# Patient Record
Sex: Female | Born: 1955
Health system: Southern US, Community
[De-identification: ages and names within clinical notes are randomized; demographics above are authoritative.]

## PROBLEM LIST (undated history)

## (undated) DIAGNOSIS — R739 Hyperglycemia, unspecified: Secondary | ICD-10-CM

## (undated) DIAGNOSIS — R319 Hematuria, unspecified: Secondary | ICD-10-CM

## (undated) DIAGNOSIS — N946 Dysmenorrhea, unspecified: Secondary | ICD-10-CM

## (undated) DIAGNOSIS — M8580 Other specified disorders of bone density and structure, unspecified site: Secondary | ICD-10-CM

## (undated) DIAGNOSIS — C4490 Unspecified malignant neoplasm of skin, unspecified: Secondary | ICD-10-CM

## (undated) DIAGNOSIS — D259 Leiomyoma of uterus, unspecified: Secondary | ICD-10-CM

## (undated) DIAGNOSIS — M810 Age-related osteoporosis without current pathological fracture: Secondary | ICD-10-CM

## (undated) DIAGNOSIS — Z124 Encounter for screening for malignant neoplasm of cervix: Secondary | ICD-10-CM

## (undated) DIAGNOSIS — E785 Hyperlipidemia, unspecified: Secondary | ICD-10-CM

## (undated) DIAGNOSIS — I10 Essential (primary) hypertension: Secondary | ICD-10-CM

## (undated) DIAGNOSIS — H6120 Impacted cerumen, unspecified ear: Secondary | ICD-10-CM

## (undated) DIAGNOSIS — T7840XA Allergy, unspecified, initial encounter: Secondary | ICD-10-CM

## (undated) HISTORY — DX: Encounter for screening for malignant neoplasm of cervix: Z12.4

## (undated) HISTORY — DX: Hyperglycemia, unspecified: R73.9

## (undated) HISTORY — DX: Unspecified malignant neoplasm of skin, unspecified: C44.90

## (undated) HISTORY — DX: Essential (primary) hypertension: I10

## (undated) HISTORY — PX: TONSILLECTOMY: SUR1361

## (undated) HISTORY — DX: Impacted cerumen, unspecified ear: H61.20

## (undated) HISTORY — DX: Hematuria, unspecified: R31.9

## (undated) HISTORY — PX: BREAST SURGERY: SHX581

## (undated) HISTORY — DX: Other specified disorders of bone density and structure, unspecified site: M85.80

## (undated) HISTORY — DX: Hyperlipidemia, unspecified: E78.5

## (undated) HISTORY — DX: Allergy, unspecified, initial encounter: T78.40XA

## (undated) HISTORY — DX: Leiomyoma of uterus, unspecified: D25.9

## (undated) HISTORY — DX: Dysmenorrhea, unspecified: N94.6

## (undated) HISTORY — DX: Age-related osteoporosis without current pathological fracture: M81.0

## (undated) HISTORY — PX: GYNECOLOGIC CRYOSURGERY: SHX857

## (undated) HISTORY — PX: COLPOSCOPY: SHX161

---
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
## 1998-07-26 ENCOUNTER — Ambulatory Visit (HOSPITAL_COMMUNITY): Admission: RE | Admit: 1998-07-26 | Discharge: 1998-07-26 | Payer: Self-pay | Admitting: Urology

## 1999-08-31 ENCOUNTER — Other Ambulatory Visit: Admission: RE | Admit: 1999-08-31 | Discharge: 1999-08-31 | Payer: Self-pay | Admitting: Obstetrics & Gynecology

## 2000-10-31 ENCOUNTER — Other Ambulatory Visit: Admission: RE | Admit: 2000-10-31 | Discharge: 2000-10-31 | Payer: Self-pay | Admitting: Obstetrics and Gynecology

## 2001-12-11 ENCOUNTER — Other Ambulatory Visit: Admission: RE | Admit: 2001-12-11 | Discharge: 2001-12-11 | Payer: Self-pay | Admitting: Obstetrics and Gynecology

## 2003-04-01 ENCOUNTER — Other Ambulatory Visit: Admission: RE | Admit: 2003-04-01 | Discharge: 2003-04-01 | Payer: Self-pay | Admitting: Obstetrics and Gynecology

## 2004-09-27 ENCOUNTER — Other Ambulatory Visit: Admission: RE | Admit: 2004-09-27 | Discharge: 2004-09-27 | Payer: Self-pay | Admitting: Obstetrics and Gynecology

## 2005-12-25 ENCOUNTER — Other Ambulatory Visit: Admission: RE | Admit: 2005-12-25 | Discharge: 2005-12-25 | Payer: Self-pay | Admitting: Obstetrics and Gynecology

## 2006-04-05 LAB — HM COLONOSCOPY

## 2008-10-05 ENCOUNTER — Encounter: Payer: Self-pay | Admitting: Internal Medicine

## 2009-01-08 ENCOUNTER — Ambulatory Visit: Payer: Self-pay | Admitting: *Deleted

## 2009-01-22 ENCOUNTER — Ambulatory Visit: Payer: Self-pay | Admitting: *Deleted

## 2009-08-08 ENCOUNTER — Encounter: Payer: Self-pay | Admitting: Internal Medicine

## 2010-01-21 LAB — CONVERTED CEMR LAB: Pap Smear: NORMAL

## 2010-02-07 LAB — HM PAP SMEAR: HM Pap smear: NORMAL

## 2010-03-07 ENCOUNTER — Ambulatory Visit: Payer: Self-pay | Admitting: Internal Medicine

## 2010-03-07 ENCOUNTER — Telehealth: Payer: Self-pay | Admitting: Internal Medicine

## 2010-03-07 DIAGNOSIS — Z85828 Personal history of other malignant neoplasm of skin: Secondary | ICD-10-CM | POA: Insufficient documentation

## 2010-03-07 DIAGNOSIS — D259 Leiomyoma of uterus, unspecified: Secondary | ICD-10-CM | POA: Insufficient documentation

## 2010-03-07 DIAGNOSIS — R030 Elevated blood-pressure reading, without diagnosis of hypertension: Secondary | ICD-10-CM | POA: Insufficient documentation

## 2010-04-11 ENCOUNTER — Ambulatory Visit: Payer: Self-pay | Admitting: Internal Medicine

## 2010-04-11 DIAGNOSIS — M810 Age-related osteoporosis without current pathological fracture: Secondary | ICD-10-CM | POA: Insufficient documentation

## 2010-04-11 HISTORY — DX: Age-related osteoporosis without current pathological fracture: M81.0

## 2010-05-18 ENCOUNTER — Telehealth: Payer: Self-pay | Admitting: Internal Medicine

## 2010-11-15 NOTE — Assessment & Plan Note (Signed)
Summary: new cpx/mhf   Vital Signs:  Patient profile:   55 year old female Height:      63 inches Weight:      135.50 pounds BMI:     24.09 Temp:     98.1 degrees F oral Pulse rate:   84 / minute Pulse rhythm:   regular Resp:     18 per minute BP sitting:   149 / 85  (left arm) Cuff size:   regular  Vitals Entered By: Glendell Docker CMA (Mar 07, 2010 9:29 AM) CC: Rm 2- New Patient    Primary Care Provider:  Dondra Spry DO  CC:  Rm 2- New Patient .  History of Present Illness: 55 y/o white female to establish and for routine cpx borderline elevation in blood pressure, discuss uterine fibroids- Arther Abbott , Candice Camp advised surgery- , fasting for blood work  uterine fibroids - they have continued to grow over last several years hysterectomy  -  laproscopic surgery not feasible no heavy bleeding no urinary symptoms  second opinion from GYN - Dr. Shawnie Pons.  he suggest watchful waiting  got taken off birthcontrol 1-2 yrs ago and took HRT with lower estrogen content denies hot flashes  Preventive Screening-Counseling & Management  Alcohol-Tobacco     Alcohol drinks/day: <1     Alcohol type: wine     Smoking Status: never  Caffeine-Diet-Exercise     Caffeine use/day: 2 cups coffee daily     Does Patient Exercise: yes     Times/week: 3  Allergies (verified): No Known Drug Allergies  Past History:  Past Medical History: Skin cancer, hx of Hypertension Uterine fibroids Osteopenia  Past Surgical History: Breast Biopsy ? 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    Physical Exam  General:  alert, well-developed, and well-nourished.   Head:  normocephalic and atraumatic.   Ears:  R ear normal and L ear normal.   Mouth:  pharynx pink and moist and fair dentition.   Neck:  No deformities, masses, or tenderness noted.no carotid bruits.   Lungs:  Normal respiratory effort, chest expands symmetrically. Lungs are clear to auscultation, no crackles or wheezes. Heart:  Normal rate and regular rhythm. S1 and S2 normal without gallop, murmur, click, rub or other extra sounds. Abdomen:  soft and non-tender.  palpable uterine fibroid Extremities:  No lower extremity edema  Neurologic:  cranial nerves II-XII intact and gait normal.   Psych:  normally interactive, good eye contact, not anxious appearing, and not depressed appearing.     Impression & Recommendations:  Problem # 1:  HEALTH MAINTENANCE EXAM (ICD-V70.0) Reviewed adult health maintenance protocols.  Mammogram: normal (12/07/2009) Pap smear: normal (01/21/2010) Colonoscopy: Done (04/05/2006) Bone Density: abnormal-Osteopenia (09/01/2008) Td Booster: Historical (10/14/2008)     Problem # 2:  ELEVATED BLOOD PRESSURE WITHOUT DIAGNOSIS OF HYPERTENSION (ICD-796.2)  BP  today: 149/85  Instructed in low sodium diet and behavior modification.    Problem # 3:  FIBROIDS, UTERUS (ICD-218.9) Pt to try stopping HRT.   She will f/u GYN within 6 months to reassess - pelvic u/s, then decide whether she will have hysterectomy  Complete Medication List: 1)  Citracal Plus Tabs (Multiple minerals-vitamins) .... Take 1 tablet by mouth once a day 2)  Boniva 150 Mg  Tabs (Ibandronate sodium) .... One tablet by mouth once monthly  Patient Instructions: 1)  Please schedule a follow-up appointment in 6 weeks. 2)  Keep log of BP readings - 10 to 15 readings before your next appt   Preventive Care Screening  Pap Smear:    Date:  01/21/2010    Results:  normal   Mammogram:    Date:  12/07/2009    Results:  normal   Last Tetanus Booster:    Date:  10/14/2008    Results:  Historical   Bone Density:    Date:  09/01/2008    Results:  abnormal-Osteopenia std dev  Colonoscopy:    Date:  04/05/2006    Results:  Done    Current Allergies (reviewed today): No known allergies

## 2010-11-15 NOTE — Progress Notes (Signed)
Summary: BP Log Brought by Patient  BP Log Brought by Patient   Imported By: Lanelle Bal 04/19/2010 10:24:05  _____________________________________________________________________  External Attachment:    Type:   Image     Comment:   External Document

## 2010-11-15 NOTE — Progress Notes (Signed)
Summary: request to [facility] for Medical Records   Phone Note Outgoing Call   Call placed by: Marj Call placed to: [facility]  Summary of Call: form faxed to [facility] to get medical records  Initial call taken by: Roselle Locus,  Mar 07, 2010 9:37 AM

## 2010-11-15 NOTE — Progress Notes (Signed)
Summary: Swollen eye  Phone Note Call from Patient Call back at [phone]   Caller: Patient Reason for Call: Talk to Nurse Summary of Call: Pt not sure if she needs OV with [place] or with eye doc, her eye is swollen & painful but is not pink and doesn't have drainage typical of pink eye, pls call her to advise Initial call taken by: Lannette Donath,  May 18, 2010 9:18 AM  Follow-up for Phone Call        call returned to patient  she states she has remedy her eye problem,  she waas able to get a appointment with her eye doctor this afternoon.  Follow-up by: Glendell Docker CMA,  May 18, 2010 10:41 AM

## 2010-11-15 NOTE — Letter (Signed)
Summary: [facility]   Imported By: Lanelle Bal 03/29/2010 11:33:58  _____________________________________________________________________  External Attachment:    Type:   Image     Comment:   External Document

## 2010-11-15 NOTE — Assessment & Plan Note (Signed)
Summary: 6 week follow up/mhf   Vital Signs:  Patient profile:   55 year old female Height:      63 inches Weight:      135.25 pounds BMI:     24.05 O2 Sat:      98 % on Room air Temp:     97.9 degrees F oral Pulse rate:   94 / minute Pulse rhythm:   regular Resp:     18 per minute BP sitting:   126 / 80  (right arm) Cuff size:   regular  Vitals Entered By: Glendell Docker CMA (April 11, 2010 8:04 AM)  O2 Flow:  Room air CC: Rm 2- 6 Week Follow up Comments trouble staying asleep during the night,3 episodes in the past week, medications reviewed   Primary Care Provider:  DThomos Lemons DO  CC:  Rm 2- 6 Week Follow up.  History of Present Illness: 55 y/o with elevated BP for f/u home BP readings normal - 14 readings avg 115/80 last visit, pt had root canal and took some NSAIDs denies any other supplement use  stopped HRT no hot flashes but noticed intermittent insomnia no obvious trigger  reviewed health screening medical records DEXA scan 2010 - left femoral neck -1.5, lumbar -1.9  Allergies (verified): No Known Drug Allergies  Past History:  Past Medical History: Skin cancer, hx of Hypertension Uterine fibroids   Osteopenia chronic cerumen impaction  Past Surgical History: Breast Biopsy ? 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   Ears:  bilateral cerumen  Lungs:  Normal respiratory effort, chest expands symmetrically. Lungs are clear to  auscultation, no crackles or wheezes. Heart:  Normal rate and regular rhythm. S1 and S2 normal without gallop, murmur, click, rub or other extra sounds.   Impression & Recommendations:  Problem # 1:  ELEVATED BLOOD PRESSURE WITHOUT DIAGNOSIS OF HYPERTENSION (ICD-796.2) Assessment Improved prev bp elevation may have been related to NSAID use.  home readings normal  BP today: 126/80 Prior BP: 149/85 (03/07/2010)  Problem # 2:  OSTEOPENIA (ICD-733.90) DEXA scan 2010 - left femoral neck -1.5, lumbar -1.9 Her updated medication list for this problem includes:    Boniva 150 Mg Tabs (Ibandronate sodium) ..... One tablet by mouth once monthly  continue bisphosphonate and calcium and vit d supplement  Complete Medication List: 1)  Citracal Plus Tabs (Multiple minerals-vitamins) .... Take 1 tablet by mouth once a day 2)  Boniva 150 Mg Tabs (Ibandronate sodium) .... One tablet by mouth once monthly  Patient Instructions: 1)  Please schedule a follow-up appointment in 1 year.  Current Allergies (reviewed today): No known allergies

## 2010-11-15 NOTE — Letter (Signed)
Summary: Health Screening/National Health & Nutrition Exam  Health Screening/National Health & Nutrition Exam   Imported By: Lanelle Bal 04/19/2010 10:23:17  _____________________________________________________________________  External Attachment:    Type:   Image     Comment:   External Document

## 2011-03-29 ENCOUNTER — Telehealth: Payer: Self-pay | Admitting: Internal Medicine

## 2011-03-29 NOTE — Telephone Encounter (Signed)
She wants to know if she should keep taking the boniva and calcium supplements with vitamin d every.  The new study says this increases the risk of heart attacks.  She is not taking hormone replacement therapy and she is having increased hot flash.  Should she take some hormone replacement therapy.

## 2011-04-07 NOTE — Telephone Encounter (Signed)
I don't know of any cardiac risks with boniva. As far as recommending anything about her meds, I know nothing that would make me advise her to stop taking them at this time.  She has not followed up in a year.  Maybe she would want to come in to discuss her osteoporosis, menopause, and her treatment plan....she could also bring in the article she is referring to so I could read it and possibly provide clarity.  Thanks--PM

## 2011-04-07 NOTE — Telephone Encounter (Signed)
Call placed to patient at [phone], no answer. A detailed voice message was left informing patient per Dr Milinda Cave instructions. Patient advised to call back to schedule office visit

## 2011-06-06 ENCOUNTER — Ambulatory Visit (INDEPENDENT_AMBULATORY_CARE_PROVIDER_SITE_OTHER): Payer: 59 | Admitting: Internal Medicine

## 2011-06-06 ENCOUNTER — Encounter: Payer: Self-pay | Admitting: Internal Medicine

## 2011-06-06 VITALS — BP 118/68 | HR 91 | Temp 98.1°F | Resp 16 | Ht 63.0 in | Wt 132.0 lb

## 2011-06-06 DIAGNOSIS — R109 Unspecified abdominal pain: Secondary | ICD-10-CM | POA: Insufficient documentation

## 2011-06-06 LAB — HEPATIC FUNCTION PANEL
AST: 22 U/L (ref 0–37)
Albumin: 4.3 g/dL (ref 3.5–5.2)
Bilirubin, Direct: 0.1 mg/dL (ref 0.0–0.3)
Total Bilirubin: 0.6 mg/dL (ref 0.3–1.2)

## 2011-06-06 LAB — CBC WITH DIFFERENTIAL/PLATELET
Basophils Absolute: 0 10*3/uL (ref 0.0–0.1)
Basophils Relative: 0 % (ref 0–1)
Eosinophils Absolute: 0 10*3/uL (ref 0.0–0.7)
MCH: 31.1 pg (ref 26.0–34.0)
MCHC: 33 g/dL (ref 30.0–36.0)
Monocytes Relative: 8 % (ref 3–12)
Neutro Abs: 3.7 10*3/uL (ref 1.7–7.7)
Neutrophils Relative %: 76 % (ref 43–77)
RDW: 12.5 % (ref 11.5–15.5)

## 2011-06-06 LAB — BASIC METABOLIC PANEL
BUN: 15 mg/dL (ref 6–23)
Calcium: 9.4 mg/dL (ref 8.4–10.5)
Glucose, Bld: 104 mg/dL — ABNORMAL HIGH (ref 70–99)
Potassium: 4 mEq/L (ref 3.5–5.3)
Sodium: 142 mEq/L (ref 135–145)

## 2011-06-06 LAB — URINALYSIS, ROUTINE W REFLEX MICROSCOPIC
Leukocytes, UA: NEGATIVE
Nitrite: NEGATIVE
Specific Gravity, Urine: 1.023 (ref 1.005–1.030)
pH: 5 (ref 5.0–8.0)

## 2011-06-06 LAB — LIPASE: Lipase: 14 U/L (ref 0–75)

## 2011-06-06 LAB — AMYLASE: Amylase: 48 U/L (ref 0–105)

## 2011-06-06 NOTE — Progress Notes (Signed)
  Subjective:    Patient ID: Yolanda Hernandez, female    DOB: October 10, 1956, 55 y.o.   MRN: [id number]  HPI Pt presents to clinic as a work in for evaluation of  Abdominal discomfort. Notes intermitent periumbilical to hypogastric abdomina pain without radiation or relationship to food. No associated n/v/d. +fatigue, possible LG temp (99-100 orally) and decreased appetite. Sx's persist for a few days then resolve spontaneously. Has had 2 similar episodes and the first involved mild lbp as well. Denies urinary sx' such as frequency, hematuria, urgency or dysuria. No other exacerbating or alleviating factors. Taking no medications for this problem. No other complaints.  Past Medical History  Diagnosis Date  . Skin cancer   . Hypertension   . Uterine fibroid   . Osteopenia   . Cerumen impaction     chronic   Past Surgical History  Procedure Date  . Breast surgery     biopsy left  . Myringoplasty 1972  . Tonsillectomy     reports that she has never smoked. She has never used smokeless tobacco. She reports that she drinks alcohol. She reports that she does not use illicit drugs. family history includes Alcohol abuse in her other; Diabetes in her father and sister; Heart disease in her father; Hyperlipidemia in her father and mother; Hypertension in her father and mother; Kidney disease in her other; and Osteoporosis in her mother. No Known Allergies   Review of Systems see hpi     Objective:   Physical Exam  Nursing note and vitals reviewed. Constitutional: She appears well-developed and well-nourished. No distress.  HENT:  Head: Normocephalic and atraumatic.  Eyes: Conjunctivae are normal. No scleral icterus.  Abdominal: Soft. Bowel sounds are normal. She exhibits no distension and no mass. There is no hepatosplenomegaly. There is tenderness. There is no rebound and no guarding.       Mild discomfort to palpation hypogastric area.  Skin: She is not diaphoretic.          Assessment  & Plan:

## 2011-06-06 NOTE — Assessment & Plan Note (Signed)
Obtain cbc, chem7, lft, amylase, lipase, and UA. Ten day empiric course of ppi with samples of nexium 40mg  po qd provided. Followup if no improvement or worsening.

## 2011-06-07 ENCOUNTER — Telehealth: Payer: Self-pay | Admitting: *Deleted

## 2011-06-07 NOTE — Telephone Encounter (Signed)
Patient called and left voice message stating she was seen yesterday , and since then she has had 7 bouts of  Clay colored diarrhea.   Call was returned to patient at [phone]. She states she woke up with diarrhea, and has had it several times throughout the day. She denies having any other symptoms.  She wanted to know if her labs were back. She was informed Dr Rodena Medin was out of the office this afternoon, and that I would call her in the morning with his recommendations. She was advised to use immodium over the counter as package directs for relief. Patient has verbalized understanding and agrees.

## 2011-06-08 MED ORDER — METRONIDAZOLE 500 MG PO TABS: 500.0000 mg | ORAL_TABLET | Freq: Three times a day (TID) | ORAL | Status: AC

## 2011-06-08 NOTE — Telephone Encounter (Signed)
Labs nl. Can take brief course of flagyl 500mg  tid x 5days. otc probiotic. However still recommend gi if sx's persist

## 2011-06-08 NOTE — Telephone Encounter (Signed)
Call returned from patient, voice message left stating should would like Rx sent to [facility]. Her message stated that she has never used a probiotic before and that she will check with the pharmacist for over the counter suggestion.

## 2011-06-08 NOTE — Telephone Encounter (Signed)
Call placed to patient at [phone], no answer. A detailed voice message was left informing patient per Dr Rodena Medin instruction. Message was left for patient to return call regarding where she would like the Rx sent to.

## 2011-06-09 ENCOUNTER — Ambulatory Visit: Payer: Self-pay | Admitting: Internal Medicine

## 2011-08-23 ENCOUNTER — Telehealth: Payer: Self-pay | Admitting: Internal Medicine

## 2011-08-23 DIAGNOSIS — Z1329 Encounter for screening for other suspected endocrine disorder: Secondary | ICD-10-CM

## 2011-08-23 DIAGNOSIS — Z13220 Encounter for screening for lipoid disorders: Secondary | ICD-10-CM

## 2011-08-23 DIAGNOSIS — Z0000 Encounter for general adult medical examination without abnormal findings: Secondary | ICD-10-CM

## 2011-08-23 DIAGNOSIS — M899 Disorder of bone, unspecified: Secondary | ICD-10-CM

## 2011-08-23 NOTE — Telephone Encounter (Signed)
Lab orders entered for [facility]. Call placed to patient at [phone], no answer. A voice message was left for patient to return phone call.

## 2011-08-23 NOTE — Telephone Encounter (Signed)
Had extensive labs 8/21. There is already an order for future labs for the remainder of what would be needed

## 2011-08-23 NOTE — Telephone Encounter (Signed)
She is scheduled for cpe on 11-12 with Dr Rodena Medin.  Would like to do labs prior to appt.  Please advise patient when order is in place

## 2011-08-23 NOTE — Telephone Encounter (Signed)
Dr Rodena Medin what labs will be needed for November Physical?

## 2011-08-24 NOTE — Telephone Encounter (Signed)
Patient returned phone call and left voice to contact her at [phone]. Call was returned to patient at requested phone number. She was informed of fasting blood work. She stated that she will have labs drawn on Monday.

## 2011-08-29 LAB — LIPID PANEL
Cholesterol: 208 mg/dL — ABNORMAL HIGH (ref 0–200)
HDL: 48 mg/dL (ref >39)
Triglycerides: 117 mg/dL (ref <150)

## 2011-08-30 LAB — URINALYSIS, MICROSCOPIC ONLY
Bacteria, UA: NONE SEEN
Squamous Epithelial / LPF: NONE SEEN

## 2011-08-30 LAB — URINALYSIS, ROUTINE W REFLEX MICROSCOPIC
Bilirubin Urine: NEGATIVE
Ketones, ur: NEGATIVE mg/dL
Specific Gravity, Urine: 1.013 (ref 1.005–1.030)
pH: 7.5 (ref 5.0–8.0)

## 2011-09-04 ENCOUNTER — Encounter: Payer: Self-pay | Admitting: Internal Medicine

## 2011-09-04 ENCOUNTER — Ambulatory Visit (INDEPENDENT_AMBULATORY_CARE_PROVIDER_SITE_OTHER): Payer: 59 | Admitting: Internal Medicine

## 2011-09-04 VITALS — BP 126/80 | HR 94 | Temp 97.8°F | Resp 18 | Ht 63.0 in | Wt 133.0 lb

## 2011-09-04 DIAGNOSIS — M21959 Unspecified acquired deformity of unspecified thigh: Secondary | ICD-10-CM

## 2011-09-04 DIAGNOSIS — M25659 Stiffness of unspecified hip, not elsewhere classified: Secondary | ICD-10-CM

## 2011-09-04 DIAGNOSIS — Z0000 Encounter for general adult medical examination without abnormal findings: Secondary | ICD-10-CM

## 2011-09-04 NOTE — Progress Notes (Signed)
  Subjective:    Patient ID: Yolanda Hernandez, female    DOB: 11-15-1955, 55 y.o.   MRN: [id number]  HPI Pt presents to clinic for annual exam. utd with pap smears and bmd through gyn. Notes 24m h/o mildly decreased ROM of right hip flexor. No associated pain or injury. No other complaints.  Past Medical History  Diagnosis Date  . Skin cancer   . Hypertension   . Uterine fibroid   . Osteopenia   . Cerumen impaction     chronic   Past Surgical History  Procedure Date  . Breast surgery     biopsy left  . Myringoplasty 1972  . Tonsillectomy     reports that she has never smoked. She has never used smokeless tobacco. She reports that she drinks alcohol. She reports that she does not use illicit drugs. family history includes Alcohol abuse in her other; Diabetes in her father and sister; Heart disease in her father; Hyperlipidemia in her father and mother; Hypertension in her father and mother; Kidney disease in her other; and Osteoporosis in her mother. No Known Allergies     Review of Systems see hpi     Objective:   Physical Exam  Nursing note and vitals reviewed. Constitutional: She appears well-developed and well-nourished. No distress.  HENT:  Head: Normocephalic and atraumatic.  Right Ear: Tympanic membrane, external ear and ear canal normal.  Left Ear: Tympanic membrane, external ear and ear canal normal.  Nose: Nose normal.  Mouth/Throat: Oropharynx is clear and moist. No oropharyngeal exudate.  Eyes: Conjunctivae and EOM are normal. Pupils are equal, round, and reactive to light. Right eye exhibits no discharge. Left eye exhibits no discharge. No scleral icterus.  Neck: Neck supple. No JVD present. No thyromegaly present.  Cardiovascular: Normal rate, regular rhythm, normal heart sounds and intact distal pulses.  Exam reveals no gallop and no friction rub.   No murmur heard. Pulmonary/Chest: Effort normal and breath sounds normal. No respiratory distress. She has no  wheezes. She has no rales.  Abdominal: Soft. Normal appearance and bowel sounds are normal. She exhibits no distension and no mass. There is no hepatosplenomegaly. There is no tenderness. There is no rebound and no guarding.  Musculoskeletal: Normal range of motion.  Lymphadenopathy:    She has no cervical adenopathy.  Neurological: She is alert.  Skin: Skin is warm and dry. No rash noted. She is not diaphoretic.  Psychiatric: She has a normal mood and affect.          Assessment & Plan:

## 2011-09-04 NOTE — Patient Instructions (Signed)
Please schedule fasting lipid prior to next visit

## 2011-09-05 ENCOUNTER — Ambulatory Visit: Payer: 59 | Attending: Internal Medicine | Admitting: Physical Therapy

## 2011-09-05 DIAGNOSIS — IMO0001 Reserved for inherently not codable concepts without codable children: Secondary | ICD-10-CM | POA: Insufficient documentation

## 2011-09-05 DIAGNOSIS — M25559 Pain in unspecified hip: Secondary | ICD-10-CM | POA: Insufficient documentation

## 2011-09-05 DIAGNOSIS — M25659 Stiffness of unspecified hip, not elsewhere classified: Secondary | ICD-10-CM | POA: Insufficient documentation

## 2011-09-08 DIAGNOSIS — Z0000 Encounter for general adult medical examination without abnormal findings: Secondary | ICD-10-CM

## 2011-09-08 DIAGNOSIS — M25659 Stiffness of unspecified hip, not elsewhere classified: Secondary | ICD-10-CM | POA: Insufficient documentation

## 2011-09-08 HISTORY — DX: Encounter for general adult medical examination without abnormal findings: Z00.00

## 2011-09-08 NOTE — Assessment & Plan Note (Signed)
PT referral.

## 2011-09-08 NOTE — Assessment & Plan Note (Signed)
Nl exam. Colonoscopy pending. Labs reviewed. Low fat diet/exercise.

## 2011-09-15 ENCOUNTER — Ambulatory Visit: Payer: 59 | Admitting: Physical Therapy

## 2011-09-20 ENCOUNTER — Ambulatory Visit: Payer: 59 | Admitting: Physical Therapy

## 2011-09-26 ENCOUNTER — Ambulatory Visit: Payer: 59 | Attending: Internal Medicine | Admitting: Physical Therapy

## 2011-09-26 DIAGNOSIS — IMO0001 Reserved for inherently not codable concepts without codable children: Secondary | ICD-10-CM | POA: Insufficient documentation

## 2011-09-26 DIAGNOSIS — M25559 Pain in unspecified hip: Secondary | ICD-10-CM | POA: Insufficient documentation

## 2011-09-26 DIAGNOSIS — M25659 Stiffness of unspecified hip, not elsewhere classified: Secondary | ICD-10-CM | POA: Insufficient documentation

## 2011-10-03 ENCOUNTER — Ambulatory Visit: Payer: 59 | Admitting: Physical Therapy

## 2011-11-01 LAB — HM PAP SMEAR: HM Pap smear: NORMAL

## 2013-01-20 ENCOUNTER — Telehealth: Payer: Self-pay

## 2013-01-20 NOTE — Telephone Encounter (Signed)
Pt left a message stating that she would like a referral for bone density preferably somewhere in [place]? Pt stated she hasn't been seen since 09-04-11 ([place] wanted pt to return in 6 months). Pt stated on vm she would come in for an appt if needed.  Pt informed and states she would like to make an appt but then stated she was in the car she would call back to make on

## 2013-03-19 ENCOUNTER — Encounter: Payer: Self-pay | Admitting: Internal Medicine

## 2013-03-19 ENCOUNTER — Ambulatory Visit (INDEPENDENT_AMBULATORY_CARE_PROVIDER_SITE_OTHER): Payer: 59 | Admitting: Family Medicine

## 2013-03-19 ENCOUNTER — Encounter: Payer: Self-pay | Admitting: Family Medicine

## 2013-03-19 VITALS — BP 120/78 | HR 86 | Temp 98.2°F | Ht 63.0 in | Wt 133.0 lb

## 2013-03-19 DIAGNOSIS — Z0000 Encounter for general adult medical examination without abnormal findings: Secondary | ICD-10-CM

## 2013-03-19 DIAGNOSIS — R739 Hyperglycemia, unspecified: Secondary | ICD-10-CM

## 2013-03-19 DIAGNOSIS — M899 Disorder of bone, unspecified: Secondary | ICD-10-CM

## 2013-03-19 DIAGNOSIS — E785 Hyperlipidemia, unspecified: Secondary | ICD-10-CM

## 2013-03-19 DIAGNOSIS — IMO0001 Reserved for inherently not codable concepts without codable children: Secondary | ICD-10-CM

## 2013-03-19 DIAGNOSIS — R7309 Other abnormal glucose: Secondary | ICD-10-CM

## 2013-03-19 DIAGNOSIS — M8580 Other specified disorders of bone density and structure, unspecified site: Secondary | ICD-10-CM | POA: Insufficient documentation

## 2013-03-19 DIAGNOSIS — E782 Mixed hyperlipidemia: Secondary | ICD-10-CM | POA: Insufficient documentation

## 2013-03-19 DIAGNOSIS — R030 Elevated blood-pressure reading, without diagnosis of hypertension: Secondary | ICD-10-CM

## 2013-03-19 HISTORY — DX: Hyperglycemia, unspecified: R73.9

## 2013-03-19 HISTORY — DX: Hyperlipidemia, unspecified: E78.5

## 2013-03-19 LAB — HEPATIC FUNCTION PANEL
Bilirubin, Direct: 0.1 mg/dL (ref 0.0–0.3)
Indirect Bilirubin: 0.7 mg/dL (ref 0.0–0.9)
Total Bilirubin: 0.8 mg/dL (ref 0.3–1.2)

## 2013-03-19 LAB — CBC
Hemoglobin: 15.5 g/dL — ABNORMAL HIGH (ref 12.0–15.0)
MCH: 30.6 pg (ref 26.0–34.0)
Platelets: 356 10*3/uL (ref 150–400)
RBC: 5.07 MIL/uL (ref 3.87–5.11)

## 2013-03-19 LAB — RENAL FUNCTION PANEL
Albumin: 4.7 g/dL (ref 3.5–5.2)
Calcium: 9.6 mg/dL (ref 8.4–10.5)
Phosphorus: 3.1 mg/dL (ref 2.3–4.6)
Potassium: 4.5 mEq/L (ref 3.5–5.3)
Sodium: 142 mEq/L (ref 135–145)

## 2013-03-19 LAB — LIPID PANEL
Cholesterol: 222 mg/dL — ABNORMAL HIGH (ref 0–200)
Total CHOL/HDL Ratio: 4.3 Ratio
Triglycerides: 105 mg/dL (ref <150)
VLDL: 21 mg/dL (ref 0–40)

## 2013-03-19 LAB — HEMOGLOBIN A1C
Hgb A1c MFr Bld: 5.5 % (ref <5.7)
Mean Plasma Glucose: 111 mg/dL (ref <117)

## 2013-03-19 LAB — TSH: TSH: 3.141 u[IU]/mL (ref 0.350–4.500)

## 2013-03-19 NOTE — Patient Instructions (Addendum)
Start krill oil caps daily, Mega[employer]  Annual exam lipid, renal, cbc, hepatic, tsh, hgba1c, lipid  Preventive Care for Adults, Female A healthy lifestyle and preventive care can promote health and wellness. Preventive health guidelines for women include the following key practices.  A routine yearly physical is a good way to check with your caregiver about your health and preventive screening. It is a chance to share any concerns and updates on your health, and to receive a thorough exam.  Visit your dentist for a routine exam and preventive care every 6 months. Brush your teeth twice a day and floss once a day. Good oral hygiene prevents tooth decay and gum disease.  The frequency of eye exams is based on your age, health, family medical history, use of contact lenses, and other factors. Follow your caregiver's recommendations for frequency of eye exams.  Eat a healthy diet. Foods like vegetables, fruits, whole grains, low-fat dairy products, and lean protein foods contain the nutrients you need without too many calories. Decrease your intake of foods high in solid fats, added sugars, and salt. Eat the right amount of calories for you.Get information about a proper diet from your caregiver, if necessary.  Regular physical exercise is one of the most important things you can do for your health. Most adults should get at least 150 minutes of moderate-intensity exercise (any activity that increases your heart rate and causes you to sweat) each week. In addition, most adults need muscle-strengthening exercises on 2 or more days a week.  Maintain a healthy weight. The body mass index (BMI) is a screening tool to identify possible weight problems. It provides an estimate of body fat based on height and weight. Your caregiver can help determine your BMI, and can help you achieve or maintain a healthy weight.For adults 20 years and older:  A BMI below 18.5 is considered underweight.  A BMI of 18.5 to  24.9 is normal.  A BMI of 25 to 29.9 is considered overweight.  A BMI of 30 and above is considered obese.  Maintain normal blood lipids and cholesterol levels by exercising and minimizing your intake of saturated fat. Eat a balanced diet with plenty of fruit and vegetables. Blood tests for lipids and cholesterol should begin at age 35 and be repeated every 5 years. If your lipid or cholesterol levels are high, you are over 50, or you are at high risk for heart disease, you may need your cholesterol levels checked more frequently.Ongoing high lipid and cholesterol levels should be treated with medicines if diet and exercise are not effective.  If you smoke, find out from your caregiver how to quit. If you do not use tobacco, do not start.  If you are pregnant, do not drink alcohol. If you are breastfeeding, be very cautious about drinking alcohol. If you are not pregnant and choose to drink alcohol, do not exceed 1 drink per day. One drink is considered to be 12 ounces (355 mL) of beer, 5 ounces (148 mL) of wine, or 1.5 ounces (44 mL) of liquor.  Avoid use of street drugs. Do not share needles with anyone. Ask for help if you need support or instructions about stopping the use of drugs.  High blood pressure causes heart disease and increases the risk of stroke. Your blood pressure should be checked at least every 1 to 2 years. Ongoing high blood pressure should be treated with medicines if weight loss and exercise are not effective.  If you are 65  to 58 years old, ask your caregiver if you should take aspirin to prevent strokes.  Diabetes screening involves taking a blood sample to check your fasting blood sugar level. This should be done once every 3 years, after age 33, if you are within normal weight and without risk factors for diabetes. Testing should be considered at a younger age or be carried out more frequently if you are overweight and have at least 1 risk factor for diabetes.  Breast  cancer screening is essential preventive care for women. You should practice "breast self-awareness." This means understanding the normal appearance and feel of your breasts and may include breast self-examination. Any changes detected, no matter how small, should be reported to a caregiver. Women in their 64s and 30s should have a clinical breast exam (CBE) by a caregiver as part of a regular health exam every 1 to 3 years. After age 14, women should have a CBE every year. Starting at age 73, women should consider having a mammography (breast X-ray test) every year. Women who have a family history of breast cancer should talk to their caregiver about genetic screening. Women at a high risk of breast cancer should talk to their caregivers about having magnetic resonance imaging (MRI) and a mammography every year.  The Pap test is a screening test for cervical cancer. A Pap test can show cell changes on the cervix that might become cervical cancer if left untreated. A Pap test is a procedure in which cells are obtained and examined from the lower end of the uterus (cervix).  Women should have a Pap test starting at age 64.  Between ages 55 and 79, Pap tests should be repeated every 2 years.  Beginning at age 41, you should have a Pap test every 3 years as long as the past 3 Pap tests have been normal.  Some women have medical problems that increase the chance of getting cervical cancer. Talk to your caregiver about these problems. It is especially important to talk to your caregiver if a new problem develops soon after your last Pap test. In these cases, your caregiver may recommend more frequent screening and Pap tests.  The above recommendations are the same for women who have or have not gotten the vaccine for human papillomavirus (HPV).  If you had a hysterectomy for a problem that was not cancer or a condition that could lead to cancer, then you no longer need Pap tests. Even if you no longer need  a Pap test, a regular exam is a good idea to make sure no other problems are starting.  If you are between ages 73 and 38, and you have had normal Pap tests going back 10 years, you no longer need Pap tests. Even if you no longer need a Pap test, a regular exam is a good idea to make sure no other problems are starting.  If you have had past treatment for cervical cancer or a condition that could lead to cancer, you need Pap tests and screening for cancer for at least 20 years after your treatment.  If Pap tests have been discontinued, risk factors (such as a new sexual partner) need to be reassessed to determine if screening should be resumed.  The HPV test is an additional test that may be used for cervical cancer screening. The HPV test looks for the virus that can cause the cell changes on the cervix. The cells collected during the Pap test can be tested for  HPV. The HPV test could be used to screen women aged 25 years and older, and should be used in women of any age who have unclear Pap test results. After the age of 8, women should have HPV testing at the same frequency as a Pap test.  Colorectal cancer can be detected and often prevented. Most routine colorectal cancer screening begins at the age of 4 and continues through age 59. However, your caregiver may recommend screening at an earlier age if you have risk factors for colon cancer. On a yearly basis, your caregiver may provide home test kits to check for hidden blood in the stool. Use of a small camera at the end of a tube, to directly examine the colon (sigmoidoscopy or colonoscopy), can detect the earliest forms of colorectal cancer. Talk to your caregiver about this at age 61, when routine screening begins. Direct examination of the colon should be repeated every 5 to 10 years through age 37, unless early forms of pre-cancerous polyps or small growths are found.  Hepatitis C blood testing is recommended for all people born from [birth year]  through 1965 and any individual with known risks for hepatitis C.  Practice safe sex. Use condoms and avoid high-risk sexual practices to reduce the spread of sexually transmitted infections (STIs). STIs include gonorrhea, chlamydia, syphilis, trichomonas, herpes, HPV, and human immunodeficiency virus (HIV). Herpes, HIV, and HPV are viral illnesses that have no cure. They can result in disability, cancer, and death. Sexually active women aged 8 and younger should be checked for chlamydia. Older women with new or multiple partners should also be tested for chlamydia. Testing for other STIs is recommended if you are sexually active and at increased risk.  Osteoporosis is a disease in which the bones lose minerals and strength with aging. This can result in serious bone fractures. The risk of osteoporosis can be identified using a bone density scan. Women ages 4 and over and women at risk for fractures or osteoporosis should discuss screening with their caregivers. Ask your caregiver whether you should take a calcium supplement or vitamin D to reduce the rate of osteoporosis.  Menopause can be associated with physical symptoms and risks. Hormone replacement therapy is available to decrease symptoms and risks. You should talk to your caregiver about whether hormone replacement therapy is right for you.  Use sunscreen with sun protection factor (SPF) of 30 or more. Apply sunscreen liberally and repeatedly throughout the day. You should seek shade when your shadow is shorter than you. Protect yourself by wearing long sleeves, pants, a wide-brimmed hat, and sunglasses year round, whenever you are outdoors.  Once a month, do a whole body skin exam, using a mirror to look at the skin on your back. Notify your caregiver of new moles, moles that have irregular borders, moles that are larger than a pencil eraser, or moles that have changed in shape or color.  Stay current with required immunizations.  Influenza.  You need a dose every fall (or winter). The composition of the flu vaccine changes each year, so being vaccinated once is not enough.  Pneumococcal polysaccharide. You need 1 to 2 doses if you smoke cigarettes or if you have certain chronic medical conditions. You need 1 dose at age 47 (or older) if you have never been vaccinated.  Tetanus, diphtheria, pertussis (Tdap, Td). Get 1 dose of Tdap vaccine if you are younger than age 71, are over 41 and have contact with an infant, are a Research scientist (physical sciences),  are pregnant, or simply want to be protected from whooping cough. After that, you need a Td booster dose every 10 years. Consult your caregiver if you have not had at least 3 tetanus and diphtheria-containing shots sometime in your life or have a deep or dirty wound.  HPV. You need this vaccine if you are a woman age 7 or younger. The vaccine is given in 3 doses over 6 months.  Measles, mumps, rubella (MMR). You need at least 1 dose of MMR if you were born in 1957 or later. You may also need a second dose.  Meningococcal. If you are age 73 to 51 and a first-year college student living in a residence hall, or have one of several medical conditions, you need to get vaccinated against meningococcal disease. You may also need additional booster doses.  Zoster (shingles). If you are age 30 or older, you should get this vaccine.  Varicella (chickenpox). If you have never had chickenpox or you were vaccinated but received only 1 dose, talk to your caregiver to find out if you need this vaccine.  Hepatitis A. You need this vaccine if you have a specific risk factor for hepatitis A virus infection or you simply wish to be protected from this disease. The vaccine is usually given as 2 doses, 6 to 18 months apart.  Hepatitis B. You need this vaccine if you have a specific risk factor for hepatitis B virus infection or you simply wish to be protected from this disease. The vaccine is given in 3 doses, usually  over 6 months. Preventive Services / Frequency Ages 16 to 90  Blood pressure check.** / Every 1 to 2 years.  Lipid and cholesterol check.** / Every 5 years beginning at age 10.  Clinical breast exam.** / Every 3 years for women in their 93s and 30s.  Pap test.** / Every 2 years from ages 9 through 20. Every 3 years starting at age 90 through age 64 or 35 with a history of 3 consecutive normal Pap tests.  HPV screening.** / Every 3 years from ages 73 through ages 82 to 52 with a history of 3 consecutive normal Pap tests.  Hepatitis C blood test.** / For any individual with known risks for hepatitis C.  Skin self-exam. / Monthly.  Influenza immunization.** / Every year.  Pneumococcal polysaccharide immunization.** / 1 to 2 doses if you smoke cigarettes or if you have certain chronic medical conditions.  Tetanus, diphtheria, pertussis (Tdap, Td) immunization. / A one-time dose of Tdap vaccine. After that, you need a Td booster dose every 10 years.  HPV immunization. / 3 doses over 6 months, if you are 57 and younger.  Measles, mumps, rubella (MMR) immunization. / You need at least 1 dose of MMR if you were born in 1957 or later. You may also need a second dose.  Meningococcal immunization. / 1 dose if you are age 15 to 28 and a first-year college student living in a residence hall, or have one of several medical conditions, you need to get vaccinated against meningococcal disease. You may also need additional booster doses.  Varicella immunization.** / Consult your caregiver.  Hepatitis A immunization.** / Consult your caregiver. 2 doses, 6 to 18 months apart.  Hepatitis B immunization.** / Consult your caregiver. 3 doses usually over 6 months. Ages 26 to 55  Blood pressure check.** / Every 1 to 2 years.  Lipid and cholesterol check.** / Every 5 years beginning at age 22.  Clinical  breast exam.** / Every year after age 7.  Mammogram.** / Every year beginning at age 23 and  continuing for as long as you are in good health. Consult with your caregiver.  Pap test.** / Every 3 years starting at age 6 through age 56 or 71 with a history of 3 consecutive normal Pap tests.  HPV screening.** / Every 3 years from ages 74 through ages 64 to 38 with a history of 3 consecutive normal Pap tests.  Fecal occult blood test (FOBT) of stool. / Every year beginning at age 68 and continuing until age 106. You may not need to do this test if you get a colonoscopy every 10 years.  Flexible sigmoidoscopy or colonoscopy.** / Every 5 years for a flexible sigmoidoscopy or every 10 years for a colonoscopy beginning at age 21 and continuing until age 3.  Hepatitis C blood test.** / For all people born from [birth year] through 1965 and any individual with known risks for hepatitis C.  Skin self-exam. / Monthly.  Influenza immunization.** / Every year.  Pneumococcal polysaccharide immunization.** / 1 to 2 doses if you smoke cigarettes or if you have certain chronic medical conditions.  Tetanus, diphtheria, pertussis (Tdap, Td) immunization.** / A one-time dose of Tdap vaccine. After that, you need a Td booster dose every 10 years.  Measles, mumps, rubella (MMR) immunization. / You need at least 1 dose of MMR if you were born in 1957 or later. You may also need a second dose.  Varicella immunization.** / Consult your caregiver.  Meningococcal immunization.** / Consult your caregiver.  Hepatitis A immunization.** / Consult your caregiver. 2 doses, 6 to 18 months apart.  Hepatitis B immunization.** / Consult your caregiver. 3 doses, usually over 6 months. Ages 61 and over  Blood pressure check.** / Every 1 to 2 years.  Lipid and cholesterol check.** / Every 5 years beginning at age 33.  Clinical breast exam.** / Every year after age 55.  Mammogram.** / Every year beginning at age 69 and continuing for as long as you are in good health. Consult with your caregiver.  Pap test.** / Every  3 years starting at age 21 through age 71 or 42 with a 3 consecutive normal Pap tests. Testing can be stopped between 65 and 70 with 3 consecutive normal Pap tests and no abnormal Pap or HPV tests in the past 10 years.  HPV screening.** / Every 3 years from ages 63 through ages 24 or 49 with a history of 3 consecutive normal Pap tests. Testing can be stopped between 65 and 70 with 3 consecutive normal Pap tests and no abnormal Pap or HPV tests in the past 10 years.  Fecal occult blood test (FOBT) of stool. / Every year beginning at age 43 and continuing until age 65. You may not need to do this test if you get a colonoscopy every 10 years.  Flexible sigmoidoscopy or colonoscopy.** / Every 5 years for a flexible sigmoidoscopy or every 10 years for a colonoscopy beginning at age 72 and continuing until age 80.  Hepatitis C blood test.** / For all people born from [birth year] through 1965 and any individual with known risks for hepatitis C.  Osteoporosis screening.** / A one-time screening for women ages 73 and over and women at risk for fractures or osteoporosis.  Skin self-exam. / Monthly.  Influenza immunization.** / Every year.  Pneumococcal polysaccharide immunization.** / 1 dose at age 19 (or older) if you have never been vaccinated.  Tetanus, diphtheria, pertussis (Tdap, Td) immunization. / A one-time dose of Tdap vaccine if you are over 65 and have contact with an infant, are a Dietitian, or simply want to be protected from whooping cough. After that, you need a Td booster dose every 10 years.  Varicella immunization.** / Consult your caregiver.  Meningococcal immunization.** / Consult your caregiver.  Hepatitis A immunization.** / Consult your caregiver. 2 doses, 6 to 18 months apart.  Hepatitis B immunization.** / Check with your caregiver. 3 doses, usually over 6 months. ** Family history and personal history of risk and conditions may change your caregiver's  recommendations. Document Released: 11/28/2001 Document Revised: 12/25/2011 Document Reviewed: 02/27/2011 [facility] Patient Information 2014 [place], [place].

## 2013-03-19 NOTE — Progress Notes (Signed)
Patient ID: Demetrios Loll, female   DOB: 12-05-1955, 57 y.o.   MRN: [id number] [facility]OOKS STOTZ [id number] 28-Oct-1955 03/19/2013      Progress Note-Follow Up  Subjective  Chief Complaint  Chief Complaint  Patient presents with  . Annual Exam    physical    HPI  Patient is a 57 year old Caucasian female who is in today for annual exam. Generally in good health. No recent illness. He is struggling with some hot flashes intermittently) 90 they are tolerable. She had been using Boniva but has chosen to stop was on it for 5 years. Last colonoscopy was in 2012 lungs clear did have a colonoscopy in 2007 with a polyp. No changes in bowels. No abdominal pain, back pain, nausea, vomiting, diarrhea, constipation. She does have a history of some benign breast cyst that has progressed complaints today. No chest pain no palpitations, shortness of breath   Past Medical History  Diagnosis Date  . Skin cancer   . Hypertension   . Uterine fibroid   . Osteopenia   . Cerumen impaction     chronic    Past Surgical History  Procedure Laterality Date  . Breast surgery      biopsy left  . Myringoplasty  1972  . Tonsillectomy      Family History  Problem Relation Age of Onset  . Osteoporosis Mother   . Hyperlipidemia Mother   . Hypertension Mother   . Heart disease Father   . Diabetes Father   . Hyperlipidemia Father   . Hypertension Father   . Diabetes Sister   . Kidney disease Other   . Alcohol abuse Other     History   Social History  . Marital Status: Married    Spouse Name: N/A    Number of Children: N/A  . Years of Education: N/A   Occupational History  . Not on file.   Social History Main Topics  . Smoking status: Never Smoker   . Smokeless tobacco: Never Used  . Alcohol Use: Yes  . Drug Use: No  . Sexually Active: Not on file   Other Topics Concern  . Not on file   Social History Narrative  . No narrative on file    No current outpatient prescriptions on file  prior to visit.   No current facility-administered medications on file prior to visit.    No Known Allergies  Review of Systems  Review of Systems  Constitutional: Negative for fever and malaise/fatigue.  HENT: Negative for congestion.   Eyes: Negative for discharge.  Respiratory: Negative for shortness of breath.   Cardiovascular: Negative for chest pain, palpitations and leg swelling.  Gastrointestinal: Negative for nausea, abdominal pain and diarrhea.  Genitourinary: Negative for dysuria.  Musculoskeletal: Negative for falls.  Skin: Negative for rash.  Neurological: Negative for loss of consciousness and headaches.  Endo/Heme/Allergies: Negative for polydipsia.  Psychiatric/Behavioral: Negative for depression and suicidal ideas. The patient is not nervous/anxious and does not have insomnia.     Objective  BP 120/78  Pulse 86  Temp(Src) 98.2 F (36.8 C) (Oral)  Ht 5\' 3"  (1.6 m)  Wt 133 lb 0.6 oz (60.347 kg)  BMI 23.57 kg/m2  SpO2 97%  Physical Exam  Physical Exam  Constitutional: She is oriented to person, place, and time and well-developed, well-nourished, and in no distress. No distress.  HENT:  Head: Normocephalic and atraumatic.  Right Ear: External ear normal.  Left Ear: External ear normal.  Nose: Nose normal.  Mouth/Throat: Oropharynx is clear and moist. No oropharyngeal exudate.  Eyes: Conjunctivae and EOM are normal. Right eye exhibits discharge.  Neck: Neck supple. No thyromegaly present.  Cardiovascular: Normal rate, regular rhythm and normal heart sounds.   No murmur heard. Pulmonary/Chest: Effort normal and breath sounds normal. She has no wheezes. She has no rales. She exhibits no tenderness.  Abdominal: Soft. Bowel sounds are normal. She exhibits no distension and no mass. There is no tenderness. There is no rebound and no guarding.  Musculoskeletal: She exhibits no edema.  Lymphadenopathy:    She has no cervical adenopathy.  Neurological: She is  alert and oriented to person, place, and time. She has normal reflexes. No cranial nerve deficit. Gait normal.  Skin: Skin is warm and dry. No rash noted. She is not diaphoretic.  Psychiatric: Memory, affect and judgment normal.    Lab Results  Component Value Date   TSH 3.189 08/24/2011   Lab Results  Component Value Date   WBC 4.8 06/06/2011   HGB 15.1* 06/06/2011   HCT 45.7 06/06/2011   MCV 94.2 06/06/2011   PLT 328 06/06/2011   Lab Results  Component Value Date   CREATININE 0.68 06/06/2011   BUN 15 06/06/2011   NA 142 06/06/2011   K 4.0 06/06/2011   CL 100 06/06/2011   CO2 30 06/06/2011   Lab Results  Component Value Date   ALT 21 06/06/2011   AST 22 06/06/2011   ALKPHOS 56 06/06/2011   BILITOT 0.6 06/06/2011   Lab Results  Component Value Date   CHOL 208* 08/24/2011   Lab Results  Component Value Date   HDL 48 08/24/2011   Lab Results  Component Value Date   LDLCALC 137* 08/24/2011   Lab Results  Component Value Date   TRIG 117 08/24/2011   Lab Results  Component Value Date   CHOLHDL 4.3 08/24/2011     Assessment & Plan  Annual physical exam Check fasting labs today. Encouraged regular exercise, good sleep, avoid simple carbs.  OSTEOPENIA Stopped Boniva after 5 years of use, encouraged 3 servings of calcium and vitamin d daily. Recheck Bone density usually done through her GYN  ELEVATED BLOOD PRESSURE WITHOUT DIAGNOSIS OF HYPERTENSION Well controlled, no changes, consider DASH diet  Other and unspecified hyperlipidemia Avoid trans fats, check lipid  Hyperglycemia Minimize simple carbs, increase exercise

## 2013-03-19 NOTE — Assessment & Plan Note (Signed)
Minimize simple carbs, increase exercise 

## 2013-03-19 NOTE — Assessment & Plan Note (Signed)
Well controlled, no changes, consider DASH diet 

## 2013-03-19 NOTE — Assessment & Plan Note (Signed)
Stopped Boniva after 5 years of use, encouraged 3 servings of calcium and vitamin d daily. Recheck Bone density usually done through her GYN

## 2013-03-19 NOTE — Assessment & Plan Note (Signed)
Avoid trans fats, check lipid

## 2013-03-19 NOTE — Assessment & Plan Note (Signed)
Check fasting labs today. Encouraged regular exercise, good sleep, avoid simple carbs.

## 2013-03-20 ENCOUNTER — Encounter: Payer: Self-pay | Admitting: Family Medicine

## 2013-03-27 ENCOUNTER — Encounter: Payer: Self-pay | Admitting: Family Medicine

## 2013-07-07 ENCOUNTER — Ambulatory Visit (INDEPENDENT_AMBULATORY_CARE_PROVIDER_SITE_OTHER): Payer: 59 | Admitting: Physician Assistant

## 2013-07-07 ENCOUNTER — Encounter: Payer: Self-pay | Admitting: Physician Assistant

## 2013-07-07 VITALS — BP 126/84 | HR 83 | Temp 98.5°F | Resp 16 | Ht 63.0 in | Wt 134.5 lb

## 2013-07-07 DIAGNOSIS — L259 Unspecified contact dermatitis, unspecified cause: Secondary | ICD-10-CM | POA: Insufficient documentation

## 2013-07-07 DIAGNOSIS — Z23 Encounter for immunization: Secondary | ICD-10-CM

## 2013-07-07 MED ORDER — CLOBETASOL PROPIONATE 0.05 % EX CREA: TOPICAL_CREAM | Freq: Two times a day (BID) | CUTANEOUS | Status: DC

## 2013-07-07 NOTE — Assessment & Plan Note (Signed)
Rx Temovate topical cream.  Astringent to help dry area.  Avoid excessive rubbing of rash due to risk of spreading.  Ice packs for pruritus.  Benadryl if pruritus becomes severe.

## 2013-07-07 NOTE — Patient Instructions (Signed)
Apply cream as prescribed.  Please clean area and pat dry with separate washcloth from rest of body.  Ice compresses for itch.  Benadryl if itch is intense.  Please call or return to clinic if rash spreads to face or if symptoms are not improving.  Contact Dermatitis Contact dermatitis is a reaction to certain substances that touch the skin. Contact dermatitis can be either irritant contact dermatitis or allergic contact dermatitis. Irritant contact dermatitis does not require previous exposure to the substance for a reaction to occur.Allergic contact dermatitis only occurs if you have been exposed to the substance before. Upon a repeat exposure, your body reacts to the substance.  CAUSES  Many substances can cause contact dermatitis. Irritant dermatitis is most commonly caused by repeated exposure to mildly irritating substances, such as:  Makeup.  Soaps.  Detergents.  Bleaches.  Acids.  Metal salts, such as nickel. Allergic contact dermatitis is most commonly caused by exposure to:  Poisonous plants.  Chemicals (deodorants, shampoos).  Jewelry.  Latex.  Neomycin in triple antibiotic cream.  Preservatives in products, including clothing. SYMPTOMS  The area of skin that is exposed may develop:  Dryness or flaking.  Redness.  Cracks.  Itching.  Pain or a burning sensation.  Blisters. With allergic contact dermatitis, there may also be swelling in areas such as the eyelids, mouth, or genitals.  DIAGNOSIS  Your caregiver can usually tell what the problem is by doing a physical exam. In cases where the cause is uncertain and an allergic contact dermatitis is suspected, a patch skin test may be performed to help determine the cause of your dermatitis. TREATMENT Treatment includes protecting the skin from further contact with the irritating substance by avoiding that substance if possible. Barrier creams, powders, and gloves may be helpful. Your caregiver may also  recommend:  Steroid creams or ointments applied 2 times daily. For best results, soak the rash area in cool water for 20 minutes. Then apply the medicine. Cover the area with a plastic wrap. You can store the steroid cream in the refrigerator for a "chilly" effect on your rash. That may decrease itching. Oral steroid medicines may be needed in more severe cases.  Antibiotics or antibacterial ointments if a skin infection is present.  Antihistamine lotion or an antihistamine taken by mouth to ease itching.  Lubricants to keep moisture in your skin.  Burow's solution to reduce redness and soreness or to dry a weeping rash. Mix one packet or tablet of solution in 2 cups cool water. Dip a clean washcloth in the mixture, wring it out a bit, and put it on the affected area. Leave the cloth in place for 30 minutes. Do this as often as possible throughout the day.  Taking several cornstarch or baking soda baths daily if the area is too large to cover with a washcloth. Harsh chemicals, such as alkalis or acids, can cause skin damage that is like a burn. You should flush your skin for 15 to 20 minutes with cold water after such an exposure. You should also seek immediate medical care after exposure. Bandages (dressings), antibiotics, and pain medicine may be needed for severely irritated skin.  HOME CARE INSTRUCTIONS  Avoid the substance that caused your reaction.  Keep the area of skin that is affected away from hot water, soap, sunlight, chemicals, acidic substances, or anything else that would irritate your skin.  Do not scratch the rash. Scratching may cause the rash to become infected.  You may take cool  baths to help stop the itching.  Only take over-the-counter or prescription medicines as directed by your caregiver.  See your caregiver for follow-up care as directed to make sure your skin is healing properly. SEEK MEDICAL CARE IF:   Your condition is not better after 3 days of  treatment.  You seem to be getting worse.  You see signs of infection such as swelling, tenderness, redness, soreness, or warmth in the affected area.  You have any problems related to your medicines. Document Released: 09/29/2000 Document Revised: 12/25/2011 Document Reviewed: 03/07/2011 [facility] Patient Information 2014 [place], [place].

## 2013-07-07 NOTE — Progress Notes (Signed)
Patient ID: Yolanda Hernandez, female   DOB: 02-21-1956, 57 y.o.   MRN: [id number]  Patient presents to clinic today c/o pruritic rash of right forearm x 3 days.  Patient denies being outside or having known exposure to rhus species of plant.  Denies change in hygiene product or cleaning supplies.  Rash has grown in size and patient noticed a similar area of rash on left hip since this morning.  Denies rash elsewhere.  Denies history of significant allergies.  Has tried to keep area clean but rash has persisted.   Past Medical History  Diagnosis Date  . Skin cancer   . Hypertension   . Uterine fibroid   . Osteopenia   . Cerumen impaction     chronic  . Other and unspecified hyperlipidemia 03/19/2013  . Hyperglycemia 03/19/2013    No current outpatient prescriptions on file prior to visit.   No current facility-administered medications on file prior to visit.    No Known Allergies  Family History  Problem Relation Age of Onset  . Osteoporosis Mother   . Hyperlipidemia Mother   . Hypertension Mother   . Heart disease Father   . Diabetes Father   . Hyperlipidemia Father   . Hypertension Father   . Diabetes Sister   . Heart disease Sister     s/p cardiac stent  . Hyperlipidemia Sister   . Hypertension Sister   . Kidney disease Other   . Alcohol abuse Other     History   Social History  . Marital Status: Married    Spouse Name: N/A    Number of Children: N/A  . Years of Education: N/A   Social History Main Topics  . Smoking status: Never Smoker   . Smokeless tobacco: Never Used  . Alcohol Use: Yes  . Drug Use: No  . Sexual Activity: None   Other Topics Concern  . None   Social History Narrative  . None   ROS See HPI  Filed Vitals:   07/07/13 1548  BP: 126/84  Pulse: 83  Temp: 98.5 F (36.9 C)  Resp: 16   Physical Exam  Vitals reviewed. Constitutional: She is oriented to person, place, and time and well-developed, well-nourished, and in no distress.   HENT:  Head: Normocephalic and atraumatic.  Eyes: Conjunctivae are normal.  Neck: Neck supple.  Cardiovascular: Normal rate, regular rhythm and normal heart sounds.   Lymphadenopathy:    She has no cervical adenopathy.  Neurological: She is alert and oriented to person, place, and time.  Skin: Skin is warm and dry.  Presence of a linear maculopapular rash on right forearm about 6 cm in length.  Similar rash on R hip.  No rash noted elsewhere.  No evidence of vesicular lesion or secondary infections    Assessment/Plan: Contact dermatitis Rx Temovate topical cream.  Astringent to help dry area.  Avoid excessive rubbing of rash due to risk of spreading.  Ice packs for pruritus.  Benadryl if pruritus becomes severe.

## 2013-07-17 ENCOUNTER — Telehealth: Payer: Self-pay

## 2013-07-17 NOTE — Telephone Encounter (Signed)
Spoke with patient, will document & leave documentation at front desk for pt to pick-up/SLS

## 2013-07-17 NOTE — Addendum Note (Signed)
Addended by: Regis Bill on: 07/17/2013 04:06 PM   Modules accepted: Orders

## 2013-07-17 NOTE — Telephone Encounter (Signed)
Patient left a vm stating that she saw Selena Batten on Sept 22nd and was given a flu vaccination? Pt states she needs this documentation or she has to wear a mask until the middle of March for work. Pt states she looked on her paperwork from [facility] and it doesn't say anything about receiving a vaccination?  Please advise?

## 2014-03-20 LAB — HM MAMMOGRAPHY: HM MAMMO: NEGATIVE

## 2014-03-23 ENCOUNTER — Encounter: Payer: Self-pay | Admitting: Family Medicine

## 2014-10-23 ENCOUNTER — Ambulatory Visit (INDEPENDENT_AMBULATORY_CARE_PROVIDER_SITE_OTHER): Payer: 59

## 2014-10-23 DIAGNOSIS — Z23 Encounter for immunization: Secondary | ICD-10-CM

## 2014-10-23 NOTE — Progress Notes (Signed)
Pre visit review using our clinic review tool, if applicable. No additional management support is needed unless otherwise documented below in the visit note. 

## 2014-10-23 NOTE — Progress Notes (Signed)
Pt tolerated injection well.  No signs of a reaction upon leaving the clinic.   

## 2015-01-25 ENCOUNTER — Ambulatory Visit (INDEPENDENT_AMBULATORY_CARE_PROVIDER_SITE_OTHER): Payer: 59 | Admitting: Family

## 2015-01-25 ENCOUNTER — Ambulatory Visit (HOSPITAL_BASED_OUTPATIENT_CLINIC_OR_DEPARTMENT_OTHER)
Admission: RE | Admit: 2015-01-25 | Discharge: 2015-01-25 | Disposition: A | Discharge status: Home / Self Care | Payer: 59 | Source: Ambulatory Visit | Attending: Family | Admitting: Family

## 2015-01-25 ENCOUNTER — Encounter: Payer: Self-pay | Admitting: Family

## 2015-01-25 VITALS — BP 120/68 | HR 73 | Temp 98.1°F | Resp 18 | Ht 63.0 in | Wt 136.4 lb

## 2015-01-25 DIAGNOSIS — R0781 Pleurodynia: Secondary | ICD-10-CM | POA: Diagnosis not present

## 2015-01-25 DIAGNOSIS — J984 Other disorders of lung: Secondary | ICD-10-CM | POA: Diagnosis not present

## 2015-01-25 DIAGNOSIS — M8580 Other specified disorders of bone density and structure, unspecified site: Secondary | ICD-10-CM | POA: Insufficient documentation

## 2015-01-25 MED ORDER — TRAMADOL HCL 50 MG PO TABS
50.0000 mg | ORAL_TABLET | Freq: Three times a day (TID) | ORAL | Status: DC | PRN
Start: 2015-01-25 — End: 2015-03-12

## 2015-01-25 NOTE — Assessment & Plan Note (Addendum)
New -obtain rib detail to rule out fracture. Short course of prn tramadol provide.

## 2015-01-25 NOTE — Progress Notes (Signed)
   Subjective:    Patient ID: Yolanda Hernandez, female    DOB: 07/11/1956, 59 y.o.   MRN: [id number]  HPI   CC: Right rib pain, x 1 week.  Hx of osteopenia.  Pain is constant.  5/10. Worse with movement No improvement with tylenol.      Review of Systems    see HPI  Past Medical History  Diagnosis Date  . Skin cancer   . Hypertension   . Uterine fibroid   . Osteopenia   . Cerumen impaction     chronic  . Other and unspecified hyperlipidemia 03/19/2013  . Hyperglycemia 03/19/2013    History   Social History  . Marital Status: Married    Spouse Name: N/A  . Number of Children: N/A  . Years of Education: N/A   Occupational History  . Not on file.   Social History Main Topics  . Smoking status: Never Smoker   . Smokeless tobacco: Never Used  . Alcohol Use: Yes  . Drug Use: No  . Sexual Activity: Not on file   Other Topics Concern  . Not on file   Social History Narrative    Past Surgical History  Procedure Laterality Date  . Breast surgery      biopsy left  . Myringoplasty  1972  . Tonsillectomy      Family History  Problem Relation Age of Onset  . Osteoporosis Mother   . Hyperlipidemia Mother   . Hypertension Mother   . Heart disease Father   . Diabetes Father   . Hyperlipidemia Father   . Hypertension Father   . Diabetes Sister   . Heart disease Sister     s/p cardiac stent  . Hyperlipidemia Sister   . Hypertension Sister   . Kidney disease Other   . Alcohol abuse Other     No Known Allergies  Current Outpatient Prescriptions on File Prior to Visit  Medication Sig Dispense Refill  . calcium citrate-vitamin D (CITRACAL+D) 315-200 MG-UNIT per tablet Take 1 tablet by mouth daily.    Javier Docker Oil CAPS Take by mouth daily.     No current facility-administered medications on file prior to visit.    BP 120/68 mmHg  Pulse 73  Temp(Src) 98.1 F (36.7 C) (Oral)  Resp 18  Ht 5\' 3"  (1.6 m)  Wt 136 lb 6.4 oz (61.871 kg)  BMI 24.17 kg/m2  SpO2  99%    Objective:   Physical Exam  Constitutional: She is oriented to person, place, and time. She appears well-developed and well-nourished. No distress.  HENT:  Head: Normocephalic and atraumatic.  Cardiovascular: Normal rate and regular rhythm.   No murmur heard. Pulmonary/Chest: Effort normal and breath sounds normal. No respiratory distress. She has no wheezes. She has no rales. She exhibits no tenderness.  Musculoskeletal:  + tenderness to palpation of rib beneath right breast  Neurological: She is alert and oriented to person, place, and time.  Skin: Skin is warm and dry.          Assessment & Plan:

## 2015-01-25 NOTE — Progress Notes (Signed)
Pre visit review using our clinic review tool, if applicable. No additional management support is needed unless otherwise documented below in the visit note. 

## 2015-01-25 NOTE — Patient Instructions (Signed)
Please complete rib x ray on the first floor. You may use tramadol as needed for pain. Call if symptoms worsen or if not improved in 1 week. We will contact you with your x ray results.

## 2015-01-26 ENCOUNTER — Telehealth: Payer: Self-pay | Admitting: Family

## 2015-01-26 NOTE — Telephone Encounter (Signed)
X ray is negative for fracture. Continue prn tramadol, call if symptoms worsen, sob develops, if rash develops or if pain not improved in 1 week.

## 2015-01-26 NOTE — Telephone Encounter (Signed)
Left message on home # to check "mychart" message and let us know if she has any questions.

## 2015-02-09 ENCOUNTER — Encounter: Payer: Self-pay | Admitting: *Deleted

## 2015-02-19 ENCOUNTER — Telehealth: Payer: Self-pay | Admitting: Family Medicine

## 2015-02-19 NOTE — Telephone Encounter (Signed)
Pre Visit letter sent  °

## 2015-03-12 ENCOUNTER — Ambulatory Visit (INDEPENDENT_AMBULATORY_CARE_PROVIDER_SITE_OTHER): Payer: 59 | Admitting: Family Medicine

## 2015-03-12 ENCOUNTER — Other Ambulatory Visit (HOSPITAL_COMMUNITY)
Admission: RE | Admit: 2015-03-12 | Discharge: 2015-03-12 | Disposition: A | Discharge status: Home / Self Care | Payer: 59 | Source: Ambulatory Visit | Attending: Family Medicine | Admitting: Family Medicine

## 2015-03-12 ENCOUNTER — Encounter: Payer: Self-pay | Admitting: Family Medicine

## 2015-03-12 VITALS — BP 137/70 | HR 77 | Temp 98.2°F | Ht 63.0 in | Wt 134.2 lb

## 2015-03-12 DIAGNOSIS — E7800 Pure hypercholesterolemia, unspecified: Secondary | ICD-10-CM

## 2015-03-12 DIAGNOSIS — Z01419 Encounter for gynecological examination (general) (routine) without abnormal findings: Secondary | ICD-10-CM | POA: Insufficient documentation

## 2015-03-12 DIAGNOSIS — Z23 Encounter for immunization: Secondary | ICD-10-CM | POA: Diagnosis not present

## 2015-03-12 DIAGNOSIS — M8580 Other specified disorders of bone density and structure, unspecified site: Secondary | ICD-10-CM

## 2015-03-12 DIAGNOSIS — R739 Hyperglycemia, unspecified: Secondary | ICD-10-CM

## 2015-03-12 DIAGNOSIS — R030 Elevated blood-pressure reading, without diagnosis of hypertension: Secondary | ICD-10-CM | POA: Diagnosis not present

## 2015-03-12 DIAGNOSIS — Z0000 Encounter for general adult medical examination without abnormal findings: Secondary | ICD-10-CM | POA: Diagnosis not present

## 2015-03-12 DIAGNOSIS — Z87448 Personal history of other diseases of urinary system: Secondary | ICD-10-CM

## 2015-03-12 DIAGNOSIS — IMO0001 Reserved for inherently not codable concepts without codable children: Secondary | ICD-10-CM

## 2015-03-12 DIAGNOSIS — Z124 Encounter for screening for malignant neoplasm of cervix: Secondary | ICD-10-CM | POA: Diagnosis not present

## 2015-03-12 HISTORY — DX: Encounter for screening for malignant neoplasm of cervix: Z12.4

## 2015-03-12 LAB — COMPREHENSIVE METABOLIC PANEL
ALT: 14 U/L (ref 0–35)
AST: 17 U/L (ref 0–37)
Albumin: 4.5 g/dL (ref 3.5–5.2)
Alkaline Phosphatase: 53 U/L (ref 39–117)
BILIRUBIN TOTAL: 0.5 mg/dL (ref 0.2–1.2)
BUN: 18 mg/dL (ref 6–23)
CHLORIDE: 103 meq/L (ref 96–112)
CO2: 30 meq/L (ref 19–32)
CREATININE: 0.67 mg/dL (ref 0.40–1.20)
Calcium: 9.9 mg/dL (ref 8.4–10.5)
GFR: 95.3 mL/min (ref >60.00)
Glucose, Bld: 105 mg/dL — ABNORMAL HIGH (ref 70–99)
POTASSIUM: 3.7 meq/L (ref 3.5–5.1)
SODIUM: 138 meq/L (ref 135–145)
TOTAL PROTEIN: 6.9 g/dL (ref 6.0–8.3)

## 2015-03-12 LAB — HEMOGLOBIN A1C: HEMOGLOBIN A1C: 5.4 % (ref 4.6–6.5)

## 2015-03-12 LAB — LIPID PANEL
Cholesterol: 223 mg/dL — ABNORMAL HIGH (ref 0–200)
HDL: 53.8 mg/dL (ref >39.00)
LDL CALC: 139 mg/dL — AB (ref 0–99)
NONHDL: 169.2
TRIGLYCERIDES: 151 mg/dL — AB (ref 0.0–149.0)
Total CHOL/HDL Ratio: 4
VLDL: 30.2 mg/dL (ref 0.0–40.0)

## 2015-03-12 LAB — CBC
HEMATOCRIT: 44.4 % (ref 36.0–46.0)
HEMOGLOBIN: 15.1 g/dL — AB (ref 12.0–15.0)
MCHC: 34.2 g/dL (ref 30.0–36.0)
MCV: 91.6 fl (ref 78.0–100.0)
Platelets: 370 10*3/uL (ref 150.0–400.0)
RBC: 4.84 Mil/uL (ref 3.87–5.11)
RDW: 12.3 % (ref 11.5–15.5)
WBC: 5.9 10*3/uL (ref 4.0–10.5)

## 2015-03-12 LAB — TSH: TSH: 2.43 u[IU]/mL (ref 0.35–4.50)

## 2015-03-12 NOTE — Assessment & Plan Note (Signed)
Well controlled, no changes to meds. Encouraged heart healthy diet such as the DASH diet and exercise as tolerated.  °

## 2015-03-12 NOTE — Progress Notes (Signed)
Pre visit review using our clinic review tool, if applicable. No additional management support is needed unless otherwise documented below in the visit note. 

## 2015-03-12 NOTE — Patient Instructions (Addendum)
Citracal daily, two servings of calcium in diet daily Vitamin D 2000 IU daily  Preventive Care for Adults A healthy lifestyle and preventive care can promote health and wellness. Preventive health guidelines for women include the following key practices.  A routine yearly physical is a good way to check with your health care provider about your health and preventive screening. It is a chance to share any concerns and updates on your health and to receive a thorough exam.  Visit your dentist for a routine exam and preventive care every 6 months. Brush your teeth twice a day and floss once a day. Good oral hygiene prevents tooth decay and gum disease.  The frequency of eye exams is based on your age, health, family medical history, use of contact lenses, and other factors. Follow your health care provider's recommendations for frequency of eye exams.  Eat a healthy diet. Foods like vegetables, fruits, whole grains, low-fat dairy products, and lean protein foods contain the nutrients you need without too many calories. Decrease your intake of foods high in solid fats, added sugars, and salt. Eat the right amount of calories for you.Get information about a proper diet from your health care provider, if necessary.  Regular physical exercise is one of the most important things you can do for your health. Most adults should get at least 150 minutes of moderate-intensity exercise (any activity that increases your heart rate and causes you to sweat) each week. In addition, most adults need muscle-strengthening exercises on 2 or more days a week.  Maintain a healthy weight. The body mass index (BMI) is a screening tool to identify possible weight problems. It provides an estimate of body fat based on height and weight. Your health care provider can find your BMI and can help you achieve or maintain a healthy weight.For adults 20 years and older:  A BMI below 18.5 is considered underweight.  A BMI of 18.5  to 24.9 is normal.  A BMI of 25 to 29.9 is considered overweight.  A BMI of 30 and above is considered obese.  Maintain normal blood lipids and cholesterol levels by exercising and minimizing your intake of saturated fat. Eat a balanced diet with plenty of fruit and vegetables. Blood tests for lipids and cholesterol should begin at age 31 and be repeated every 5 years. If your lipid or cholesterol levels are high, you are over 50, or you are at high risk for heart disease, you may need your cholesterol levels checked more frequently.Ongoing high lipid and cholesterol levels should be treated with medicines if diet and exercise are not working.  If you smoke, find out from your health care provider how to quit. If you do not use tobacco, do not start.  Lung cancer screening is recommended for adults aged 80-80 years who are at high risk for developing lung cancer because of a history of smoking. A yearly low-dose CT scan of the lungs is recommended for people who have at least a 30-pack-year history of smoking and are a current smoker or have quit within the past 15 years. A pack year of smoking is smoking an average of 1 pack of cigarettes a day for 1 year (for example: 1 pack a day for 30 years or 2 packs a day for 15 years). Yearly screening should continue until the smoker has stopped smoking for at least 15 years. Yearly screening should be stopped for people who develop a health problem that would prevent them from having lung  cancer treatment.  If you are pregnant, do not drink alcohol. If you are breastfeeding, be very cautious about drinking alcohol. If you are not pregnant and choose to drink alcohol, do not have more than 1 drink per day. One drink is considered to be 12 ounces (355 mL) of beer, 5 ounces (148 mL) of wine, or 1.5 ounces (44 mL) of liquor.  Avoid use of street drugs. Do not share needles with anyone. Ask for help if you need support or instructions about stopping the use of  drugs.  High blood pressure causes heart disease and increases the risk of stroke. Your blood pressure should be checked at least every 1 to 2 years. Ongoing high blood pressure should be treated with medicines if weight loss and exercise do not work.  If you are 55-79 years old, ask your health care provider if you should take aspirin to prevent strokes.  Diabetes screening involves taking a blood sample to check your fasting blood sugar level. This should be done once every 3 years, after age 45, if you are within normal weight and without risk factors for diabetes. Testing should be considered at a younger age or be carried out more frequently if you are overweight and have at least 1 risk factor for diabetes.  Breast cancer screening is essential preventive care for women. You should practice "breast self-awareness." This means understanding the normal appearance and feel of your breasts and may include breast self-examination. Any changes detected, no matter how small, should be reported to a health care provider. Women in their 20s and 30s should have a clinical breast exam (CBE) by a health care provider as part of a regular health exam every 1 to 3 years. After age 40, women should have a CBE every year. Starting at age 40, women should consider having a mammogram (breast X-ray test) every year. Women who have a family history of breast cancer should talk to their health care provider about genetic screening. Women at a high risk of breast cancer should talk to their health care providers about having an MRI and a mammogram every year.  Breast cancer gene (BRCA)-related cancer risk assessment is recommended for women who have family members with BRCA-related cancers. BRCA-related cancers include breast, ovarian, tubal, and peritoneal cancers. Having family members with these cancers may be associated with an increased risk for harmful changes (mutations) in the breast cancer genes BRCA1 and BRCA2.  Results of the assessment will determine the need for genetic counseling and BRCA1 and BRCA2 testing.  Routine pelvic exams to screen for cancer are no longer recommended for nonpregnant women who are considered low risk for cancer of the pelvic organs (ovaries, uterus, and vagina) and who do not have symptoms. Ask your health care provider if a screening pelvic exam is right for you.  If you have had past treatment for cervical cancer or a condition that could lead to cancer, you need Pap tests and screening for cancer for at least 20 years after your treatment. If Pap tests have been discontinued, your risk factors (such as having a new sexual partner) need to be reassessed to determine if screening should be resumed. Some women have medical problems that increase the chance of getting cervical cancer. In these cases, your health care provider may recommend more frequent screening and Pap tests.  The HPV test is an additional test that may be used for cervical cancer screening. The HPV test looks for the virus that can cause   the cell changes on the cervix. The cells collected during the Pap test can be tested for HPV. The HPV test could be used to screen women aged 59 years and older, and should be used in women of any age who have unclear Pap test results. After the age of 83, women should have HPV testing at the same frequency as a Pap test.  Colorectal cancer can be detected and often prevented. Most routine colorectal cancer screening begins at the age of 72 years and continues through age 19 years. However, your health care provider may recommend screening at an earlier age if you have risk factors for colon cancer. On a yearly basis, your health care provider may provide home test kits to check for hidden blood in the stool. Use of a small camera at the end of a tube, to directly examine the colon (sigmoidoscopy or colonoscopy), can detect the earliest forms of colorectal cancer. Talk to your health  care provider about this at age 50, when routine screening begins. Direct exam of the colon should be repeated every 5-10 years through age 19 years, unless early forms of pre-cancerous polyps or small growths are found.  People who are at an increased risk for hepatitis B should be screened for this virus. You are considered at high risk for hepatitis B if:  You were born in a country where hepatitis B occurs often. Talk with your health care provider about which countries are considered high risk.  Your parents were born in a high-risk country and you have not received a shot to protect against hepatitis B (hepatitis B vaccine).  You have HIV or AIDS.  You use needles to inject street drugs.  You live with, or have sex with, someone who has hepatitis B.  You get hemodialysis treatment.  You take certain medicines for conditions like cancer, organ transplantation, and autoimmune conditions.  Hepatitis C blood testing is recommended for all people born from [birth year] through 1965 and any individual with known risks for hepatitis C.  Practice safe sex. Use condoms and avoid high-risk sexual practices to reduce the spread of sexually transmitted infections (STIs). STIs include gonorrhea, chlamydia, syphilis, trichomonas, herpes, HPV, and human immunodeficiency virus (HIV). Herpes, HIV, and HPV are viral illnesses that have no cure. They can result in disability, cancer, and death.  You should be screened for sexually transmitted illnesses (STIs) including gonorrhea and chlamydia if:  You are sexually active and are younger than 24 years.  You are older than 24 years and your health care provider tells you that you are at risk for this type of infection.  Your sexual activity has changed since you were last screened and you are at an increased risk for chlamydia or gonorrhea. Ask your health care provider if you are at risk.  If you are at risk of being infected with HIV, it is recommended  that you take a prescription medicine daily to prevent HIV infection. This is called preexposure prophylaxis (PrEP). You are considered at risk if:  You are a heterosexual woman, are sexually active, and are at increased risk for HIV infection.  You take drugs by injection.  You are sexually active with a partner who has HIV.  Talk with your health care provider about whether you are at high risk of being infected with HIV. If you choose to begin PrEP, you should first be tested for HIV. You should then be tested every 3 months for as long as you are  taking PrEP.  Osteoporosis is a disease in which the bones lose minerals and strength with aging. This can result in serious bone fractures or breaks. The risk of osteoporosis can be identified using a bone density scan. Women ages 65 years and over and women at risk for fractures or osteoporosis should discuss screening with their health care providers. Ask your health care provider whether you should take a calcium supplement or vitamin D to reduce the rate of osteoporosis.  Menopause can be associated with physical symptoms and risks. Hormone replacement therapy is available to decrease symptoms and risks. You should talk to your health care provider about whether hormone replacement therapy is right for you.  Use sunscreen. Apply sunscreen liberally and repeatedly throughout the day. You should seek shade when your shadow is shorter than you. Protect yourself by wearing long sleeves, pants, a wide-brimmed hat, and sunglasses year round, whenever you are outdoors.  Once a month, do a whole body skin exam, using a mirror to look at the skin on your back. Tell your health care provider of new moles, moles that have irregular borders, moles that are larger than a pencil eraser, or moles that have changed in shape or color.  Stay current with required vaccines (immunizations).  Influenza vaccine. All adults should be immunized every year.  Tetanus,  diphtheria, and acellular pertussis (Td, Tdap) vaccine. Pregnant women should receive 1 dose of Tdap vaccine during each pregnancy. The dose should be obtained regardless of the length of time since the last dose. Immunization is preferred during the 27th-36th week of gestation. An adult who has not previously received Tdap or who does not know her vaccine status should receive 1 dose of Tdap. This initial dose should be followed by tetanus and diphtheria toxoids (Td) booster doses every 10 years. Adults with an unknown or incomplete history of completing a 3-dose immunization series with Td-containing vaccines should begin or complete a primary immunization series including a Tdap dose. Adults should receive a Td booster every 10 years.  Varicella vaccine. An adult without evidence of immunity to varicella should receive 2 doses or a second dose if she has previously received 1 dose. Pregnant females who do not have evidence of immunity should receive the first dose after pregnancy. This first dose should be obtained before leaving the health care facility. The second dose should be obtained 4-8 weeks after the first dose.  Human papillomavirus (HPV) vaccine. Females aged 13-26 years who have not received the vaccine previously should obtain the 3-dose series. The vaccine is not recommended for use in pregnant females. However, pregnancy testing is not needed before receiving a dose. If a female is found to be pregnant after receiving a dose, no treatment is needed. In that case, the remaining doses should be delayed until after the pregnancy. Immunization is recommended for any person with an immunocompromised condition through the age of 26 years if she did not get any or all doses earlier. During the 3-dose series, the second dose should be obtained 4-8 weeks after the first dose. The third dose should be obtained 24 weeks after the first dose and 16 weeks after the second dose.  Zoster vaccine. One dose  is recommended for adults aged 60 years or older unless certain conditions are present.  Measles, mumps, and rubella (MMR) vaccine. Adults born before 1957 generally are considered immune to measles and mumps. Adults born in 1957 or later should have 1 or more doses of MMR vaccine unless   there is a contraindication to the vaccine or there is laboratory evidence of immunity to each of the three diseases. A routine second dose of MMR vaccine should be obtained at least 28 days after the first dose for students attending postsecondary schools, health care workers, or international travelers. People who received inactivated measles vaccine or an unknown type of measles vaccine during 1963-1967 should receive 2 doses of MMR vaccine. People who received inactivated mumps vaccine or an unknown type of mumps vaccine before 1979 and are at high risk for mumps infection should consider immunization with 2 doses of MMR vaccine. For females of childbearing age, rubella immunity should be determined. If there is no evidence of immunity, females who are not pregnant should be vaccinated. If there is no evidence of immunity, females who are pregnant should delay immunization until after pregnancy. Unvaccinated health care workers born before 1957 who lack laboratory evidence of measles, mumps, or rubella immunity or laboratory confirmation of disease should consider measles and mumps immunization with 2 doses of MMR vaccine or rubella immunization with 1 dose of MMR vaccine.  Pneumococcal 13-valent conjugate (PCV13) vaccine. When indicated, a person who is uncertain of her immunization history and has no record of immunization should receive the PCV13 vaccine. An adult aged 19 years or older who has certain medical conditions and has not been previously immunized should receive 1 dose of PCV13 vaccine. This PCV13 should be followed with a dose of pneumococcal polysaccharide (PPSV23) vaccine. The PPSV23 vaccine dose should be  obtained at least 8 weeks after the dose of PCV13 vaccine. An adult aged 19 years or older who has certain medical conditions and previously received 1 or more doses of PPSV23 vaccine should receive 1 dose of PCV13. The PCV13 vaccine dose should be obtained 1 or more years after the last PPSV23 vaccine dose.  Pneumococcal polysaccharide (PPSV23) vaccine. When PCV13 is also indicated, PCV13 should be obtained first. All adults aged 65 years and older should be immunized. An adult younger than age 65 years who has certain medical conditions should be immunized. Any person who resides in a nursing home or long-term care facility should be immunized. An adult smoker should be immunized. People with an immunocompromised condition and certain other conditions should receive both PCV13 and PPSV23 vaccines. People with human immunodeficiency virus (HIV) infection should be immunized as soon as possible after diagnosis. Immunization during chemotherapy or radiation therapy should be avoided. Routine use of PPSV23 vaccine is not recommended for American Indians, [place] Natives, or people younger than 65 years unless there are medical conditions that require PPSV23 vaccine. When indicated, people who have unknown immunization and have no record of immunization should receive PPSV23 vaccine. One-time revaccination 5 years after the first dose of PPSV23 is recommended for people aged 19-64 years who have chronic kidney failure, nephrotic syndrome, asplenia, or immunocompromised conditions. People who received 1-2 doses of PPSV23 before age 65 years should receive another dose of PPSV23 vaccine at age 65 years or later if at least 5 years have passed since the previous dose. Doses of PPSV23 are not needed for people immunized with PPSV23 at or after age 65 years.  Meningococcal vaccine. Adults with asplenia or persistent complement component deficiencies should receive 2 doses of quadrivalent meningococcal conjugate  (MenACWY-D) vaccine. The doses should be obtained at least 2 months apart. Microbiologists working with certain meningococcal bacteria, military recruits, people at risk during an outbreak, and people who travel to or live in countries with   a high rate of meningitis should be immunized. A first-year college student up through age 35 years who is living in a residence hall should receive a dose if she did not receive a dose on or after her 16th birthday. Adults who have certain high-risk conditions should receive one or more doses of vaccine.  Hepatitis A vaccine. Adults who wish to be protected from this disease, have certain high-risk conditions, work with hepatitis A-infected animals, work in hepatitis A research labs, or travel to or work in countries with a high rate of hepatitis A should be immunized. Adults who were previously unvaccinated and who anticipate close contact with an international adoptee during the first 60 days after arrival in the [place] from a country with a high rate of hepatitis A should be immunized.  Hepatitis B vaccine. Adults who wish to be protected from this disease, have certain high-risk conditions, may be exposed to blood or other infectious body fluids, are household contacts or sex partners of hepatitis B positive people, are clients or workers in certain care facilities, or travel to or work in countries with a high rate of hepatitis B should be immunized.  Haemophilus influenzae type b (Hib) vaccine. A previously unvaccinated person with asplenia or sickle cell disease or having a scheduled splenectomy should receive 1 dose of Hib vaccine. Regardless of previous immunization, a recipient of a hematopoietic stem cell transplant should receive a 3-dose series 6-12 months after her successful transplant. Hib vaccine is not recommended for adults with HIV infection. Preventive Services / Frequency Ages 44 to 59 years  Blood pressure check.** / Every 1 to 2  years.  Lipid and cholesterol check.** / Every 5 years beginning at age 28.  Clinical breast exam.** / Every 3 years for women in their 55s and 55s.  BRCA-related cancer risk assessment.** / For women who have family members with a BRCA-related cancer (breast, ovarian, tubal, or peritoneal cancers).  Pap test.** / Every 2 years from ages 4 through 58. Every 3 years starting at age 44 through age 52 or 7 with a history of 3 consecutive normal Pap tests.  HPV screening.** / Every 3 years from ages 73 through ages 61 to 71 with a history of 3 consecutive normal Pap tests.  Hepatitis C blood test.** / For any individual with known risks for hepatitis C.  Skin self-exam. / Monthly.  Influenza vaccine. / Every year.  Tetanus, diphtheria, and acellular pertussis (Tdap, Td) vaccine.** / Consult your health care provider. Pregnant women should receive 1 dose of Tdap vaccine during each pregnancy. 1 dose of Td every 10 years.  Varicella vaccine.** / Consult your health care provider. Pregnant females who do not have evidence of immunity should receive the first dose after pregnancy.  HPV vaccine. / 3 doses over 6 months, if 18 and younger. The vaccine is not recommended for use in pregnant females. However, pregnancy testing is not needed before receiving a dose.  Measles, mumps, rubella (MMR) vaccine.** / You need at least 1 dose of MMR if you were born in 1957 or later. You may also need a 2nd dose. For females of childbearing age, rubella immunity should be determined. If there is no evidence of immunity, females who are not pregnant should be vaccinated. If there is no evidence of immunity, females who are pregnant should delay immunization until after pregnancy.  Pneumococcal 13-valent conjugate (PCV13) vaccine.** / Consult your health care provider.  Pneumococcal polysaccharide (PPSV23) vaccine.** / 1 to  2 doses if you smoke cigarettes or if you have certain conditions.  Meningococcal  vaccine.** / 1 dose if you are age 19 to 21 years and a first-year college student living in a residence hall, or have one of several medical conditions, you need to get vaccinated against meningococcal disease. You may also need additional booster doses.  Hepatitis A vaccine.** / Consult your health care provider.  Hepatitis B vaccine.** / Consult your health care provider.  Haemophilus influenzae type b (Hib) vaccine.** / Consult your health care provider. Ages 40 to 64 years  Blood pressure check.** / Every 1 to 2 years.  Lipid and cholesterol check.** / Every 5 years beginning at age 20 years.  Lung cancer screening. / Every year if you are aged 55-80 years and have a 30-pack-year history of smoking and currently smoke or have quit within the past 15 years. Yearly screening is stopped once you have quit smoking for at least 15 years or develop a health problem that would prevent you from having lung cancer treatment.  Clinical breast exam.** / Every year after age 40 years.  BRCA-related cancer risk assessment.** / For women who have family members with a BRCA-related cancer (breast, ovarian, tubal, or peritoneal cancers).  Mammogram.** / Every year beginning at age 40 years and continuing for as long as you are in good health. Consult with your health care provider.  Pap test.** / Every 3 years starting at age 30 years through age 65 or 70 years with a history of 3 consecutive normal Pap tests.  HPV screening.** / Every 3 years from ages 30 years through ages 65 to 70 years with a history of 3 consecutive normal Pap tests.  Fecal occult blood test (FOBT) of stool. / Every year beginning at age 50 years and continuing until age 75 years. You may not need to do this test if you get a colonoscopy every 10 years.  Flexible sigmoidoscopy or colonoscopy.** / Every 5 years for a flexible sigmoidoscopy or every 10 years for a colonoscopy beginning at age 50 years and continuing until age 75  years.  Hepatitis C blood test.** / For all people born from 1945 through 1965 and any individual with known risks for hepatitis C.  Skin self-exam. / Monthly.  Influenza vaccine. / Every year.  Tetanus, diphtheria, and acellular pertussis (Tdap/Td) vaccine.** / Consult your health care provider. Pregnant women should receive 1 dose of Tdap vaccine during each pregnancy. 1 dose of Td every 10 years.  Varicella vaccine.** / Consult your health care provider. Pregnant females who do not have evidence of immunity should receive the first dose after pregnancy.  Zoster vaccine.** / 1 dose for adults aged 60 years or older.  Measles, mumps, rubella (MMR) vaccine.** / You need at least 1 dose of MMR if you were born in 1957 or later. You may also need a 2nd dose. For females of childbearing age, rubella immunity should be determined. If there is no evidence of immunity, females who are not pregnant should be vaccinated. If there is no evidence of immunity, females who are pregnant should delay immunization until after pregnancy.  Pneumococcal 13-valent conjugate (PCV13) vaccine.** / Consult your health care provider.  Pneumococcal polysaccharide (PPSV23) vaccine.** / 1 to 2 doses if you smoke cigarettes or if you have certain conditions.  Meningococcal vaccine.** / Consult your health care provider.  Hepatitis A vaccine.** / Consult your health care provider.  Hepatitis B vaccine.** / Consult your health care   provider.  Haemophilus influenzae type b (Hib) vaccine.** / Consult your health care provider. Ages 51 years and over  Blood pressure check.** / Every 1 to 2 years.  Lipid and cholesterol check.** / Every 5 years beginning at age 69 years.  Lung cancer screening. / Every year if you are aged 59-80 years and have a 30-pack-year history of smoking and currently smoke or have quit within the past 15 years. Yearly screening is stopped once you have quit smoking for at least 15 years or  develop a health problem that would prevent you from having lung cancer treatment.  Clinical breast exam.** / Every year after age 38 years.  BRCA-related cancer risk assessment.** / For women who have family members with a BRCA-related cancer (breast, ovarian, tubal, or peritoneal cancers).  Mammogram.** / Every year beginning at age 7 years and continuing for as long as you are in good health. Consult with your health care provider.  Pap test.** / Every 3 years starting at age 61 years through age 55 or 66 years with 3 consecutive normal Pap tests. Testing can be stopped between 65 and 70 years with 3 consecutive normal Pap tests and no abnormal Pap or HPV tests in the past 10 years.  HPV screening.** / Every 3 years from ages 27 years through ages 93 or 17 years with a history of 3 consecutive normal Pap tests. Testing can be stopped between 65 and 70 years with 3 consecutive normal Pap tests and no abnormal Pap or HPV tests in the past 10 years.  Fecal occult blood test (FOBT) of stool. / Every year beginning at age 58 years and continuing until age 18 years. You may not need to do this test if you get a colonoscopy every 10 years.  Flexible sigmoidoscopy or colonoscopy.** / Every 5 years for a flexible sigmoidoscopy or every 10 years for a colonoscopy beginning at age 42 years and continuing until age 25 years.  Hepatitis C blood test.** / For all people born from [birth year] through 1966 and any individual with known risks for hepatitis C.  Osteoporosis screening.** / A one-time screening for women ages 71 years and over and women at risk for fractures or osteoporosis.  Skin self-exam. / Monthly.  Influenza vaccine. / Every year.  Tetanus, diphtheria, and acellular pertussis (Tdap/Td) vaccine.** / 1 dose of Td every 10 years.  Varicella vaccine.** / Consult your health care provider.  Zoster vaccine.** / 1 dose for adults aged 29 years or older.  Pneumococcal 13-valent conjugate  (PCV13) vaccine.** / Consult your health care provider.  Pneumococcal polysaccharide (PPSV23) vaccine.** / 1 dose for all adults aged 17 years and older.  Meningococcal vaccine.** / Consult your health care provider.  Hepatitis A vaccine.** / Consult your health care provider.  Hepatitis B vaccine.** / Consult your health care provider.  Haemophilus influenzae type b (Hib) vaccine.** / Consult your health care provider. ** Family history and personal history of risk and conditions may change your health care provider's recommendations. Document Released: 11/28/2001 Document Revised: 02/16/2014 Document Reviewed: 02/27/2011 [facility] Patient Information 2015 [place], [place]. This information is not intended to replace advice given to you by your health care provider. Make sure you discuss any questions you have with your health care provider.

## 2015-03-12 NOTE — Assessment & Plan Note (Signed)
Pap today, no concerns on exam.  

## 2015-03-14 LAB — URINE CULTURE

## 2015-03-16 LAB — CYTOLOGY - PAP

## 2015-03-16 LAB — VITAMIN D 1,25 DIHYDROXY
VITAMIN D3 1, 25 (OH): 76 pg/mL
Vitamin D 1, 25 (OH)2 Total: 76 pg/mL — ABNORMAL HIGH (ref 18–72)

## 2015-03-21 NOTE — Assessment & Plan Note (Signed)
minimize simple carbs. Increase exercise as tolerated.  

## 2015-03-21 NOTE — Assessment & Plan Note (Signed)
Encouraged heart healthy diet, increase exercise, avoid trans fats, consider a krill oil cap daily 

## 2015-03-21 NOTE — Progress Notes (Signed)
Yolanda Hernandez  [id number] July 15, 1956 03/21/2015      Progress Note-Follow Up  Subjective  Chief Complaint  Chief Complaint  Patient presents with  . Annual Exam    HPI  Patient is a 59 y.o. female in today for routine medical care. Patient is in today for annual exam. Generally doing well. Is hoping to have a GYN exam today. No recent illness. No acute concerns. Does note there's been some mild elevated blood pressures recently but no associated headache or other concerns. Denies CP/palp/SOB/HA/congestion/fevers/GI or GU c/o. Taking meds as prescribed  Past Medical History  Diagnosis Date  . Skin cancer   . Hypertension   . Uterine fibroid   . Osteopenia   . Cerumen impaction     chronic  . Other and unspecified hyperlipidemia 03/19/2013  . Hyperglycemia 03/19/2013  . Cervical cancer screening 03/12/2015    Menarche at 10 Regular and moderate flow  history of abnormal pap in past, bx and cryo once many years ago, normal since. Last 2011 and normal G0P0, s/p No history of abnormal MGM Last in 2015, no breast concerns  No concerns today Biopsy, cervical: gyn surgeries Menopause at 57 ish    Past Surgical History  Procedure Laterality Date  . Breast surgery      biopsy left  . Myringoplasty  1972  . Tonsillectomy      Family History  Problem Relation Age of Onset  . Osteoporosis Mother   . Hyperlipidemia Mother   . Hypertension Mother   . Heart disease Father   . Diabetes Father   . Hyperlipidemia Father   . Hypertension Father   . Diabetes Sister   . Heart disease Sister     s/p cardiac stent  . Hyperlipidemia Sister   . Hypertension Sister   . Kidney disease Other   . Alcohol abuse Other     History   Social History  . Marital Status: Married    Spouse Name: N/A  . Number of Children: N/A  . Years of Education: N/A   Occupational History  . Not on file.   Social History Main Topics  . Smoking status: Never Smoker   . Smokeless tobacco: Never Used    . Alcohol Use: Yes  . Drug Use: No  . Sexual Activity: Yes     Comment: lives with husband, works at [employer], no dietary restricitons, exercises with strength training, elliptical etc   Other Topics Concern  . Not on file   Social History Narrative    Current Outpatient Prescriptions on File Prior to Visit  Medication Sig Dispense Refill  . calcium citrate-vitamin D (CITRACAL+D) 315-200 MG-UNIT per tablet Take 1 tablet by mouth daily.    Javier Docker Oil CAPS Take by mouth daily.     No current facility-administered medications on file prior to visit.    No Known Allergies  Review of Systems  Review of Systems  Constitutional: Negative for fever and malaise/fatigue.  HENT: Negative for congestion.   Eyes: Positive for redness. Negative for discharge.  Respiratory: Negative for shortness of breath.   Cardiovascular: Negative for chest pain, palpitations and leg swelling.  Gastrointestinal: Negative for nausea, abdominal pain and diarrhea.  Genitourinary: Negative for dysuria.  Musculoskeletal: Negative for falls.  Skin: Negative for rash.  Neurological: Negative for loss of consciousness and headaches.  Endo/Heme/Allergies: Negative for polydipsia.  Psychiatric/Behavioral: Negative for depression and suicidal ideas. The patient is not nervous/anxious and does not have insomnia.  Objective  BP 137/70 mmHg  Pulse 77  Temp(Src) 98.2 F (36.8 C) (Oral)  Ht 5\' 3"  (1.6 m)  Wt 134 lb 4 oz (60.895 kg)  BMI 23.79 kg/m2  SpO2 99%  Physical Exam  Physical Exam  Constitutional: She is oriented to person, place, and time and well-developed, well-nourished, and in no distress. No distress.  HENT:  Head: Normocephalic and atraumatic.  Right Ear: External ear normal.  Left Ear: External ear normal.  Nose: Nose normal.  Mouth/Throat: Oropharynx is clear and moist. No oropharyngeal exudate.  Eyes: Conjunctivae are normal. Pupils are equal, round, and reactive to  light. Right eye exhibits no discharge. Left eye exhibits no discharge. No scleral icterus.  Neck: Normal range of motion. Neck supple. No thyromegaly present.  Cardiovascular: Normal rate, regular rhythm, normal heart sounds and intact distal pulses.   No murmur heard. Pulmonary/Chest: Effort normal and breath sounds normal. No respiratory distress. She has no wheezes. She has no rales.  Abdominal: Soft. Bowel sounds are normal. She exhibits no distension and no mass. There is no tenderness.  Musculoskeletal: Normal range of motion. She exhibits no edema or tenderness.  Lymphadenopathy:    She has no cervical adenopathy.  Neurological: She is alert and oriented to person, place, and time. She has normal reflexes. No cranial nerve deficit. Coordination normal.  Skin: Skin is warm and dry. No rash noted. She is not diaphoretic.  Psychiatric: Mood, memory and affect normal.    Lab Results  Component Value Date   TSH 2.43 03/12/2015   Lab Results  Component Value Date   WBC 5.9 03/12/2015   HGB 15.1* 03/12/2015   HCT 44.4 03/12/2015   MCV 91.6 03/12/2015   PLT 370.0 03/12/2015   Lab Results  Component Value Date   CREATININE 0.67 03/12/2015   BUN 18 03/12/2015   NA 138 03/12/2015   K 3.7 03/12/2015   CL 103 03/12/2015   CO2 30 03/12/2015   Lab Results  Component Value Date   ALT 14 03/12/2015   AST 17 03/12/2015   ALKPHOS 53 03/12/2015   BILITOT 0.5 03/12/2015   Lab Results  Component Value Date   CHOL 223* 03/12/2015   Lab Results  Component Value Date   HDL 53.80 03/12/2015   Lab Results  Component Value Date   LDLCALC 139* 03/12/2015   Lab Results  Component Value Date   TRIG 151.0* 03/12/2015   Lab Results  Component Value Date   CHOLHDL 4 03/12/2015     Assessment & Plan  Cervical cancer screening Pap today, no concerns on exam.    ELEVATED BLOOD PRESSURE WITHOUT DIAGNOSIS OF HYPERTENSION Well controlled, no changes to meds. Encouraged heart  healthy diet such as the DASH diet and exercise as tolerated.    OSTEOPENIA Dexa scan ordered, continue vitamin d and calcium intake, regular exercise encouraged   Other and unspecified hyperlipidemia Encouraged heart healthy diet, increase exercise, avoid trans fats, consider a krill oil cap daily   Annual physical exam Patient encouraged to maintain heart healthy diet, regular exercise, adequate sleep. Consider daily probiotics. Take medications as prescribed   Hyperglycemia  minimize simple carbs. Increase exercise as tolerated.

## 2015-03-21 NOTE — Assessment & Plan Note (Signed)
Patient encouraged to maintain heart healthy diet, regular exercise, adequate sleep. Consider daily probiotics. Take medications as prescribed 

## 2015-03-21 NOTE — Assessment & Plan Note (Signed)
Dexa scan ordered, continue vitamin d and calcium intake, regular exercise encouraged

## 2015-04-13 ENCOUNTER — Encounter: Payer: Self-pay | Admitting: Family Medicine

## 2015-04-16 ENCOUNTER — Telehealth: Payer: Self-pay | Admitting: Family Medicine

## 2015-04-16 NOTE — Telephone Encounter (Signed)
I have spoken to this patient regarding her results.

## 2015-04-16 NOTE — Telephone Encounter (Signed)
Pt said she just missed a call from our office. She believes it was in regard to a bone density scan. Please f/u as needed with the pt. Phone # [phone].

## 2015-04-16 NOTE — Telephone Encounter (Signed)
Called the patient with her Bone Density results and PCP instructions. Please read email dated 04/13/15 per Raiford Noble PA covered while PCP was out of town.  The patient stated has her osteoporosis worsened.  Also recent labs in May you took her of Vitamin D due to numbers being too high.  The patient is a little confused and would like to know if she should be taking something for osteoporosis?

## 2015-04-17 NOTE — Telephone Encounter (Signed)
I will recheck the vitamin D again in august, confirm how much she is taking now. I had seen her Vitamin D level when I was looking at the bone scan. So will have her stay at same dose of Vitamin D for now til repeat level. The bone density has not been scanned in yet so could not review again. Please get a copy so i can look again but I do not believe it requires further changes.

## 2015-04-17 NOTE — Telephone Encounter (Signed)
See previous note

## 2015-04-20 NOTE — Telephone Encounter (Signed)
Called the patient left message to call back 

## 2015-04-20 NOTE — Telephone Encounter (Signed)
Called the patient to inform per PCP instructions to take Calcium/Magnesium/Zinc, can go on [email or web address] to look for if unable to find in a drug store.  Informed to schedule appt. In August with PCP redo labs/evaluate at that time a long term plan.  The patient agreed to all.

## 2015-05-03 ENCOUNTER — Encounter: Payer: Self-pay | Admitting: Family Medicine

## 2015-05-14 ENCOUNTER — Encounter: Payer: Self-pay | Admitting: Physician Assistant

## 2015-05-14 ENCOUNTER — Ambulatory Visit (INDEPENDENT_AMBULATORY_CARE_PROVIDER_SITE_OTHER): Payer: 59 | Admitting: Physician Assistant

## 2015-05-14 VITALS — BP 118/50 | HR 89 | Temp 97.9°F | Ht 63.0 in | Wt 133.8 lb

## 2015-05-14 DIAGNOSIS — L259 Unspecified contact dermatitis, unspecified cause: Secondary | ICD-10-CM

## 2015-05-14 MED ORDER — CLOBETASOL PROPIONATE 0.05 % EX OINT: 1.0000 "application " | TOPICAL_OINTMENT | Freq: Two times a day (BID) | CUTANEOUS | Status: DC

## 2015-05-14 NOTE — Progress Notes (Signed)
Patient presents to clinic today c/o pruritic rash of bilateral lower extremities x 2 weeks. Endorses rash blistery initially but states now has "dried up". Endorses still itchy and pruritic. First noted after working in her garden. Denies contact with similar rash. Denies fever, chills, malaise.  Past Medical History  Diagnosis Date  . Skin cancer   . Hypertension   . Uterine fibroid   . Osteopenia   . Cerumen impaction     chronic  . Other and unspecified hyperlipidemia 03/19/2013  . Hyperglycemia 03/19/2013  . Cervical cancer screening 03/12/2015    Menarche at 10 Regular and moderate flow  history of abnormal pap in past, bx and cryo once many years ago, normal since. Last 2011 and normal G0P0, s/p No history of abnormal MGM Last in 2015, no breast concerns  No concerns today Biopsy, cervical: gyn surgeries Menopause at 71 ish    Current Outpatient Prescriptions on File Prior to Visit  Medication Sig Dispense Refill  . Krill Oil CAPS Take by mouth daily.     No current facility-administered medications on file prior to visit.    No Known Allergies  Family History  Problem Relation Age of Onset  . Osteoporosis Mother   . Hyperlipidemia Mother   . Hypertension Mother   . Heart disease Father   . Diabetes Father   . Hyperlipidemia Father   . Hypertension Father   . Diabetes Sister   . Heart disease Sister     s/p cardiac stent  . Hyperlipidemia Sister   . Hypertension Sister   . Kidney disease Other   . Alcohol abuse Other     History   Social History  . Marital Status: Married    Spouse Name: N/A  . Number of Children: N/A  . Years of Education: N/A   Social History Main Topics  . Smoking status: Never Smoker   . Smokeless tobacco: Never Used  . Alcohol Use: Yes  . Drug Use: No  . Sexual Activity: Yes     Comment: lives with husband, works at [employer], no dietary restricitons, exercises with strength training, elliptical etc   Other Topics  Concern  . None   Social History Narrative    Review of Systems - See HPI.  All other ROS are negative.  BP 118/50 mmHg  Pulse 89  Temp(Src) 97.9 F (36.6 C) (Oral)  Ht _0  (1.6 m)  Wt 133 lb 12.8 oz (60.691 kg)  BMI 23.71 kg/m2  SpO2 97%  Physical Exam  Constitutional: She is oriented to person, place, and time and well-developed, well-nourished, and in no distress.  HENT:  Head: Normocephalic and atraumatic.  Cardiovascular: Normal rate, regular rhythm, normal heart sounds and intact distal pulses.   Pulmonary/Chest: Effort normal and breath sounds normal. No respiratory distress. She has no wheezes. She has no rales. She exhibits no tenderness.  Neurological: She is alert and oriented to person, place, and time.  Skin: Skin is warm and dry. Rash noted.  Psychiatric: Affect normal.  Vitals reviewed.   Recent Results (from the past 2160 hour(s))  Cytology - PAP     Status: None   Collection Time: 03/12/15 12:00 AM  Result Value Ref Range   CYTOLOGY - PAP PAP RESULT   Vitamin D 1,25 dihydroxy     Status: Abnormal   Collection Time: 03/12/15 10:51 AM  Result Value Ref Range   Vitamin D 1, 25 (OH)2 Total 76 (H) 18 - 72 pg/mL  Vitamin D3 1, 25 (OH)2 76 pg/mL   Vitamin D2 1, 25 (OH)2 <8 pg/mL    Comment: Vitamin D3, 1,25(OH)2 indicates both endogenous production and supplementation.  Vitamin D2, 1,25(OH)2 is an indicator of exogeous sources, such as diet or supplementation.  Interpretation and therapy are based on measurement of Vitamin D,1,25(OH)2, Total. This test was developed and its analytical performance characteristics have been determined by [facility], [place], [place]. It has not been cleared or approved by the FDA. This assay has been validated pursuant to the CLIA regulations and is used for clinical purposes.   TSH     Status: None   Collection Time: 03/12/15 10:51 AM  Result Value Ref Range   TSH 2.43 0.35 - 4.50 uIU/mL  Comp  Met (CMET)     Status: Abnormal   Collection Time: 03/12/15 10:51 AM  Result Value Ref Range   Sodium 138 135 - 145 mEq/L   Potassium 3.7 3.5 - 5.1 mEq/L   Chloride 103 96 - 112 mEq/L   CO2 30 19 - 32 mEq/L   Glucose, Bld 105 (H) 70 - 99 mg/dL   BUN 18 6 - 23 mg/dL   Creatinine, Ser 0.67 0.40 - 1.20 mg/dL   Total Bilirubin 0.5 0.2 - 1.2 mg/dL   Alkaline Phosphatase 53 39 - 117 U/L   AST 17 0 - 37 U/L   ALT 14 0 - 35 U/L   Total Protein 6.9 6.0 - 8.3 g/dL   Albumin 4.5 3.5 - 5.2 g/dL   Calcium 9.9 8.4 - 10.5 mg/dL   GFR 95.30 >60.00 mL/min  Lipid Profile     Status: Abnormal   Collection Time: 03/12/15 10:51 AM  Result Value Ref Range   Cholesterol 223 (H) 0 - 200 mg/dL    Comment: ATP III Classification       Desirable:  < 200 mg/dL               Borderline High:  200 - 239 mg/dL          High:  > = 240 mg/dL   Triglycerides 151.0 (H) 0.0 - 149.0 mg/dL    Comment: Normal:  <150 mg/dLBorderline High:  150 - 199 mg/dL   HDL 53.80 >39.00 mg/dL   VLDL 30.2 0.0 - 40.0 mg/dL   LDL Cholesterol 139 (H) 0 - 99 mg/dL   Total CHOL/HDL Ratio 4     Comment:                Men          Women1/2 Average Risk     3.4          3.3Average Risk          5.0          4.42X Average Risk          9.6          7.13X Average Risk          15.0          11.0                       NonHDL 169.20     Comment: NOTE:  Non-HDL goal should be 30 mg/dL higher than patient's LDL goal (i.e. LDL goal of < 70 mg/dL, would have non-HDL goal of < 100 mg/dL)  CBC     Status: Abnormal   Collection Time: 03/12/15 10:51 AM  Result Value Ref Range   WBC 5.9 4.0 - 10.5 K/uL   RBC 4.84 3.87 - 5.11 Mil/uL   Platelets 370.0 150.0 - 400.0 K/uL   Hemoglobin 15.1 (H) 12.0 - 15.0 g/dL   HCT 44.4 36.0 - 46.0 %   MCV 91.6 78.0 - 100.0 fl   MCHC 34.2 30.0 - 36.0 g/dL   RDW 12.3 11.5 - 15.5 %  HgB A1c     Status: None   Collection Time: 03/12/15 10:51 AM  Result Value Ref Range   Hgb A1c MFr Bld 5.4 4.6 - 6.5 %    Comment:  Glycemic Control Guidelines for People with Diabetes:Non Diabetic:  <6%Goal of Therapy: <7%Additional Action Suggested:  >8%   Urine Culture     Status: None   Collection Time: 03/12/15 10:51 AM  Result Value Ref Range   Colony Count 9,000 COLONIES/ML    Organism ID, Bacteria Insignificant Growth     Assessment/Plan: Contact dermatitis Resolving. No new lesions. Rx Temovate BID. Supportive measures reviewed. Follow-up if not resolving.

## 2015-05-14 NOTE — Patient Instructions (Signed)
Please use the clobetasol ointment twice daily as directed. Wear socks if you will be in the garden or in weeds. I am glad symptoms are already improving. You can apply topical Sarna lotion if needed for itch.  Follow-up if not resolving.

## 2015-05-14 NOTE — Progress Notes (Signed)
Pre visit review using our clinic review tool, if applicable. No additional management support is needed unless otherwise documented below in the visit note. 

## 2015-05-15 NOTE — Assessment & Plan Note (Signed)
Resolving. No new lesions. Rx Temovate BID. Supportive measures reviewed. Follow-up if not resolving.

## 2015-06-15 ENCOUNTER — Ambulatory Visit: Payer: 59 | Admitting: Family Medicine

## 2015-07-09 ENCOUNTER — Ambulatory Visit (INDEPENDENT_AMBULATORY_CARE_PROVIDER_SITE_OTHER): Payer: 59 | Admitting: Family Medicine

## 2015-07-09 ENCOUNTER — Encounter: Payer: Self-pay | Admitting: Family Medicine

## 2015-07-09 VITALS — BP 132/77 | HR 77 | Temp 98.1°F | Ht 63.0 in | Wt 131.2 lb

## 2015-07-09 DIAGNOSIS — R030 Elevated blood-pressure reading, without diagnosis of hypertension: Secondary | ICD-10-CM | POA: Diagnosis not present

## 2015-07-09 DIAGNOSIS — E875 Hyperkalemia: Secondary | ICD-10-CM

## 2015-07-09 DIAGNOSIS — Z23 Encounter for immunization: Secondary | ICD-10-CM | POA: Diagnosis not present

## 2015-07-09 DIAGNOSIS — R739 Hyperglycemia, unspecified: Secondary | ICD-10-CM

## 2015-07-09 DIAGNOSIS — E559 Vitamin D deficiency, unspecified: Secondary | ICD-10-CM

## 2015-07-09 DIAGNOSIS — M810 Age-related osteoporosis without current pathological fracture: Secondary | ICD-10-CM | POA: Diagnosis not present

## 2015-07-09 DIAGNOSIS — E785 Hyperlipidemia, unspecified: Secondary | ICD-10-CM

## 2015-07-09 DIAGNOSIS — E782 Mixed hyperlipidemia: Secondary | ICD-10-CM

## 2015-07-09 LAB — COMPREHENSIVE METABOLIC PANEL
ALK PHOS: 50 U/L (ref 39–117)
ALT: 13 U/L (ref 0–35)
AST: 15 U/L (ref 0–37)
Albumin: 4.5 g/dL (ref 3.5–5.2)
BUN: 18 mg/dL (ref 6–23)
CALCIUM: 9.9 mg/dL (ref 8.4–10.5)
CO2: 31 mEq/L (ref 19–32)
Chloride: 106 mEq/L (ref 96–112)
Creatinine, Ser: 0.76 mg/dL (ref 0.40–1.20)
GFR: 82.31 mL/min (ref >60.00)
GLUCOSE: 97 mg/dL (ref 70–99)
Potassium: 5.4 mEq/L — ABNORMAL HIGH (ref 3.5–5.1)
Sodium: 143 mEq/L (ref 135–145)
Total Bilirubin: 0.7 mg/dL (ref 0.2–1.2)
Total Protein: 7 g/dL (ref 6.0–8.3)

## 2015-07-09 LAB — VITAMIN D 25 HYDROXY (VIT D DEFICIENCY, FRACTURES): VITD: 47.43 ng/mL (ref 30.00–100.00)

## 2015-07-09 NOTE — Progress Notes (Signed)
Pre visit review using our clinic review tool, if applicable. No additional management support is needed unless otherwise documented below in the visit note. 

## 2015-07-09 NOTE — Assessment & Plan Note (Addendum)
Calcium tid, check vit d today, took [place] for 5 years several years ago

## 2015-07-09 NOTE — Patient Instructions (Addendum)
Prolia new treatment for osteoporosis   Osteoporosis Throughout your life, your body breaks down old bone and replaces it with new bone. As you get older, your body does not replace bone as quickly as it breaks it down. By the age of 40 years, most people begin to gradually lose bone because of the imbalance between bone loss and replacement. Some people lose more bone than others. Bone loss beyond a specified normal degree is considered osteoporosis.  Osteoporosis affects the strength and durability of your bones. The inside of the ends of your bones and your flat bones, like the bones of your pelvis, look like honeycomb, filled with tiny open spaces. As bone loss occurs, your bones become less dense. This means that the open spaces inside your bones become bigger and the walls between these spaces become thinner. This makes your bones weaker. Bones of a person with osteoporosis can become so weak that they can break (fracture) during minor accidents, such as a simple fall. CAUSES  The following factors have been associated with the development of osteoporosis:  Smoking.  Drinking more than 2 alcoholic drinks several days per week.  Long-term use of certain medicines:  Corticosteroids.  Chemotherapy medicines.  Thyroid medicines.  Antiepileptic medicines.  Gonadal hormone suppression medicine.  Immunosuppression medicine.  Being underweight.  Lack of physical activity.  Lack of exposure to the sun. This can lead to vitamin D deficiency.  Certain medical conditions:  Certain inflammatory bowel diseases, such as Crohn disease and ulcerative colitis.  Diabetes.  Hyperthyroidism.  Hyperparathyroidism. RISK FACTORS Anyone can develop osteoporosis. However, the following factors can increase your risk of developing osteoporosis:  Gender--Women are at higher risk than men.  Age--Being older than 50 years increases your risk.  Ethnicity--White and Asian people have an  increased risk.  Weight --Being extremely underweight can increase your risk of osteoporosis.  Family history of osteoporosis--Having a family member who has developed osteoporosis can increase your risk. SYMPTOMS  Usually, people with osteoporosis have no symptoms.  DIAGNOSIS  Signs during a physical exam that may prompt your caregiver to suspect osteoporosis include:  Decreased height. This is usually caused by the compression of the bones that form your spine (vertebrae) because they have weakened and become fractured.  A curving or rounding of the upper back (kyphosis). To confirm signs of osteoporosis, your caregiver may request a procedure that uses 2 low-dose X-ray beams with different levels of energy to measure your bone mineral density (dual-energy X-ray absorptiometry [DXA]). Also, your caregiver may check your level of vitamin D. TREATMENT  The goal of osteoporosis treatment is to strengthen bones in order to decrease the risk of bone fractures. There are different types of medicines available to help achieve this goal. Some of these medicines work by slowing the processes of bone loss. Some medicines work by increasing bone density. Treatment also involves making sure that your levels of calcium and vitamin D are adequate. PREVENTION  There are things you can do to help prevent osteoporosis. Adequate intake of calcium and vitamin D can help you achieve optimal bone mineral density. Regular exercise can also help, especially resistance and weight-bearing activities. If you smoke, quitting smoking is an important part of osteoporosis prevention. MAKE SURE YOU:  Understand these instructions.  Will watch your condition.  Will get help right away if you are not doing well or get worse. FOR MORE INFORMATION www.osteo.org and [employer].com.ee Document Released: 07/12/2005 Document Revised: 01/27/2013 Document Reviewed: 09/16/2011 [employer] Patient Information  2015 [employer]. This  information is not intended to replace advice given to you by your health care provider. Make sure you discuss any questions you have with your health care provider.

## 2015-07-09 NOTE — Progress Notes (Signed)
Patient ID: Yolanda Hernandez, female   DOB: Feb 19, 1956, 59 y.o.   MRN: [id number]    Subjective:    Patient ID: Yolanda Hernandez, female    DOB: 20-Sep-1956, 59 y.o.   MRN: [id number]  Chief Complaint  Patient presents with  . Follow-up    osteoporosis    HPI Patient is in today for follow-up. Overall feeling well is here today to discuss her recent bone density findings which showed osteoporosis. She did take Boniva in the past for 5 years and tolerated it well but has been off for several years. No other recent illness or acute concerns. Does try and stay active and maintain calcium in her diet . Denies CP/palp/SOB/HA/congestion/fevers/GI or GU c/o. Taking meds as prescribed  Past Medical History  Diagnosis Date  . Skin cancer   . Hypertension   . Uterine fibroid   . Osteopenia   . Cerumen impaction     chronic  . Other and unspecified hyperlipidemia 03/19/2013  . Hyperglycemia 03/19/2013  . Cervical cancer screening 03/12/2015    Menarche at 10 Regular and moderate flow  history of abnormal pap in past, bx and cryo once many years ago, normal since. Last 2011 and normal G0P0, s/p No history of abnormal MGM Last in 2015, no breast concerns  No concerns today Biopsy, cervical: gyn surgeries Menopause at 89 ish  . Osteoporosis 04/11/2010    Qualifier: Diagnosis of  By: Wynona Luna     Past Surgical History  Procedure Laterality Date  . Breast surgery      biopsy left  . Myringoplasty  1972  . Tonsillectomy      Family History  Problem Relation Age of Onset  . Osteoporosis Mother   . Hyperlipidemia Mother   . Hypertension Mother   . Heart disease Father   . Diabetes Father   . Hyperlipidemia Father   . Hypertension Father   . Diabetes Sister   . Heart disease Sister     s/p cardiac stent  . Hyperlipidemia Sister   . Hypertension Sister   . Kidney disease Other   . Alcohol abuse Other     Social History   Social History  . Marital Status: Married    Spouse Name:  N/A  . Number of Children: N/A  . Years of Education: N/A   Occupational History  . Not on file.   Social History Main Topics  . Smoking status: Never Smoker   . Smokeless tobacco: Never Used  . Alcohol Use: Yes  . Drug Use: No  . Sexual Activity: Yes     Comment: lives with husband, works at [employer], no dietary restricitons, exercises with strength training, elliptical etc   Other Topics Concern  . Not on file   Social History Narrative    Outpatient Prescriptions Prior to Visit  Medication Sig Dispense Refill  . clobetasol ointment (TEMOVATE) 6.28 % Apply 1 application topically 2 (two) times daily. 30 g 0  . Krill Oil CAPS Take by mouth daily.    Marland Kitchen OVER THE COUNTER MEDICATION Calcium and Magnesium Powder-2 teaspoon daily.     No facility-administered medications prior to visit.    No Known Allergies  Review of Systems  Constitutional: Negative for fever and malaise/fatigue.  HENT: Negative for congestion.   Eyes: Negative for discharge.  Respiratory: Negative for shortness of breath.   Cardiovascular: Negative for chest pain, palpitations and leg swelling.  Gastrointestinal: Negative for nausea and abdominal pain.  Genitourinary: Negative for dysuria.  Musculoskeletal: Negative for falls.  Skin: Negative for rash.  Neurological: Negative for loss of consciousness and headaches.  Endo/Heme/Allergies: Negative for environmental allergies.  Psychiatric/Behavioral: Negative for depression. The patient is not nervous/anxious.        Objective:    Physical Exam  BP 132/77 mmHg  Pulse 77  Temp(Src) 98.1 F (36.7 C) (Oral)  Ht 5\' 3"  (1.6 m)  Wt 131 lb 4 oz (59.535 kg)  BMI 23.26 kg/m2  SpO2 99% Wt Readings from Last 3 Encounters:  07/09/15 131 lb 4 oz (59.535 kg)  05/14/15 133 lb 12.8 oz (60.691 kg)  03/12/15 134 lb 4 oz (60.895 kg)     Lab Results  Component Value Date   WBC 5.9 03/12/2015   HGB 15.1* 03/12/2015   HCT 44.4 03/12/2015    PLT 370.0 03/12/2015   GLUCOSE 97 07/09/2015   CHOL 223* 03/12/2015   TRIG 151.0* 03/12/2015   HDL 53.80 03/12/2015   LDLCALC 139* 03/12/2015   ALT 13 07/09/2015   AST 15 07/09/2015   NA 143 07/09/2015   K 5.4* 07/09/2015   CL 106 07/09/2015   CREATININE 0.76 07/09/2015   BUN 18 07/09/2015   CO2 31 07/09/2015   TSH 2.43 03/12/2015   HGBA1C 5.4 03/12/2015    Lab Results  Component Value Date   TSH 2.43 03/12/2015   Lab Results  Component Value Date   WBC 5.9 03/12/2015   HGB 15.1* 03/12/2015   HCT 44.4 03/12/2015   MCV 91.6 03/12/2015   PLT 370.0 03/12/2015   Lab Results  Component Value Date   NA 143 07/09/2015   K 5.4* 07/09/2015   CO2 31 07/09/2015   GLUCOSE 97 07/09/2015   BUN 18 07/09/2015   CREATININE 0.76 07/09/2015   BILITOT 0.7 07/09/2015   ALKPHOS 50 07/09/2015   AST 15 07/09/2015   ALT 13 07/09/2015   PROT 7.0 07/09/2015   ALBUMIN 4.5 07/09/2015   CALCIUM 9.9 07/09/2015   GFR 82.31 07/09/2015   Lab Results  Component Value Date   CHOL 223* 03/12/2015   Lab Results  Component Value Date   HDL 53.80 03/12/2015   Lab Results  Component Value Date   LDLCALC 139* 03/12/2015   Lab Results  Component Value Date   TRIG 151.0* 03/12/2015   Lab Results  Component Value Date   CHOLHDL 4 03/12/2015   Lab Results  Component Value Date   HGBA1C 5.4 03/12/2015       Assessment & Plan:   Problem List Items Addressed This Visit    Osteoporosis    Calcium tid, check vit d today, took Boniva for 5 years several years ago      Relevant Orders   Comprehensive metabolic panel (Completed)   Vit D  25 hydroxy (rtn osteoporosis monitoring)   Vitamin B12   Hyperlipidemia, mixed    Encouraged heart healthy diet, increase exercise, avoid trans fats, consider a krill oil cap daily      Hyperglycemia   Relevant Orders   Hemoglobin A1c   ELEVATED BLOOD PRESSURE WITHOUT DIAGNOSIS OF HYPERTENSION    Well controlled. Encouraged heart healthy diet  such as the DASH diet and exercise as tolerated.       Relevant Orders   CBC   Comprehensive metabolic panel   TSH    Other Visit Diagnoses    Encounter for immunization    -  Primary    Vitamin D deficiency  Relevant Orders    Vit D  25 hydroxy (rtn osteoporosis monitoring) (Completed)    Comprehensive metabolic panel (Completed)    Vit D  25 hydroxy (rtn osteoporosis monitoring)    Hyperlipidemia        Relevant Orders    Lipid panel    Hyperkalemia        Relevant Orders    Comp Met (CMET)       I am having Yolanda Hernandez maintain her [employer], OVER THE COUNTER MEDICATION, and clobetasol ointment.  No orders of the defined types were placed in this encounter.     Penni Homans, MD

## 2015-07-16 NOTE — Assessment & Plan Note (Signed)
Encouraged heart healthy diet, increase exercise, avoid trans fats, consider a krill oil cap daily 

## 2015-07-16 NOTE — Assessment & Plan Note (Signed)
Well controlled. Encouraged heart healthy diet such as the DASH diet and exercise as tolerated.  

## 2015-08-11 ENCOUNTER — Other Ambulatory Visit (INDEPENDENT_AMBULATORY_CARE_PROVIDER_SITE_OTHER): Payer: 59

## 2015-08-11 DIAGNOSIS — E875 Hyperkalemia: Secondary | ICD-10-CM

## 2015-08-11 LAB — COMPREHENSIVE METABOLIC PANEL
ALK PHOS: 53 U/L (ref 39–117)
ALT: 13 U/L (ref 0–35)
AST: 15 U/L (ref 0–37)
Albumin: 4 g/dL (ref 3.5–5.2)
BILIRUBIN TOTAL: 0.5 mg/dL (ref 0.2–1.2)
BUN: 15 mg/dL (ref 6–23)
CALCIUM: 9.4 mg/dL (ref 8.4–10.5)
CO2: 32 meq/L (ref 19–32)
CREATININE: 0.7 mg/dL (ref 0.40–1.20)
Chloride: 105 mEq/L (ref 96–112)
GFR: 90.47 mL/min (ref >60.00)
GLUCOSE: 76 mg/dL (ref 70–99)
Potassium: 3.7 mEq/L (ref 3.5–5.1)
Sodium: 142 mEq/L (ref 135–145)
TOTAL PROTEIN: 6.3 g/dL (ref 6.0–8.3)

## 2015-11-03 LAB — HM MAMMOGRAPHY: HM Mammogram: NEGATIVE

## 2015-11-09 ENCOUNTER — Encounter: Payer: Self-pay | Admitting: Family Medicine

## 2015-11-29 DIAGNOSIS — H905 Unspecified sensorineural hearing loss: Secondary | ICD-10-CM | POA: Insufficient documentation

## 2015-11-29 DIAGNOSIS — H9312 Tinnitus, left ear: Secondary | ICD-10-CM | POA: Insufficient documentation

## 2015-11-29 DIAGNOSIS — H6123 Impacted cerumen, bilateral: Secondary | ICD-10-CM | POA: Insufficient documentation

## 2015-11-29 DIAGNOSIS — H903 Sensorineural hearing loss, bilateral: Secondary | ICD-10-CM | POA: Insufficient documentation

## 2016-04-11 ENCOUNTER — Ambulatory Visit (INDEPENDENT_AMBULATORY_CARE_PROVIDER_SITE_OTHER): Payer: 59 | Admitting: Family Medicine

## 2016-04-11 ENCOUNTER — Encounter: Payer: Self-pay | Admitting: Family Medicine

## 2016-04-11 VITALS — BP 110/68 | HR 74 | Temp 98.3°F | Ht 63.0 in | Wt 132.2 lb

## 2016-04-11 DIAGNOSIS — L259 Unspecified contact dermatitis, unspecified cause: Secondary | ICD-10-CM

## 2016-04-11 DIAGNOSIS — R030 Elevated blood-pressure reading, without diagnosis of hypertension: Secondary | ICD-10-CM

## 2016-04-11 DIAGNOSIS — L239 Allergic contact dermatitis, unspecified cause: Secondary | ICD-10-CM

## 2016-04-11 DIAGNOSIS — L200 Besnier's prurigo: Secondary | ICD-10-CM

## 2016-04-11 MED ORDER — RANITIDINE HCL 150 MG PO TABS: 150.0000 mg | ORAL_TABLET | Freq: Two times a day (BID) | ORAL | Status: DC

## 2016-04-11 MED ORDER — CETIRIZINE HCL 10 MG PO TABS: 10.0000 mg | ORAL_TABLET | Freq: Two times a day (BID) | ORAL | Status: DC

## 2016-04-11 NOTE — Patient Instructions (Signed)
Contact Dermatitis Dermatitis is redness, soreness, and swelling (inflammation) of the skin. Contact dermatitis is a reaction to certain substances that touch the skin. There are two types of contact dermatitis:   Irritant contact dermatitis. This type is caused by something that irritates your skin, such as dry hands from washing them too much. This type does not require previous exposure to the substance for a reaction to occur. This type is more common.  Allergic contact dermatitis. This type is caused by a substance that you are allergic to, such as a nickel allergy or poison ivy. This type only occurs if you have been exposed to the substance (allergen) before. Upon a repeat exposure, your body reacts to the substance. This type is less common. CAUSES  Many different substances can cause contact dermatitis. Irritant contact dermatitis is most commonly caused by exposure to:   Makeup.   Soaps.   Detergents.   Bleaches.   Acids.   Metal salts, such as nickel.  Allergic contact dermatitis is most commonly caused by exposure to:   Poisonous plants.   Chemicals.   Jewelry.   Latex.   Medicines.   Preservatives in products, such as clothing.  RISK FACTORS This condition is more likely to develop in:   People who have jobs that expose them to irritants or allergens.  People who have certain medical conditions, such as asthma or eczema.  SYMPTOMS  Symptoms of this condition may occur anywhere on your body where the irritant has touched you or is touched by you. Symptoms include:  Dryness or flaking.   Redness.   Cracks.   Itching.   Pain or a burning feeling.   Blisters.  Drainage of small amounts of blood or clear fluid from skin cracks. With allergic contact dermatitis, there may also be swelling in areas such as the eyelids, mouth, or genitals.  DIAGNOSIS  This condition is diagnosed with a medical history and physical exam. A patch skin test  may be performed to help determine the cause. If the condition is related to your job, you may need to see an occupational medicine specialist. TREATMENT Treatment for this condition includes figuring out what caused the reaction and protecting your skin from further contact. Treatment may also include:   Steroid creams or ointments. Oral steroid medicines may be needed in more severe cases.  Antibiotics or antibacterial ointments, if a skin infection is present.  Antihistamine lotion or an antihistamine taken by mouth to ease itching.  A bandage (dressing). HOME CARE INSTRUCTIONS Skin Care  Moisturize your skin as needed.   Apply cool compresses to the affected areas.  Try taking a bath with:  Epsom salts. Follow the instructions on the packaging. You can get these at your local pharmacy or grocery store.  Baking soda. Pour a small amount into the bath as directed by your health care provider.  Colloidal oatmeal. Follow the instructions on the packaging. You can get this at your local pharmacy or grocery store.  Try applying baking soda paste to your skin. Stir water into baking soda until it reaches a paste-like consistency.  Do not scratch your skin.  Bathe less frequently, such as every other day.  Bathe in lukewarm water. Avoid using hot water. Medicines  Take or apply over-the-counter and prescription medicines only as told by your health care provider.   If you were prescribed an antibiotic medicine, take or apply your antibiotic as told by your health care provider. Do not stop using the   antibiotic even if your condition starts to improve. General Instructions  Keep all follow-up visits as told by your health care provider. This is important.  Avoid the substance that caused your reaction. If you do not know what caused it, keep a journal to try to track what caused it. Write down:  What you eat.  What cosmetic products you use.  What you drink.  What  you wear in the affected area. This includes jewelry.  If you were given a dressing, take care of it as told by your health care provider. This includes when to change and remove it. SEEK MEDICAL CARE IF:   Your condition does not improve with treatment.  Your condition gets worse.  You have signs of infection such as swelling, tenderness, redness, soreness, or warmth in the affected area.  You have a fever.  You have new symptoms. SEEK IMMEDIATE MEDICAL CARE IF:   You have a severe headache, neck pain, or neck stiffness.  You vomit.  You feel very sleepy.  You notice red streaks coming from the affected area.  Your bone or joint underneath the affected area becomes painful after the skin has healed.  The affected area turns darker.  You have difficulty breathing.   This information is not intended to replace advice given to you by your health care provider. Make sure you discuss any questions you have with your health care provider.   Document Released: 09/29/2000 Document Revised: 06/23/2015 Document Reviewed: 02/17/2015 Elsevier Interactive Patient Education 2016 Else[employer] Inc[employer]  

## 2016-04-11 NOTE — Progress Notes (Signed)
Pre visit review using our clinic review tool, if applicable. No additional management support is needed unless otherwise documented below in the visit note. 

## 2016-04-13 ENCOUNTER — Encounter: Payer: Self-pay | Admitting: Family Medicine

## 2016-04-16 NOTE — Assessment & Plan Note (Addendum)
Wash with [employer] washing liquid, [employer] and given a steroid cream. Referred to dermatology for further consideration if lesions do not resolve. encouraged Zyrtec and Zantac bid

## 2016-04-16 NOTE — Progress Notes (Signed)
Patient ID: Yolanda Hernandez, female   DOB: May 18, 1956, 60 y.o.   MRN: [id number]   Subjective:    Patient ID: Yolanda Hernandez, female    DOB: 03-08-1956, 60 y.o.   MRN: [id number]  Chief Complaint  Patient presents with  . Rash    HPI Patient is in today for evaluation of a rash. She notes mild itching but is mostly concerned about it spreading was initially on arms but is now also on trunk. No other acute complaints today. Denies CP/palp/SOB/HA/congestion/fevers/GI or GU c/o. Taking meds as prescribed  Past Medical History  Diagnosis Date  . Skin cancer   . Hypertension   . Uterine fibroid   . Osteopenia   . Cerumen impaction     chronic  . Other and unspecified hyperlipidemia 03/19/2013  . Hyperglycemia 03/19/2013  . Cervical cancer screening 03/12/2015    Menarche at 10 Regular and moderate flow  history of abnormal pap in past, bx and cryo once many years ago, normal since. Last 2011 and normal G0P0, s/p No history of abnormal MGM Last in 2015, no breast concerns  No concerns today Biopsy, cervical: gyn surgeries Menopause at 18 ish  . Osteoporosis 04/11/2010    Qualifier: Diagnosis of  By: Wynona Luna     Past Surgical History  Procedure Laterality Date  . Breast surgery      biopsy left  . Myringoplasty  1972  . Tonsillectomy      Family History  Problem Relation Age of Onset  . Osteoporosis Mother   . Hyperlipidemia Mother   . Hypertension Mother   . Heart disease Father   . Diabetes Father   . Hyperlipidemia Father   . Hypertension Father   . Diabetes Sister   . Heart disease Sister     s/p cardiac stent  . Hyperlipidemia Sister   . Hypertension Sister   . Kidney disease Other   . Alcohol abuse Other     Social History   Social History  . Marital Status: Married    Spouse Name: N/A  . Number of Children: N/A  . Years of Education: N/A   Occupational History  . Not on file.   Social History Main Topics  . Smoking status: Never Smoker   .  Smokeless tobacco: Never Used  . Alcohol Use: Yes  . Drug Use: No  . Sexual Activity: Yes     Comment: lives with husband, works at [employer], no dietary restricitons, exercises with strength training, elliptical etc   Other Topics Concern  . Not on file   Social History Narrative    Outpatient Prescriptions Prior to Visit  Medication Sig Dispense Refill  . clobetasol ointment (TEMOVATE) [id number] % Apply 1 application topically 2 (two) times daily. 30 g 0  . Krill Oil CAPS Take by mouth daily.    Marland Kitchen OVER THE COUNTER MEDICATION Calcium and Magnesium Powder-2 teaspoon daily.     No facility-administered medications prior to visit.    No Known Allergies  Review of Systems  Constitutional: Negative for fever and malaise/fatigue.  HENT: Negative for congestion.   Eyes: Negative for blurred vision.  Respiratory: Negative for shortness of breath.   Cardiovascular: Negative for chest pain, palpitations and leg swelling.  Gastrointestinal: Negative for nausea, abdominal pain and blood in stool.  Genitourinary: Negative for dysuria and frequency.  Musculoskeletal: Negative for falls.  Skin: Positive for itching and rash.  Neurological: Negative for dizziness, loss of consciousness and headaches.  Endo/Heme/Allergies: Negative for environmental allergies.  Psychiatric/Behavioral: Negative for depression. The patient is not nervous/anxious.        Objective:    Physical Exam  Constitutional: She is oriented to person, place, and time. She appears well-developed and well-nourished. No distress.  HENT:  Head: Normocephalic and atraumatic.  Nose: Nose normal.  Eyes: Right eye exhibits no discharge. Left eye exhibits no discharge.  Neck: Normal range of motion. Neck supple.  Cardiovascular: Normal rate and regular rhythm.   No murmur heard. Pulmonary/Chest: Effort normal and breath sounds normal.  Abdominal: Soft. Bowel sounds are normal. There is no tenderness.    Musculoskeletal: She exhibits no edema.  Neurological: She is alert and oriented to person, place, and time.  Skin: Skin is warm and dry. Rash noted. There is erythema.  Raised oval erythematous lesions on arms and trunk  Psychiatric: She has a normal mood and affect.  Nursing note and vitals reviewed.   BP 110/68 mmHg  Pulse 74  Temp(Src) 98.3 F (36.8 C) (Oral)  Ht 5\' 3"  (1.6 m)  Wt 132 lb 4 oz (59.988 kg)  BMI 23.43 kg/m2  SpO2 97% Wt Readings from Last 3 Encounters:  04/11/16 132 lb 4 oz (59.988 kg)  07/09/15 131 lb 4 oz (59.535 kg)  05/14/15 133 lb 12.8 oz (60.691 kg)     Lab Results  Component Value Date   WBC 5.9 03/12/2015   HGB 15.1* 03/12/2015   HCT 44.4 03/12/2015   PLT 370.0 03/12/2015   GLUCOSE 76 08/11/2015   CHOL 223* 03/12/2015   TRIG 151.0* 03/12/2015   HDL 53.80 03/12/2015   LDLCALC 139* 03/12/2015   ALT 13 08/11/2015   AST 15 08/11/2015   NA 142 08/11/2015   K 3.7 08/11/2015   CL 105 08/11/2015   CREATININE 0.70 08/11/2015   BUN 15 08/11/2015   CO2 32 08/11/2015   TSH 2.43 03/12/2015   HGBA1C 5.4 03/12/2015    Lab Results  Component Value Date   TSH 2.43 03/12/2015   Lab Results  Component Value Date   WBC 5.9 03/12/2015   HGB 15.1* 03/12/2015   HCT 44.4 03/12/2015   MCV 91.6 03/12/2015   PLT 370.0 03/12/2015   Lab Results  Component Value Date   NA 142 08/11/2015   K 3.7 08/11/2015   CO2 32 08/11/2015   GLUCOSE 76 08/11/2015   BUN 15 08/11/2015   CREATININE 0.70 08/11/2015   BILITOT 0.5 08/11/2015   ALKPHOS 53 08/11/2015   AST 15 08/11/2015   ALT 13 08/11/2015   PROT 6.3 08/11/2015   ALBUMIN 4.0 08/11/2015   CALCIUM 9.4 08/11/2015   GFR 90.47 08/11/2015   Lab Results  Component Value Date   CHOL 223* 03/12/2015   Lab Results  Component Value Date   HDL 53.80 03/12/2015   Lab Results  Component Value Date   LDLCALC 139* 03/12/2015   Lab Results  Component Value Date   TRIG 151.0* 03/12/2015   Lab Results   Component Value Date   CHOLHDL 4 03/12/2015   Lab Results  Component Value Date   HGBA1C 5.4 03/12/2015       Assessment & Plan:   Problem List Items Addressed This Visit    ELEVATED BLOOD PRESSURE WITHOUT DIAGNOSIS OF HYPERTENSION    Well controlled. Encouraged heart healthy diet such as the DASH diet and exercise as tolerated.       Contact dermatitis    Wash with Dawn dish washing liquid, [employer] Astringent and given a steroid cream. Referred to dermatology for further consideration if lesions do not resolve. encouraged Zyrtec and Zantac bid       Other Visit Diagnoses    Allergic dermatitis    -  Primary    Relevant Medications    cetirizine (ZYRTEC) 10 MG tablet    ranitidine (ZANTAC) 150 MG tablet    Other Relevant Orders    Ambulatory referral to Dermatology       I am having Yolanda Hernandez start on cetirizine and ranitidine. I am also having her maintain her Krill Oil, OVER THE COUNTER MEDICATION, and clobetasol ointment.  Meds ordered this encounter  Medications  . cetirizine (ZYRTEC) 10 MG tablet    Sig: Take 1 tablet (10 mg total) by mouth 2 (two) times daily.    Dispense:  60 tablet    Refill:  1  . ranitidine (ZANTAC) 150 MG tablet    Sig: Take 1 tablet (150 mg total) by mouth 2 (two) times daily.    Dispense:  60 tablet    Refill:  1     Penni Homans, MD

## 2016-04-16 NOTE — Assessment & Plan Note (Signed)
Well controlled. Encouraged heart healthy diet such as the DASH diet and exercise as tolerated.  

## 2016-05-31 ENCOUNTER — Ambulatory Visit: Payer: 59 | Admitting: Medical

## 2016-05-31 ENCOUNTER — Telehealth: Payer: Self-pay | Admitting: Medical

## 2016-05-31 NOTE — Telephone Encounter (Signed)
Pt husband in office today. He mentioned wife is getting dental procedure/crown. She is very anxious. Through her husband she wants valium maybe night before procedure and procedure prior. Procedure Wed Sept 27, 2017. I have never seen her. Will you rx or does she need to be seen?

## 2016-05-31 NOTE — Telephone Encounter (Signed)
Ok to rx Diazepam 5 mg tabs 1 tab po bid prna anxiety. Disp #5, 0 rf

## 2016-06-01 MED ORDER — DIAZEPAM 5 MG PO TABS: 5.0000 mg | ORAL_TABLET | Freq: Two times a day (BID) | ORAL | 0 refills | Status: DC

## 2016-06-01 NOTE — Addendum Note (Signed)
Addended by: Sharon Seller B on: 06/01/2016 08:12 AM   Modules accepted: Orders

## 2016-06-01 NOTE — Telephone Encounter (Signed)
Called the wife/husband informed of medication sent in.

## 2016-07-14 ENCOUNTER — Ambulatory Visit (INDEPENDENT_AMBULATORY_CARE_PROVIDER_SITE_OTHER): Payer: 59 | Admitting: Family Medicine

## 2016-07-14 ENCOUNTER — Encounter: Payer: Self-pay | Admitting: Family Medicine

## 2016-07-14 VITALS — BP 142/72 | HR 91 | Temp 98.4°F | Wt 133.0 lb

## 2016-07-14 DIAGNOSIS — C4490 Unspecified malignant neoplasm of skin, unspecified: Secondary | ICD-10-CM | POA: Insufficient documentation

## 2016-07-14 DIAGNOSIS — R030 Elevated blood-pressure reading, without diagnosis of hypertension: Secondary | ICD-10-CM | POA: Diagnosis not present

## 2016-07-14 DIAGNOSIS — Z23 Encounter for immunization: Secondary | ICD-10-CM | POA: Diagnosis not present

## 2016-07-14 DIAGNOSIS — E782 Mixed hyperlipidemia: Secondary | ICD-10-CM | POA: Diagnosis not present

## 2016-07-14 DIAGNOSIS — M810 Age-related osteoporosis without current pathological fracture: Secondary | ICD-10-CM

## 2016-07-14 DIAGNOSIS — R739 Hyperglycemia, unspecified: Secondary | ICD-10-CM

## 2016-07-14 DIAGNOSIS — Z0000 Encounter for general adult medical examination without abnormal findings: Secondary | ICD-10-CM

## 2016-07-14 HISTORY — DX: Unspecified malignant neoplasm of skin, unspecified: C44.90

## 2016-07-14 LAB — LIPID PANEL
CHOLESTEROL: 209 mg/dL — AB (ref 0–200)
HDL: 51.6 mg/dL (ref >39.00)
LDL Cholesterol: 125 mg/dL — ABNORMAL HIGH (ref 0–99)
NonHDL: 157.36
TRIGLYCERIDES: 164 mg/dL — AB (ref 0.0–149.0)
Total CHOL/HDL Ratio: 4
VLDL: 32.8 mg/dL (ref 0.0–40.0)

## 2016-07-14 LAB — CBC
HEMATOCRIT: 44.3 % (ref 36.0–46.0)
Hemoglobin: 15.2 g/dL — ABNORMAL HIGH (ref 12.0–15.0)
MCHC: 34.4 g/dL (ref 30.0–36.0)
MCV: 92 fl (ref 78.0–100.0)
PLATELETS: 339 10*3/uL (ref 150.0–400.0)
RBC: 4.81 Mil/uL (ref 3.87–5.11)
RDW: 12.3 % (ref 11.5–15.5)
WBC: 4.8 10*3/uL (ref 4.0–10.5)

## 2016-07-14 LAB — COMPREHENSIVE METABOLIC PANEL
ALBUMIN: 4.1 g/dL (ref 3.5–5.2)
ALK PHOS: 45 U/L (ref 39–117)
ALT: 11 U/L (ref 0–35)
AST: 16 U/L (ref 0–37)
BILIRUBIN TOTAL: 0.6 mg/dL (ref 0.2–1.2)
BUN: 15 mg/dL (ref 6–23)
CALCIUM: 9.3 mg/dL (ref 8.4–10.5)
CO2: 33 mEq/L — ABNORMAL HIGH (ref 19–32)
CREATININE: 0.72 mg/dL (ref 0.40–1.20)
Chloride: 105 mEq/L (ref 96–112)
GFR: 87.31 mL/min (ref >60.00)
Glucose, Bld: 91 mg/dL (ref 70–99)
Potassium: 4.1 mEq/L (ref 3.5–5.1)
SODIUM: 141 meq/L (ref 135–145)
TOTAL PROTEIN: 6.6 g/dL (ref 6.0–8.3)

## 2016-07-14 LAB — VITAMIN D 25 HYDROXY (VIT D DEFICIENCY, FRACTURES): VITD: 46.72 ng/mL (ref 30.00–100.00)

## 2016-07-14 LAB — HEPATITIS C ANTIBODY: HCV Ab: NEGATIVE

## 2016-07-14 LAB — HEMOGLOBIN A1C: Hgb A1c MFr Bld: 5.6 % (ref 4.6–6.5)

## 2016-07-14 LAB — TSH: TSH: 2.28 u[IU]/mL (ref 0.35–4.50)

## 2016-07-14 NOTE — Progress Notes (Signed)
Pre visit review using our clinic review tool, if applicable. No additional management support is needed unless otherwise documented below in the visit note. 

## 2016-07-14 NOTE — Assessment & Plan Note (Signed)
Recurred urticarial in nature twice this past year. Responded to Zyrtec, Zantac, steroid cream bid. Has an appt with dermatology soon in case it occurs

## 2016-07-14 NOTE — Progress Notes (Signed)
Patient ID: Yolanda Hernandez, female   DOB: 1956/04/19, 60 y.o.   MRN: [id number]   Subjective:    Patient ID: Yolanda Hernandez, female    DOB: 05/20/1956, 60 y.o.   MRN: [id number]  Chief Complaint  Patient presents with  . Annual Exam    HPI Patient is in today for annual preventative exam and follow up on numerous chronic medical concerns. Had some trouble with a recurrent rash in June and then in August but the rash is not present today. No other recent illness or acute concerns. No recent hospitalizations. Denies CP/palp/SOB/HA/congestion/fevers/GI or GU c/o. Taking meds as prescribed  Past Medical History:  Diagnosis Date  . Cerumen impaction    chronic  . Cervical cancer screening 03/12/2015   Menarche at 10 Regular and moderate flow  history of abnormal pap in past, bx and cryo once many years ago, normal since. Last 2011 and normal G0P0, s/p No history of abnormal MGM Last in 2015, no breast concerns  No concerns today Biopsy, cervical: gyn surgeries Menopause at 69 ish  . Hyperglycemia 03/19/2013  . Hypertension   . Osteopenia   . Osteoporosis 04/11/2010   Qualifier: Diagnosis of  By: Wynona Luna   . Other and unspecified hyperlipidemia 03/19/2013  . Skin cancer   . Skin cancer 07/14/2016  . Uterine fibroid     Past Surgical History:  Procedure Laterality Date  . BREAST SURGERY     biopsy left  . MYRINGOPLASTY  1972  . TONSILLECTOMY      Family History  Problem Relation Age of Onset  . Osteoporosis Mother   . Hyperlipidemia Mother   . Hypertension Mother   . Heart disease Father   . Diabetes Father   . Hyperlipidemia Father   . Hypertension Father   . Diabetes Sister   . Heart disease Sister     s/p cardiac stent  . Hyperlipidemia Sister   . Hypertension Sister   . Kidney disease Other   . Alcohol abuse Other     Social History   Social History  . Marital status: Married    Spouse name: N/A  . Number of children: N/A  . Years of education: N/A    Occupational History  . Not on file.   Social History Main Topics  . Smoking status: Never Smoker  . Smokeless tobacco: Never Used  . Alcohol use Yes  . Drug use: No  . Sexual activity: Yes     Comment: lives with husband, works at [employer], no dietary restricitons, exercises with strength training, elliptical etc   Other Topics Concern  . Not on file   Social History Narrative  . No narrative on file    Outpatient Medications Prior to Visit  Medication Sig Dispense Refill  . clobetasol ointment (TEMOVATE) [id number] % Apply 1 application topically 2 (two) times daily. 30 g 0  . diazepam (VALIUM) 5 MG tablet Take 1 tablet (5 mg total) by mouth 2 (two) times daily. 5 tablet 0  . Krill Oil CAPS Take by mouth daily.    Marland Kitchen OVER THE COUNTER MEDICATION Calcium and Magnesium Powder-2 teaspoon daily.    . cetirizine (ZYRTEC) 10 MG tablet Take 1 tablet (10 mg total) by mouth 2 (two) times daily. (Patient not taking: Reported on 07/14/2016) 60 tablet 1  . ranitidine (ZANTAC) 150 MG tablet Take 1 tablet (150 mg total) by mouth 2 (two) times daily. (Patient not taking: Reported on 07/14/2016) 60 tablet  1   No facility-administered medications prior to visit.     No Known Allergies  Review of Systems  Constitutional: Negative for chills, fever and malaise/fatigue.  HENT: Negative for congestion and hearing loss.   Eyes: Negative for discharge.  Respiratory: Negative for cough, sputum production and shortness of breath.   Cardiovascular: Negative for chest pain, palpitations and leg swelling.  Gastrointestinal: Negative for abdominal pain, blood in stool, constipation, diarrhea, heartburn, nausea and vomiting.  Genitourinary: Negative for dysuria, frequency, hematuria and urgency.  Musculoskeletal: Negative for back pain, falls and myalgias.  Skin: Negative for rash.  Neurological: Negative for dizziness, sensory change, loss of consciousness, weakness and headaches.   Endo/Heme/Allergies: Negative for environmental allergies. Does not bruise/bleed easily.  Psychiatric/Behavioral: Negative for depression and suicidal ideas. The patient is not nervous/anxious and does not have insomnia.        Objective:    Physical Exam  Constitutional: She is oriented to person, place, and time. She appears well-developed and well-nourished. No distress.  HENT:  Head: Normocephalic and atraumatic.  Right Ear: External ear normal.  Left Ear: External ear normal.  Nose: Nose normal.  Mouth/Throat: Oropharynx is clear and moist.  Eyes: Conjunctivae and EOM are normal. Pupils are equal, round, and reactive to light. Right eye exhibits no discharge. Left eye exhibits no discharge.  Neck: Normal range of motion. Neck supple. No JVD present. No thyromegaly present.  Cardiovascular: Normal rate, regular rhythm, normal heart sounds and intact distal pulses.   No murmur heard. Pulmonary/Chest: Effort normal and breath sounds normal. No respiratory distress. She has no wheezes. She has no rales. She exhibits no tenderness.  Abdominal: Soft. Bowel sounds are normal. She exhibits no distension and no mass. There is no tenderness. There is no rebound and no guarding.  Genitourinary: Rectal exam shows guaiac negative stool. No vaginal discharge found.  Musculoskeletal: Normal range of motion. She exhibits no edema or tenderness.  Lymphadenopathy:    She has no cervical adenopathy.  Neurological: She is alert and oriented to person, place, and time. She has normal reflexes. No cranial nerve deficit.  Skin: Skin is warm and dry. No rash noted. She is not diaphoretic. No erythema.  Psychiatric: She has a normal mood and affect. Her behavior is normal. Judgment and thought content normal.  Nursing note and vitals reviewed.   BP (!) 142/72 (BP Location: Left Arm, Patient Position: Sitting, Cuff Size: Normal)   Pulse 91   Temp 98.4 F (36.9 C) (Oral)   Wt 133 lb (60.3 kg)   SpO2  100%   BMI 23.56 kg/m  Wt Readings from Last 3 Encounters:  07/14/16 133 lb (60.3 kg)  04/11/16 132 lb 4 oz (60 kg)  07/09/15 131 lb 4 oz (59.5 kg)     Lab Results  Component Value Date   WBC 5.9 03/12/2015   HGB 15.1 (H) 03/12/2015   HCT 44.4 03/12/2015   PLT 370.0 03/12/2015   GLUCOSE 76 08/11/2015   CHOL 223 (H) 03/12/2015   TRIG 151.0 (H) 03/12/2015   HDL 53.80 03/12/2015   LDLCALC 139 (H) 03/12/2015   ALT 13 08/11/2015   AST 15 08/11/2015   NA 142 08/11/2015   K 3.7 08/11/2015   CL 105 08/11/2015   CREATININE 0.70 08/11/2015   BUN 15 08/11/2015   CO2 32 08/11/2015   TSH 2.43 03/12/2015   HGBA1C 5.4 03/12/2015    Lab Results  Component Value Date   TSH 2.43 03/12/2015  Lab Results  Component Value Date   WBC 5.9 03/12/2015   HGB 15.1 (H) 03/12/2015   HCT 44.4 03/12/2015   MCV 91.6 03/12/2015   PLT 370.0 03/12/2015   Lab Results  Component Value Date   NA 142 08/11/2015   K 3.7 08/11/2015   CO2 32 08/11/2015   GLUCOSE 76 08/11/2015   BUN 15 08/11/2015   CREATININE 0.70 08/11/2015   BILITOT 0.5 08/11/2015   ALKPHOS 53 08/11/2015   AST 15 08/11/2015   ALT 13 08/11/2015   PROT 6.3 08/11/2015   ALBUMIN 4.0 08/11/2015   CALCIUM 9.4 08/11/2015   GFR 90.47 08/11/2015   Lab Results  Component Value Date   CHOL 223 (H) 03/12/2015   Lab Results  Component Value Date   HDL 53.80 03/12/2015   Lab Results  Component Value Date   LDLCALC 139 (H) 03/12/2015   Lab Results  Component Value Date   TRIG 151.0 (H) 03/12/2015   Lab Results  Component Value Date   CHOLHDL 4 03/12/2015   Lab Results  Component Value Date   HGBA1C 5.4 03/12/2015       Assessment & Plan:   Problem List Items Addressed This Visit    Osteoporosis   Relevant Orders   Comprehensive metabolic panel   CBC   Vitamin D (25 hydroxy)   ELEVATED BLOOD PRESSURE WITHOUT DIAGNOSIS OF HYPERTENSION - Primary    Well controlled. Encouraged heart healthy diet such as the DASH  diet and exercise as tolerated.       Relevant Orders   Comprehensive metabolic panel   CBC   Hemoglobin A1c   Annual physical exam    Patient encouraged to maintain heart healthy diet, regular exercise, adequate sleep. Consider daily probiotics. Take medications as prescribed. Given and reviewed copy of ACP documents from [employer] and encouraged to complete and return      Relevant Orders   Comprehensive metabolic panel   CBC   Hepatitis C antibody screen   Hyperlipidemia, mixed    Encouraged heart healthy diet, increase exercise, avoid trans fats, consider a krill oil cap daily      Relevant Orders   Comprehensive metabolic panel   Lipid panel   CBC   Hyperglycemia    hgba1c acceptable, minimize simple carbs. Increase exercise as tolerated.       Relevant Orders   Comprehensive metabolic panel   TSH   CBC   RESOLVED: Skin cancer    Other Visit Diagnoses    Encounter for immunization       Relevant Orders   Flu Vaccine QUAD 36+ mos IM (Completed)      I am having Ms. Hanf maintain her [employer], OVER THE COUNTER MEDICATION, clobetasol ointment, cetirizine, ranitidine, and diazepam.  No orders of the defined types were placed in this encounter.    Penni Homans, MD

## 2016-07-14 NOTE — Assessment & Plan Note (Signed)
Patient encouraged to maintain heart healthy diet, regular exercise, adequate sleep. Consider daily probiotics. Take medications as prescribed. Given and reviewed copy of ACP documents from [place] Secretary of State and encouraged to complete and return 

## 2016-07-14 NOTE — Assessment & Plan Note (Signed)
Encouraged heart healthy diet, increase exercise, avoid trans fats, consider a krill oil cap daily 

## 2016-07-14 NOTE — Patient Instructions (Signed)
Preventive Care for Adults, Female A healthy lifestyle and preventive care can promote health and wellness. Preventive health guidelines for women include the following key practices.  A routine yearly physical is a good way to check with your health care provider about your health and preventive screening. It is a chance to share any concerns and updates on your health and to receive a thorough exam.  Visit your dentist for a routine exam and preventive care every 6 months. Brush your teeth twice a day and floss once a day. Good oral hygiene prevents tooth decay and gum disease.  The frequency of eye exams is based on your age, health, family medical history, use of contact lenses, and other factors. Follow your health care provider's recommendations for frequency of eye exams.  Eat a healthy diet. Foods like vegetables, fruits, whole grains, low-fat dairy products, and lean protein foods contain the nutrients you need without too many calories. Decrease your intake of foods high in solid fats, added sugars, and salt. Eat the right amount of calories for you.Get information about a proper diet from your health care provider, if necessary.  Regular physical exercise is one of the most important things you can do for your health. Most adults should get at least 150 minutes of moderate-intensity exercise (any activity that increases your heart rate and causes you to sweat) each week. In addition, most adults need muscle-strengthening exercises on 2 or more days a week.  Maintain a healthy weight. The body mass index (BMI) is a screening tool to identify possible weight problems. It provides an estimate of body fat based on height and weight. Your health care provider can find your BMI and can help you achieve or maintain a healthy weight.For adults 20 years and older:  A BMI below 18.5 is considered underweight.  A BMI of 18.5 to 24.9 is normal.  A BMI of 25 to 29.9 is considered overweight.  A  BMI of 30 and above is considered obese.  Maintain normal blood lipids and cholesterol levels by exercising and minimizing your intake of saturated fat. Eat a balanced diet with plenty of fruit and vegetables. Blood tests for lipids and cholesterol should begin at age 45 and be repeated every 5 years. If your lipid or cholesterol levels are high, you are over 50, or you are at high risk for heart disease, you may need your cholesterol levels checked more frequently.Ongoing high lipid and cholesterol levels should be treated with medicines if diet and exercise are not working.  If you smoke, find out from your health care provider how to quit. If you do not use tobacco, do not start.  Lung cancer screening is recommended for adults aged 45-80 years who are at high risk for developing lung cancer because of a history of smoking. A yearly low-dose CT scan of the lungs is recommended for people who have at least a 30-pack-year history of smoking and are a current smoker or have quit within the past 15 years. A pack year of smoking is smoking an average of 1 pack of cigarettes a day for 1 year (for example: 1 pack a day for 30 years or 2 packs a day for 15 years). Yearly screening should continue until the smoker has stopped smoking for at least 15 years. Yearly screening should be stopped for people who develop a health problem that would prevent them from having lung cancer treatment.  If you are pregnant, do not drink alcohol. If you are  breastfeeding, be very cautious about drinking alcohol. If you are not pregnant and choose to drink alcohol, do not have more than 1 drink per day. One drink is considered to be 12 ounces (355 mL) of beer, 5 ounces (148 mL) of wine, or 1.5 ounces (44 mL) of liquor.  Avoid use of street drugs. Do not share needles with anyone. Ask for help if you need support or instructions about stopping the use of drugs.  High blood pressure causes heart disease and increases the risk  of stroke. Your blood pressure should be checked at least every 1 to 2 years. Ongoing high blood pressure should be treated with medicines if weight loss and exercise do not work.  If you are 55-79 years old, ask your health care provider if you should take aspirin to prevent strokes.  Diabetes screening is done by taking a blood sample to check your blood glucose level after you have not eaten for a certain period of time (fasting). If you are not overweight and you do not have risk factors for diabetes, you should be screened once every 3 years starting at age 45. If you are overweight or obese and you are 40-70 years of age, you should be screened for diabetes every year as part of your cardiovascular risk assessment.  Breast cancer screening is essential preventive care for women. You should practice "breast self-awareness." This means understanding the normal appearance and feel of your breasts and may include breast self-examination. Any changes detected, no matter how small, should be reported to a health care provider. Women in their 20s and 30s should have a clinical breast exam (CBE) by a health care provider as part of a regular health exam every 1 to 3 years. After age 40, women should have a CBE every year. Starting at age 40, women should consider having a mammogram (breast X-ray test) every year. Women who have a family history of breast cancer should talk to their health care provider about genetic screening. Women at a high risk of breast cancer should talk to their health care providers about having an MRI and a mammogram every year.  Breast cancer gene (BRCA)-related cancer risk assessment is recommended for women who have family members with BRCA-related cancers. BRCA-related cancers include breast, ovarian, tubal, and peritoneal cancers. Having family members with these cancers may be associated with an increased risk for harmful changes (mutations) in the breast cancer genes BRCA1 and  BRCA2. Results of the assessment will determine the need for genetic counseling and BRCA1 and BRCA2 testing.  Your health care provider may recommend that you be screened regularly for cancer of the pelvic organs (ovaries, uterus, and vagina). This screening involves a pelvic examination, including checking for microscopic changes to the surface of your cervix (Pap test). You may be encouraged to have this screening done every 3 years, beginning at age 21.  For women ages 30-65, health care providers may recommend pelvic exams and Pap testing every 3 years, or they may recommend the Pap and pelvic exam, combined with testing for human papilloma virus (HPV), every 5 years. Some types of HPV increase your risk of cervical cancer. Testing for HPV may also be done on women of any age with unclear Pap test results.  Other health care providers may not recommend any screening for nonpregnant women who are considered low risk for pelvic cancer and who do not have symptoms. Ask your health care provider if a screening pelvic exam is right for   you.  If you have had past treatment for cervical cancer or a condition that could lead to cancer, you need Pap tests and screening for cancer for at least 20 years after your treatment. If Pap tests have been discontinued, your risk factors (such as having a new sexual partner) need to be reassessed to determine if screening should resume. Some women have medical problems that increase the chance of getting cervical cancer. In these cases, your health care provider may recommend more frequent screening and Pap tests.  Colorectal cancer can be detected and often prevented. Most routine colorectal cancer screening begins at the age of 50 years and continues through age 75 years. However, your health care provider may recommend screening at an earlier age if you have risk factors for colon cancer. On a yearly basis, your health care provider may provide home test kits to check  for hidden blood in the stool. Use of a small camera at the end of a tube, to directly examine the colon (sigmoidoscopy or colonoscopy), can detect the earliest forms of colorectal cancer. Talk to your health care provider about this at age 50, when routine screening begins. Direct exam of the colon should be repeated every 5-10 years through age 75 years, unless early forms of precancerous polyps or small growths are found.  People who are at an increased risk for hepatitis B should be screened for this virus. You are considered at high risk for hepatitis B if:  You were born in a country where hepatitis B occurs often. Talk with your health care provider about which countries are considered high risk.  Your parents were born in a high-risk country and you have not received a shot to protect against hepatitis B (hepatitis B vaccine).  You have HIV or AIDS.  You use needles to inject street drugs.  You live with, or have sex with, someone who has hepatitis B.  You get hemodialysis treatment.  You take certain medicines for conditions like cancer, organ transplantation, and autoimmune conditions.  Hepatitis C blood testing is recommended for all people born from 1945 through 1965 and any individual with known risks for hepatitis C.  Practice safe sex. Use condoms and avoid high-risk sexual practices to reduce the spread of sexually transmitted infections (STIs). STIs include gonorrhea, chlamydia, syphilis, trichomonas, herpes, HPV, and human immunodeficiency virus (HIV). Herpes, HIV, and HPV are viral illnesses that have no cure. They can result in disability, cancer, and death.  You should be screened for sexually transmitted illnesses (STIs) including gonorrhea and chlamydia if:  You are sexually active and are younger than 24 years.  You are older than 24 years and your health care provider tells you that you are at risk for this type of infection.  Your sexual activity has changed  since you were last screened and you are at an increased risk for chlamydia or gonorrhea. Ask your health care provider if you are at risk.  If you are at risk of being infected with HIV, it is recommended that you take a prescription medicine daily to prevent HIV infection. This is called preexposure prophylaxis (PrEP). You are considered at risk if:  You are sexually active and do not regularly use condoms or know the HIV status of your partner(s).  You take drugs by injection.  You are sexually active with a partner who has HIV.  Talk with your health care provider about whether you are at high risk of being infected with HIV. If   you choose to begin PrEP, you should first be tested for HIV. You should then be tested every 3 months for as long as you are taking PrEP.  Osteoporosis is a disease in which the bones lose minerals and strength with aging. This can result in serious bone fractures or breaks. The risk of osteoporosis can be identified using a bone density scan. Women ages 67 years and over and women at risk for fractures or osteoporosis should discuss screening with their health care providers. Ask your health care provider whether you should take a calcium supplement or vitamin D to reduce the rate of osteoporosis.  Menopause can be associated with physical symptoms and risks. Hormone replacement therapy is available to decrease symptoms and risks. You should talk to your health care provider about whether hormone replacement therapy is right for you.  Use sunscreen. Apply sunscreen liberally and repeatedly throughout the day. You should seek shade when your shadow is shorter than you. Protect yourself by wearing long sleeves, pants, a wide-brimmed hat, and sunglasses year round, whenever you are outdoors.  Once a month, do a whole body skin exam, using a mirror to look at the skin on your back. Tell your health care provider of new moles, moles that have irregular borders, moles that  are larger than a pencil eraser, or moles that have changed in shape or color.  Stay current with required vaccines (immunizations).  Influenza vaccine. All adults should be immunized every year.  Tetanus, diphtheria, and acellular pertussis (Td, Tdap) vaccine. Pregnant women should receive 1 dose of Tdap vaccine during each pregnancy. The dose should be obtained regardless of the length of time since the last dose. Immunization is preferred during the 27th-36th week of gestation. An adult who has not previously received Tdap or who does not know her vaccine status should receive 1 dose of Tdap. This initial dose should be followed by tetanus and diphtheria toxoids (Td) booster doses every 10 years. Adults with an unknown or incomplete history of completing a 3-dose immunization series with Td-containing vaccines should begin or complete a primary immunization series including a Tdap dose. Adults should receive a Td booster every 10 years.  Varicella vaccine. An adult without evidence of immunity to varicella should receive 2 doses or a second dose if she has previously received 1 dose. Pregnant females who do not have evidence of immunity should receive the first dose after pregnancy. This first dose should be obtained before leaving the health care facility. The second dose should be obtained 4-8 weeks after the first dose.  Human papillomavirus (HPV) vaccine. Females aged 13-26 years who have not received the vaccine previously should obtain the 3-dose series. The vaccine is not recommended for use in pregnant females. However, pregnancy testing is not needed before receiving a dose. If a female is found to be pregnant after receiving a dose, no treatment is needed. In that case, the remaining doses should be delayed until after the pregnancy. Immunization is recommended for any person with an immunocompromised condition through the age of 61 years if she did not get any or all doses earlier. During the  3-dose series, the second dose should be obtained 4-8 weeks after the first dose. The third dose should be obtained 24 weeks after the first dose and 16 weeks after the second dose.  Zoster vaccine. One dose is recommended for adults aged 30 years or older unless certain conditions are present.  Measles, mumps, and rubella (MMR) vaccine. Adults born  before 1957 generally are considered immune to measles and mumps. Adults born in 1957 or later should have 1 or more doses of MMR vaccine unless there is a contraindication to the vaccine or there is laboratory evidence of immunity to each of the three diseases. A routine second dose of MMR vaccine should be obtained at least 28 days after the first dose for students attending postsecondary schools, health care workers, or international travelers. People who received inactivated measles vaccine or an unknown type of measles vaccine during 1963-1967 should receive 2 doses of MMR vaccine. People who received inactivated mumps vaccine or an unknown type of mumps vaccine before 1979 and are at high risk for mumps infection should consider immunization with 2 doses of MMR vaccine. For females of childbearing age, rubella immunity should be determined. If there is no evidence of immunity, females who are not pregnant should be vaccinated. If there is no evidence of immunity, females who are pregnant should delay immunization until after pregnancy. Unvaccinated health care workers born before 1957 who lack laboratory evidence of measles, mumps, or rubella immunity or laboratory confirmation of disease should consider measles and mumps immunization with 2 doses of MMR vaccine or rubella immunization with 1 dose of MMR vaccine.  Pneumococcal 13-valent conjugate (PCV13) vaccine. When indicated, a person who is uncertain of his immunization history and has no record of immunization should receive the PCV13 vaccine. All adults 65 years of age and older should receive this  vaccine. An adult aged 19 years or older who has certain medical conditions and has not been previously immunized should receive 1 dose of PCV13 vaccine. This PCV13 should be followed with a dose of pneumococcal polysaccharide (PPSV23) vaccine. Adults who are at high risk for pneumococcal disease should obtain the PPSV23 vaccine at least 8 weeks after the dose of PCV13 vaccine. Adults older than 61 years of age who have normal immune system function should obtain the PPSV23 vaccine dose at least 1 year after the dose of PCV13 vaccine.  Pneumococcal polysaccharide (PPSV23) vaccine. When PCV13 is also indicated, PCV13 should be obtained first. All adults aged 65 years and older should be immunized. An adult younger than age 65 years who has certain medical conditions should be immunized. Any person who resides in a nursing home or long-term care facility should be immunized. An adult smoker should be immunized. People with an immunocompromised condition and certain other conditions should receive both PCV13 and PPSV23 vaccines. People with human immunodeficiency virus (HIV) infection should be immunized as soon as possible after diagnosis. Immunization during chemotherapy or radiation therapy should be avoided. Routine use of PPSV23 vaccine is not recommended for American Indians, [place] Natives, or people younger than 65 years unless there are medical conditions that require PPSV23 vaccine. When indicated, people who have unknown immunization and have no record of immunization should receive PPSV23 vaccine. One-time revaccination 5 years after the first dose of PPSV23 is recommended for people aged 19-64 years who have chronic kidney failure, nephrotic syndrome, asplenia, or immunocompromised conditions. People who received 1-2 doses of PPSV23 before age 65 years should receive another dose of PPSV23 vaccine at age 65 years or later if at least 5 years have passed since the previous dose. Doses of PPSV23 are not  needed for people immunized with PPSV23 at or after age 65 years.  Meningococcal vaccine. Adults with asplenia or persistent complement component deficiencies should receive 2 doses of quadrivalent meningococcal conjugate (MenACWY-D) vaccine. The doses should be obtained   at least 2 months apart. Microbiologists working with certain meningococcal bacteria, [place] recruits, people at risk during an outbreak, and people who travel to or live in countries with a high rate of meningitis should be immunized. A first-year college student up through age 34 years who is living in a residence hall should receive a dose if she did not receive a dose on or after her 16th birthday. Adults who have certain high-risk conditions should receive one or more doses of vaccine.  Hepatitis A vaccine. Adults who wish to be protected from this disease, have certain high-risk conditions, work with hepatitis A-infected animals, work in hepatitis A research labs, or travel to or work in countries with a high rate of hepatitis A should be immunized. Adults who were previously unvaccinated and who anticipate close contact with an international adoptee during the first 60 days after arrival in the [place] from a country with a high rate of hepatitis A should be immunized.  Hepatitis B vaccine. Adults who wish to be protected from this disease, have certain high-risk conditions, may be exposed to blood or other infectious body fluids, are household contacts or sex partners of hepatitis B positive people, are clients or workers in certain care facilities, or travel to or work in countries with a high rate of hepatitis B should be immunized.  Haemophilus influenzae type b (Hib) vaccine. A previously unvaccinated person with asplenia or sickle cell disease or having a scheduled splenectomy should receive 1 dose of Hib vaccine. Regardless of previous immunization, a recipient of a hematopoietic stem cell transplant should receive a  3-dose series 6-12 months after her successful transplant. Hib vaccine is not recommended for adults with HIV infection. Preventive Services / Frequency Ages 35 to 4 years  Blood pressure check.** / Every 3-5 years.  Lipid and cholesterol check.** / Every 5 years beginning at age 60.  Clinical breast exam.** / Every 3 years for women in their 71s and 10s.  BRCA-related cancer risk assessment.** / For women who have family members with a BRCA-related cancer (breast, ovarian, tubal, or peritoneal cancers).  Pap test.** / Every 2 years from ages 76 through 26. Every 3 years starting at age 61 through age 76 or 93 with a history of 3 consecutive normal Pap tests.  HPV screening.** / Every 3 years from ages 37 through ages 60 to 51 with a history of 3 consecutive normal Pap tests.  Hepatitis C blood test.** / For any individual with known risks for hepatitis C.  Skin self-exam. / Monthly.  Influenza vaccine. / Every year.  Tetanus, diphtheria, and acellular pertussis (Tdap, Td) vaccine.** / Consult your health care provider. Pregnant women should receive 1 dose of Tdap vaccine during each pregnancy. 1 dose of Td every 10 years.  Varicella vaccine.** / Consult your health care provider. Pregnant females who do not have evidence of immunity should receive the first dose after pregnancy.  HPV vaccine. / 3 doses over 6 months, if 93 and younger. The vaccine is not recommended for use in pregnant females. However, pregnancy testing is not needed before receiving a dose.  Measles, mumps, rubella (MMR) vaccine.** / You need at least 1 dose of MMR if you were born in 1957 or later. You may also need a 2nd dose. For females of childbearing age, rubella immunity should be determined. If there is no evidence of immunity, females who are not pregnant should be vaccinated. If there is no evidence of immunity, females who are  pregnant should delay immunization until after pregnancy.  Pneumococcal  13-valent conjugate (PCV13) vaccine.** / Consult your health care provider.  Pneumococcal polysaccharide (PPSV23) vaccine.** / 1 to 2 doses if you smoke cigarettes or if you have certain conditions.  Meningococcal vaccine.** / 1 dose if you are age 68 to 8 years and a Market researcher living in a residence hall, or have one of several medical conditions, you need to get vaccinated against meningococcal disease. You may also need additional booster doses.  Hepatitis A vaccine.** / Consult your health care provider.  Hepatitis B vaccine.** / Consult your health care provider.  Haemophilus influenzae type b (Hib) vaccine.** / Consult your health care provider. Ages 7 to 53 years  Blood pressure check.** / Every year.  Lipid and cholesterol check.** / Every 5 years beginning at age 25 years.  Lung cancer screening. / Every year if you are aged 11-80 years and have a 30-pack-year history of smoking and currently smoke or have quit within the past 15 years. Yearly screening is stopped once you have quit smoking for at least 15 years or develop a health problem that would prevent you from having lung cancer treatment.  Clinical breast exam.** / Every year after age 48 years.  BRCA-related cancer risk assessment.** / For women who have family members with a BRCA-related cancer (breast, ovarian, tubal, or peritoneal cancers).  Mammogram.** / Every year beginning at age 41 years and continuing for as long as you are in good health. Consult with your health care provider.  Pap test.** / Every 3 years starting at age 65 years through age 37 or 70 years with a history of 3 consecutive normal Pap tests.  HPV screening.** / Every 3 years from ages 72 years through ages 60 to 40 years with a history of 3 consecutive normal Pap tests.  Fecal occult blood test (FOBT) of stool. / Every year beginning at age 21 years and continuing until age 5 years. You may not need to do this test if you get  a colonoscopy every 10 years.  Flexible sigmoidoscopy or colonoscopy.** / Every 5 years for a flexible sigmoidoscopy or every 10 years for a colonoscopy beginning at age 35 years and continuing until age 48 years.  Hepatitis C blood test.** / For all people born from [birth year] through 1965 and any individual with known risks for hepatitis C.  Skin self-exam. / Monthly.  Influenza vaccine. / Every year.  Tetanus, diphtheria, and acellular pertussis (Tdap/Td) vaccine.** / Consult your health care provider. Pregnant women should receive 1 dose of Tdap vaccine during each pregnancy. 1 dose of Td every 10 years.  Varicella vaccine.** / Consult your health care provider. Pregnant females who do not have evidence of immunity should receive the first dose after pregnancy.  Zoster vaccine.** / 1 dose for adults aged 30 years or older.  Measles, mumps, rubella (MMR) vaccine.** / You need at least 1 dose of MMR if you were born in 1957 or later. You may also need a second dose. For females of childbearing age, rubella immunity should be determined. If there is no evidence of immunity, females who are not pregnant should be vaccinated. If there is no evidence of immunity, females who are pregnant should delay immunization until after pregnancy.  Pneumococcal 13-valent conjugate (PCV13) vaccine.** / Consult your health care provider.  Pneumococcal polysaccharide (PPSV23) vaccine.** / 1 to 2 doses if you smoke cigarettes or if you have certain conditions.  Meningococcal vaccine.** /  Consult your health care provider.  Hepatitis A vaccine.** / Consult your health care provider.  Hepatitis B vaccine.** / Consult your health care provider.  Haemophilus influenzae type b (Hib) vaccine.** / Consult your health care provider. Ages 64 years and over  Blood pressure check.** / Every year.  Lipid and cholesterol check.** / Every 5 years beginning at age 23 years.  Lung cancer screening. / Every year if you  are aged 16-80 years and have a 30-pack-year history of smoking and currently smoke or have quit within the past 15 years. Yearly screening is stopped once you have quit smoking for at least 15 years or develop a health problem that would prevent you from having lung cancer treatment.  Clinical breast exam.** / Every year after age 74 years.  BRCA-related cancer risk assessment.** / For women who have family members with a BRCA-related cancer (breast, ovarian, tubal, or peritoneal cancers).  Mammogram.** / Every year beginning at age 44 years and continuing for as long as you are in good health. Consult with your health care provider.  Pap test.** / Every 3 years starting at age 58 years through age 22 or 39 years with 3 consecutive normal Pap tests. Testing can be stopped between 65 and 70 years with 3 consecutive normal Pap tests and no abnormal Pap or HPV tests in the past 10 years.  HPV screening.** / Every 3 years from ages 64 years through ages 70 or 61 years with a history of 3 consecutive normal Pap tests. Testing can be stopped between 65 and 70 years with 3 consecutive normal Pap tests and no abnormal Pap or HPV tests in the past 10 years.  Fecal occult blood test (FOBT) of stool. / Every year beginning at age 40 years and continuing until age 27 years. You may not need to do this test if you get a colonoscopy every 10 years.  Flexible sigmoidoscopy or colonoscopy.** / Every 5 years for a flexible sigmoidoscopy or every 10 years for a colonoscopy beginning at age 7 years and continuing until age 32 years.  Hepatitis C blood test.** / For all people born from [birth year] through 1966 and any individual with known risks for hepatitis C.  Osteoporosis screening.** / A one-time screening for women ages 30 years and over and women at risk for fractures or osteoporosis.  Skin self-exam. / Monthly.  Influenza vaccine. / Every year.  Tetanus, diphtheria, and acellular pertussis (Tdap/Td)  vaccine.** / 1 dose of Td every 10 years.  Varicella vaccine.** / Consult your health care provider.  Zoster vaccine.** / 1 dose for adults aged 35 years or older.  Pneumococcal 13-valent conjugate (PCV13) vaccine.** / Consult your health care provider.  Pneumococcal polysaccharide (PPSV23) vaccine.** / 1 dose for all adults aged 46 years and older.  Meningococcal vaccine.** / Consult your health care provider.  Hepatitis A vaccine.** / Consult your health care provider.  Hepatitis B vaccine.** / Consult your health care provider.  Haemophilus influenzae type b (Hib) vaccine.** / Consult your health care provider. ** Family history and personal history of risk and conditions may change your health care provider's recommendations.   This information is not intended to replace advice given to you by your health care provider. Make sure you discuss any questions you have with your health care provider.   Document Released: 11/28/2001 Document Revised: 10/23/2014 Document Reviewed: 02/27/2011 Elsevier Interactive Patient Education [employer].

## 2016-07-14 NOTE — Assessment & Plan Note (Signed)
Well controlled. Encouraged heart healthy diet such as the DASH diet and exercise as tolerated.  

## 2016-07-14 NOTE — Assessment & Plan Note (Signed)
hgba1c acceptable, minimize simple carbs. Increase exercise as tolerated.  

## 2016-11-08 DIAGNOSIS — H5203 Hypermetropia, bilateral: Secondary | ICD-10-CM | POA: Diagnosis not present

## 2016-12-01 ENCOUNTER — Other Ambulatory Visit: Payer: Self-pay

## 2016-12-01 MED ORDER — OSELTAMIVIR PHOSPHATE 75 MG PO CAPS: 75.0000 mg | ORAL_CAPSULE | Freq: Two times a day (BID) | ORAL | 0 refills | Status: DC

## 2016-12-02 ENCOUNTER — Ambulatory Visit: Payer: 59 | Admitting: Family Medicine

## 2016-12-27 DIAGNOSIS — H9313 Tinnitus, bilateral: Secondary | ICD-10-CM | POA: Diagnosis not present

## 2016-12-27 DIAGNOSIS — H6121 Impacted cerumen, right ear: Secondary | ICD-10-CM | POA: Diagnosis not present

## 2016-12-27 DIAGNOSIS — H9042 Sensorineural hearing loss, unilateral, left ear, with unrestricted hearing on the contralateral side: Secondary | ICD-10-CM | POA: Diagnosis not present

## 2017-01-03 DIAGNOSIS — H903 Sensorineural hearing loss, bilateral: Secondary | ICD-10-CM | POA: Diagnosis not present

## 2017-01-22 DIAGNOSIS — R238 Other skin changes: Secondary | ICD-10-CM | POA: Diagnosis not present

## 2017-01-23 DIAGNOSIS — B0239 Other herpes zoster eye disease: Secondary | ICD-10-CM | POA: Diagnosis not present

## 2017-03-22 ENCOUNTER — Telehealth: Payer: Self-pay | Admitting: Gastroenterology

## 2017-03-30 ENCOUNTER — Encounter: Payer: Self-pay | Admitting: Gastroenterology

## 2017-03-30 NOTE — Telephone Encounter (Signed)
Dr. Nandigam reviewed records and has accepted patient. Ok to schedule Direct Colon. Left message for patient to return my call.  °

## 2017-04-04 ENCOUNTER — Telehealth: Payer: Self-pay | Admitting: Gastroenterology

## 2017-04-04 ENCOUNTER — Encounter: Payer: Self-pay | Admitting: Family Medicine

## 2017-04-04 NOTE — Telephone Encounter (Signed)
Rec'd from [facility] Latino forward 14 pages to Dr. Harl Bowie

## 2017-04-05 ENCOUNTER — Encounter: Payer: Self-pay | Admitting: Family Medicine

## 2017-04-25 DIAGNOSIS — Z1231 Encounter for screening mammogram for malignant neoplasm of breast: Secondary | ICD-10-CM | POA: Diagnosis not present

## 2017-04-25 LAB — HM DEXA SCAN

## 2017-04-25 LAB — HM MAMMOGRAPHY

## 2017-04-27 ENCOUNTER — Encounter: Payer: Self-pay | Admitting: Family Medicine

## 2017-05-14 ENCOUNTER — Ambulatory Visit (AMBULATORY_SURGERY_CENTER): Payer: Self-pay | Admitting: *Deleted

## 2017-05-14 VITALS — Ht 63.0 in | Wt 132.0 lb

## 2017-05-14 DIAGNOSIS — Z86010 Personal history of colonic polyps: Secondary | ICD-10-CM

## 2017-05-14 MED ORDER — NA SULFATE-K SULFATE-MG SULF 17.5-3.13-1.6 GM/177ML PO SOLN: ORAL | 0 refills | Status: DC

## 2017-05-14 NOTE — Progress Notes (Signed)
Patient denies any allergies to eggs or soy. Patient denies any problems with anesthesia/sedation. Patient denies any oxygen use at home and does not take any diet/weight loss medications. EMMI education assisgned to patient on colonoscopy, this was explained and instructions given to patient. 

## 2017-05-18 ENCOUNTER — Encounter: Payer: Self-pay | Admitting: Gastroenterology

## 2017-05-30 ENCOUNTER — Ambulatory Visit (AMBULATORY_SURGERY_CENTER): Payer: 59 | Admitting: Gastroenterology

## 2017-05-30 ENCOUNTER — Encounter: Payer: Self-pay | Admitting: Gastroenterology

## 2017-05-30 VITALS — BP 103/48 | HR 63 | Temp 98.4°F | Resp 20 | Ht 63.0 in | Wt 133.0 lb

## 2017-05-30 DIAGNOSIS — D120 Benign neoplasm of cecum: Secondary | ICD-10-CM

## 2017-05-30 DIAGNOSIS — Z1211 Encounter for screening for malignant neoplasm of colon: Secondary | ICD-10-CM | POA: Diagnosis not present

## 2017-05-30 DIAGNOSIS — Z86010 Personal history of colonic polyps: Secondary | ICD-10-CM

## 2017-05-30 MED ORDER — SODIUM CHLORIDE 0.9 % IV SOLN: 500.0000 mL | INTRAVENOUS | Status: DC

## 2017-05-30 NOTE — Op Note (Signed)
[facility] Patient Name: Yolanda Hernandez Procedure Date: 05/30/2017 9:13 AM MRN: [id number] Endoscopist: Mauri Pole , MD Age: 61 Referring MD:  Date of Birth: 1956/07/22 Gender: Female Account #: [id number] Procedure:                Colonoscopy Indications:              High risk colon cancer surveillance: Personal                            history of colonic polyps, Last colonoscopy: 2012 Medicines:                Monitored Anesthesia Care Procedure:                Pre-Anesthesia Assessment:                           - Prior to the procedure, a History and Physical                            was performed, and patient medications and                            allergies were reviewed. The patient's tolerance of                            previous anesthesia was also reviewed. The risks                            and benefits of the procedure and the sedation                            options and risks were discussed with the patient.                            All questions were answered, and informed consent                            was obtained. Prior Anticoagulants: The patient has                            taken no previous anticoagulant or antiplatelet                            agents. ASA Grade Assessment: II - A patient with                            mild systemic disease. After reviewing the risks                            and benefits, the patient was deemed in                            satisfactory condition to undergo the procedure.  After obtaining informed consent, the colonoscope                            was passed under direct vision. Throughout the                            procedure, the patient's blood pressure, pulse, and                            oxygen saturations were monitored continuously. The                            Colonoscope was introduced through the anus and                            advanced to  the the cecum, identified by                            appendiceal orifice and ileocecal valve. The                            colonoscopy was performed without difficulty. The                            patient tolerated the procedure well. The quality                            of the bowel preparation was excellent. The                            ileocecal valve, appendiceal orifice, and rectum                            were photographed. Scope In: 9:21:44 AM Scope Out: 9:35:11 AM Scope Withdrawal Time: 0 hours 9 minutes 34 seconds  Total Procedure Duration: 0 hours 13 minutes 27 seconds  Findings:                 The perianal and digital rectal examinations were                            normal.                           A 8 mm polyp was found in the cecum. The polyp was                            sessile. The polyp was removed with a cold snare.                            Resection and retrieval were complete.                           Non-bleeding internal hemorrhoids were found during  retroflexion. The hemorrhoids were small.                           The exam was otherwise without abnormality. Complications:            No immediate complications. Estimated Blood Loss:     Estimated blood loss was minimal. Impression:               - One 8 mm polyp in the cecum, removed with a cold                            snare. Resected and retrieved.                           - Non-bleeding internal hemorrhoids.                           - The examination was otherwise normal. Recommendation:           - Patient has a contact number available for                            emergencies. The signs and symptoms of potential                            delayed complications were discussed with the                            patient. Return to normal activities tomorrow.                            Written discharge instructions were provided to the                             patient.                           - Resume previous diet.                           - Continue present medications.                           - Await pathology results.                           - Repeat colonoscopy in 5 years for surveillance                            based on pathology results.                           - Return to GI clinic PRN. Mauri Pole, MD 05/30/2017 9:38:15 AM This report has been signed electronically.

## 2017-05-30 NOTE — Progress Notes (Signed)
A and O x3. Report to RN. Tolerated MAC anesthesia well.

## 2017-05-30 NOTE — Progress Notes (Signed)
Called to room to assist during endoscopic procedure.  Patient ID and intended procedure confirmed with present staff. Received instructions for my participation in the procedure from the performing physician.  

## 2017-05-30 NOTE — Patient Instructions (Signed)
YOU HAD AN ENDOSCOPIC PROCEDURE TODAY AT [facility]:   Refer to the procedure report that was given to you for any specific questions about what was found during the examination.  If the procedure report does not answer your questions, please call your gastroenterologist to clarify.  If you requested that your care partner not be given the details of your procedure findings, then the procedure report has been included in a sealed envelope for you to review at your convenience later.  YOU SHOULD EXPECT: Some feelings of bloating in the abdomen. Passage of more gas than usual.  Walking can help get rid of the air that was put into your GI tract during the procedure and reduce the bloating. If you had a lower endoscopy (such as a colonoscopy or flexible sigmoidoscopy) you may notice spotting of blood in your stool or on the toilet paper. If you underwent a bowel prep for your procedure, you may not have a normal bowel movement for a few days.  Please Note:  You might notice some irritation and congestion in your nose or some drainage.  This is from the oxygen used during your procedure.  There is no need for concern and it should clear up in a day or so.  SYMPTOMS TO REPORT IMMEDIATELY:   Following lower endoscopy (colonoscopy or flexible sigmoidoscopy):  Excessive amounts of blood in the stool  Significant tenderness or worsening of abdominal pains  Swelling of the abdomen that is new, acute  Fever of 100F or higher  For urgent or emergent issues, a gastroenterologist can be reached at any hour by calling [phone].   DIET:  We do recommend a small meal at first, but then you may proceed to your regular diet.  Drink plenty of fluids but you should avoid alcoholic beverages for 24 hours.  ACTIVITY:  You should plan to take it easy for the rest of today and you should NOT DRIVE or use heavy machinery until tomorrow (because of the sedation medicines used during the test).     FOLLOW UP: Our staff will call the number listed on your records the next business day following your procedure to check on you and address any questions or concerns that you may have regarding the information given to you following your procedure. If we do not reach you, we will leave a message.  However, if you are feeling well and you are not experiencing any problems, there is no need to return our call.  We will assume that you have returned to your regular daily activities without incident.  If any biopsies were taken you will be contacted by phone or by letter within the next 1-3 weeks.  Please call us at [phone] if you have not heard about the biopsies in 3 weeks.   Await for biopsy results to determine next repeat Colonoscopy screening Polyp (handout given) Hemorrhoid (handout given)   SIGNATURES/CONFIDENTIALITY: You and/or your care partner have signed paperwork which will be entered into your electronic medical record.  These signatures attest to the fact that that the information above on your After Visit Summary has been reviewed and is understood.  Full responsibility of the confidentiality of this discharge information lies with you and/or your care-partner.

## 2017-05-31 ENCOUNTER — Telehealth: Payer: Self-pay | Admitting: *Deleted

## 2017-05-31 NOTE — Telephone Encounter (Signed)
  Follow up Call-  Call back number 05/30/2017  Post procedure Call Back phone  # [phone]  Permission to leave phone message Yes  Some recent data might be hidden     Patient questions:  Do you have a fever, pain , or abdominal swelling? No. Pain Score  0 *  Have you tolerated food without any problems? Yes.    Have you been able to return to your normal activities? Yes.    Do you have any questions about your discharge instructions: Diet   No. Medications  No. Follow up visit  No.  Do you have questions or concerns about your Care? No.  Actions: * If pain score is 4 or above: No action needed, pain <4.

## 2017-06-04 ENCOUNTER — Encounter: Payer: Self-pay | Admitting: Gastroenterology

## 2017-07-31 ENCOUNTER — Ambulatory Visit (INDEPENDENT_AMBULATORY_CARE_PROVIDER_SITE_OTHER): Payer: 59 | Admitting: Family Medicine

## 2017-07-31 ENCOUNTER — Encounter: Payer: Self-pay | Admitting: Family Medicine

## 2017-07-31 VITALS — BP 118/68 | HR 87 | Temp 98.3°F | Resp 18 | Ht 63.0 in | Wt 131.0 lb

## 2017-07-31 DIAGNOSIS — R739 Hyperglycemia, unspecified: Secondary | ICD-10-CM

## 2017-07-31 DIAGNOSIS — M810 Age-related osteoporosis without current pathological fracture: Secondary | ICD-10-CM | POA: Diagnosis not present

## 2017-07-31 DIAGNOSIS — R319 Hematuria, unspecified: Secondary | ICD-10-CM | POA: Diagnosis not present

## 2017-07-31 DIAGNOSIS — Z23 Encounter for immunization: Secondary | ICD-10-CM

## 2017-07-31 DIAGNOSIS — E782 Mixed hyperlipidemia: Secondary | ICD-10-CM

## 2017-07-31 DIAGNOSIS — Z0000 Encounter for general adult medical examination without abnormal findings: Secondary | ICD-10-CM

## 2017-07-31 HISTORY — DX: Hematuria, unspecified: R31.9

## 2017-07-31 LAB — COMPREHENSIVE METABOLIC PANEL
ALK PHOS: 46 U/L (ref 39–117)
ALT: 14 U/L (ref 0–35)
AST: 17 U/L (ref 0–37)
Albumin: 4.4 g/dL (ref 3.5–5.2)
BILIRUBIN TOTAL: 0.5 mg/dL (ref 0.2–1.2)
BUN: 16 mg/dL (ref 6–23)
CO2: 31 mEq/L (ref 19–32)
CREATININE: 0.73 mg/dL (ref 0.40–1.20)
Calcium: 10 mg/dL (ref 8.4–10.5)
Chloride: 103 mEq/L (ref 96–112)
GFR: 85.64 mL/min (ref >60.00)
GLUCOSE: 96 mg/dL (ref 70–99)
Potassium: 4 mEq/L (ref 3.5–5.1)
Sodium: 140 mEq/L (ref 135–145)
TOTAL PROTEIN: 7 g/dL (ref 6.0–8.3)

## 2017-07-31 LAB — CBC
HCT: 45.7 % (ref 36.0–46.0)
HEMOGLOBIN: 15.4 g/dL — AB (ref 12.0–15.0)
MCHC: 33.8 g/dL (ref 30.0–36.0)
MCV: 94.2 fl (ref 78.0–100.0)
PLATELETS: 361 10*3/uL (ref 150.0–400.0)
RBC: 4.85 Mil/uL (ref 3.87–5.11)
RDW: 12.4 % (ref 11.5–15.5)
WBC: 5.8 10*3/uL (ref 4.0–10.5)

## 2017-07-31 LAB — URINALYSIS
BILIRUBIN URINE: NEGATIVE
KETONES UR: NEGATIVE
Leukocytes, UA: NEGATIVE
Nitrite: NEGATIVE
Specific Gravity, Urine: 1.01 (ref 1.000–1.030)
TOTAL PROTEIN, URINE-UPE24: NEGATIVE
URINE GLUCOSE: NEGATIVE
UROBILINOGEN UA: 0.2 (ref 0.0–1.0)
pH: 6 (ref 5.0–8.0)

## 2017-07-31 LAB — LIPID PANEL
CHOL/HDL RATIO: 4
Cholesterol: 212 mg/dL — ABNORMAL HIGH (ref 0–200)
HDL: 57.3 mg/dL (ref >39.00)
LDL Cholesterol: 132 mg/dL — ABNORMAL HIGH (ref 0–99)
NONHDL: 154.85
Triglycerides: 113 mg/dL (ref 0.0–149.0)
VLDL: 22.6 mg/dL (ref 0.0–40.0)

## 2017-07-31 LAB — VITAMIN D 25 HYDROXY (VIT D DEFICIENCY, FRACTURES): VITD: 45.88 ng/mL (ref 30.00–100.00)

## 2017-07-31 LAB — TSH: TSH: 2.92 u[IU]/mL (ref 0.35–4.50)

## 2017-07-31 LAB — HEMOGLOBIN A1C: Hgb A1c MFr Bld: 5.6 % (ref 4.6–6.5)

## 2017-07-31 MED ORDER — ALENDRONATE SODIUM 70 MG PO TABS: 70.0000 mg | ORAL_TABLET | ORAL | 1 refills | Status: DC

## 2017-07-31 NOTE — Assessment & Plan Note (Signed)
Distant history with work up with urology in past. Will proceed with ua and c and s today

## 2017-07-31 NOTE — Assessment & Plan Note (Signed)
hgba1c acceptable, minimize simple carbs. Increase exercise as tolerated.  

## 2017-07-31 NOTE — Assessment & Plan Note (Signed)
Patient encouraged to maintain heart healthy diet, regular exercise, adequate sleep. Consider daily probiotics. Take medications as prescribed 

## 2017-07-31 NOTE — Addendum Note (Signed)
Addended by: Magdalene Molly A on: 07/31/2017 01:47 PM   Modules accepted: Orders

## 2017-07-31 NOTE — Patient Instructions (Addendum)
Shingrix is the new shingles shot, 2 shots over 2-6 months. At [facility] at [email or web address],  Preventive Care 40-64 Years, Female Preventive care refers to lifestyle choices and visits with your health care provider that can promote health and wellness. What does preventive care include?  A yearly physical exam. This is also called an annual well check.  Dental exams once or twice a year.  Routine eye exams. Ask your health care provider how often you should have your eyes checked.  Personal lifestyle choices, including: ? Daily care of your teeth and gums. ? Regular physical activity. ? Eating a healthy diet. ? Avoiding tobacco and drug use. ? Limiting alcohol use. ? Practicing safe sex. ? Taking low-dose aspirin daily starting at age 68. ? Taking vitamin and mineral supplements as recommended by your health care provider. What happens during an annual well check? The services and screenings done by your health care provider during your annual well check will depend on your age, overall health, lifestyle risk factors, and family history of disease. Counseling Your health care provider may ask you questions about your:  Alcohol use.  Tobacco use.  Drug use.  Emotional well-being.  Home and relationship well-being.  Sexual activity.  Eating habits.  Work and work Statistician.  Method of birth control.  Menstrual cycle.  Pregnancy history.  Screening You may have the following tests or measurements:  Height, weight, and BMI.  Blood pressure.  Lipid and cholesterol levels. These may be checked every 5 years, or more frequently if you are over 64 years old.  Skin check.  Lung cancer screening. You may have this screening every year starting at age 41 if you have a 30-pack-year history of smoking and currently smoke or have quit within the past 15 years.  Fecal occult blood test (FOBT) of the stool. You may have this test every year starting at  age 62.  Flexible sigmoidoscopy or colonoscopy. You may have a sigmoidoscopy every 5 years or a colonoscopy every 10 years starting at age 44.  Hepatitis C blood test.  Hepatitis B blood test.  Sexually transmitted disease (STD) testing.  Diabetes screening. This is done by checking your blood sugar (glucose) after you have not eaten for a while (fasting). You may have this done every 1-3 years.  Mammogram. This may be done every 1-2 years. Talk to your health care provider about when you should start having regular mammograms. This may depend on whether you have a family history of breast cancer.  BRCA-related cancer screening. This may be done if you have a family history of breast, ovarian, tubal, or peritoneal cancers.  Pelvic exam and Pap test. This may be done every 3 years starting at age 24. Starting at age 97, this may be done every 5 years if you have a Pap test in combination with an HPV test.  Bone density scan. This is done to screen for osteoporosis. You may have this scan if you are at high risk for osteoporosis.  Discuss your test results, treatment options, and if necessary, the need for more tests with your health care provider. Vaccines Your health care provider may recommend certain vaccines, such as:  Influenza vaccine. This is recommended every year.  Tetanus, diphtheria, and acellular pertussis (Tdap, Td) vaccine. You may need a Td booster every 10 years.  Varicella vaccine. You may need this if you have not been vaccinated.  Zoster vaccine. You may need this after age 33.  Measles, mumps,  and rubella (MMR) vaccine. You may need at least one dose of MMR if you were born in 1957 or later. You may also need a second dose.  Pneumococcal 13-valent conjugate (PCV13) vaccine. You may need this if you have certain conditions and were not previously vaccinated.  Pneumococcal polysaccharide (PPSV23) vaccine. You may need one or two doses if you smoke cigarettes or if  you have certain conditions.  Meningococcal vaccine. You may need this if you have certain conditions.  Hepatitis A vaccine. You may need this if you have certain conditions or if you travel or work in places where you may be exposed to hepatitis A.  Hepatitis B vaccine. You may need this if you have certain conditions or if you travel or work in places where you may be exposed to hepatitis B.  Haemophilus influenzae type b (Hib) vaccine. You may need this if you have certain conditions.  Talk to your health care provider about which screenings and vaccines you need and how often you need them. This information is not intended to replace advice given to you by your health care provider. Make sure you discuss any questions you have with your health care provider. Document Released: 10/29/2015 Document Revised: 06/21/2016 Document Reviewed: 08/03/2015 Elsevier Interactive Patient Education  2017 [employer] Recommend calcium intake of 1200 to 1500 mg daily, divided into roughly 3 doses. Best source is the diet and a single dairy serving is about 500 mg, a supplement of calcium citrate once or twice daily to balance diet is fine if not getting enough in diet. Also need Vitamin D 2000 IU caps, 1 cap daily if not already taking vitamin D. Also recommend weight baring exercise on hips and upper body to keep bones strong 

## 2017-07-31 NOTE — Assessment & Plan Note (Signed)
Encouraged heart healthy diet, increase exercise, avoid trans fats, consider a krill oil cap daily 

## 2017-07-31 NOTE — Assessment & Plan Note (Addendum)
Recommend calcium intake of 1200 to 1500 mg daily, divided into roughly 3 doses. Best source is the diet and a single dairy serving is about 500 mg, a supplement of calcium citrate once or twice daily to balance diet is fine if not getting enough in diet. Also need Vitamin D 2000 IU caps, 1 cap daily if not already taking vitamin D. Also recommend weight baring exercise on hips and upper body to keep bones strong. Will proceed Fosamax 70 mg weekly and see if tolerated. Is going to try Osteostrong.

## 2017-07-31 NOTE — Progress Notes (Signed)
Subjective:  I acted as a Education administrator for Dr. Charlett Blake. [place], [place]  Patient ID: Yolanda Hernandez, female    DOB: Jul 31, 1956, 61 y.o.   MRN: [id number]  No chief complaint on file.   HPI  Patient is in today for an annual exam. She is doing well. No recent febrile illness or acute hospitalizations. Is taking 2 chewable calciums with Vitamin D daily, exercises regularly And tries to maintain a heart healthy diet. She is still drinking couple of diet sodas weekly and is questioning whether she should continue to do that advice is given to discontinue. No recent febrile illness or acute concerns.Denies CP/palp/SOB/HA/congestion/fevers/GI or GU c/o. Taking meds as prescribed  Patient Care Team: Mosie Lukes, MD as PCP - General (Family Medicine)   Past Medical History:  Diagnosis Date  . Cerumen impaction    chronic  . Cervical cancer screening 03/12/2015   Menarche at 10 Regular and moderate flow  history of abnormal pap in past, bx and cryo once many years ago, normal since. Last 2011 and normal G0P0, s/p No history of abnormal MGM Last in 2015, no breast concerns  No concerns today Biopsy, cervical: gyn surgeries Menopause at 43 ish  . Hematuria 07/31/2017  . Hyperglycemia 03/19/2013  . Hypertension   . Osteopenia   . Osteoporosis 04/11/2010   Qualifier: Diagnosis of  By: Wynona Luna   . Other and unspecified hyperlipidemia 03/19/2013  . Skin cancer   . Skin cancer 07/14/2016  . Uterine fibroid     Past Surgical History:  Procedure Laterality Date  . BREAST SURGERY     biopsy left  . MYRINGOPLASTY  1972  . TONSILLECTOMY      Family History  Problem Relation Age of Onset  . Osteoporosis Mother   . Hyperlipidemia Mother   . Hypertension Mother   . Heart disease Father   . Diabetes Father   . Hyperlipidemia Father   . Hypertension Father   . Diabetes Sister   . Heart disease Sister        s/p cardiac stent  . Hyperlipidemia Sister   . Hypertension Sister   . Kidney  disease Other   . Alcohol abuse Other   . Colon cancer Neg Hx     Social History   Social History  . Marital status: Married    Spouse name: N/A  . Number of children: N/A  . Years of education: N/A   Occupational History  . Not on file.   Social History Main Topics  . Smoking status: Never Smoker  . Smokeless tobacco: Never Used  . Alcohol use Yes     Comment: 1 glass Wine every other week per pt.  . Drug use: No  . Sexual activity: Yes     Comment: lives with husband, works at [employer], no dietary restricitons, exercises with strength training, elliptical etc   Other Topics Concern  . Not on file   Social History Narrative  . No narrative on file    Outpatient Medications Prior to Visit  Medication Sig Dispense Refill  . Calcium-Vitamin D-Vitamin K (CALCIUM SOFT CHEWS PO) Take 1 each by mouth daily.    Marland Kitchen 0.9 %  sodium chloride infusion      No facility-administered medications prior to visit.     Allergies  Allergen Reactions  . Iodine Rash  . Sulfa Antibiotics Rash    Review of Systems  Constitutional: Negative for fever and malaise/fatigue.  HENT: Negative  for congestion.   Eyes: Negative for blurred vision.  Respiratory: Negative for cough and shortness of breath.   Cardiovascular: Negative for chest pain, palpitations and leg swelling.  Gastrointestinal: Negative for vomiting.  Musculoskeletal: Negative for back pain.  Skin: Negative for rash.  Neurological: Negative for loss of consciousness and headaches.       Objective:    Physical Exam  Constitutional: She is oriented to person, place, and time. She appears well-developed and well-nourished. No distress.  HENT:  Head: Normocephalic and atraumatic.  Eyes: Conjunctivae are normal.  Neck: Normal range of motion. No thyromegaly present.  Cardiovascular: Normal rate and regular rhythm.   Pulmonary/Chest: Effort normal and breath sounds normal. She has no wheezes.  Abdominal: Soft.  Bowel sounds are normal. There is no tenderness.  Musculoskeletal: Normal range of motion. She exhibits no edema or deformity.  Neurological: She is alert and oriented to person, place, and time.  Skin: Skin is warm and dry. She is not diaphoretic.  Psychiatric: She has a normal mood and affect.    BP 118/68 (BP Location: Left Arm, Patient Position: Sitting, Cuff Size: Normal)   Pulse 87   Temp 98.3 F (36.8 C) (Oral)   Resp 18   Ht 5\' 3"  (1.6 m)   Wt 131 lb (59.4 kg)   SpO2 98%   BMI 23.21 kg/m  Wt Readings from Last 3 Encounters:  07/31/17 131 lb (59.4 kg)  05/30/17 133 lb (60.3 kg)  05/14/17 132 lb (59.9 kg)   BP Readings from Last 3 Encounters:  07/31/17 118/68  05/30/17 (!) 103/48  07/14/16 (!) 142/72     Immunization History  Administered Date(s) Administered  . Influenza,inj,Quad PF,6+ Mos 07/07/2013, 07/09/2015, 07/14/2016, 07/31/2017  . Pneumococcal Conjugate-13 03/12/2015  . Pneumococcal Polysaccharide-23 07/14/2016  . Td 10/14/2008  . Zoster 10/23/2014    Health Maintenance  Topic Date Due  . HIV Screening  10/19/1969  . INFLUENZA VACCINE  05/16/2017  . PAP SMEAR  03/11/2018  . TETANUS/TDAP  10/14/2018  . MAMMOGRAM  04/26/2019  . COLONOSCOPY  05/30/2022  . Hepatitis C Screening  Completed    Lab Results  Component Value Date   WBC 4.8 07/14/2016   HGB 15.2 (H) 07/14/2016   HCT 44.3 07/14/2016   PLT 339.0 07/14/2016   GLUCOSE 91 07/14/2016   CHOL 209 (H) 07/14/2016   TRIG 164.0 (H) 07/14/2016   HDL 51.60 07/14/2016   LDLCALC 125 (H) 07/14/2016   ALT 11 07/14/2016   AST 16 07/14/2016   NA 141 07/14/2016   K 4.1 07/14/2016   CL 105 07/14/2016   CREATININE 0.72 07/14/2016   BUN 15 07/14/2016   CO2 33 (H) 07/14/2016   TSH 2.28 07/14/2016   HGBA1C 5.6 07/14/2016    Lab Results  Component Value Date   TSH 2.28 07/14/2016   Lab Results  Component Value Date   WBC 4.8 07/14/2016   HGB 15.2 (H) 07/14/2016   HCT 44.3 07/14/2016   MCV 92.0  07/14/2016   PLT 339.0 07/14/2016   Lab Results  Component Value Date   NA 141 07/14/2016   K 4.1 07/14/2016   CO2 33 (H) 07/14/2016   GLUCOSE 91 07/14/2016   BUN 15 07/14/2016   CREATININE 0.72 07/14/2016   BILITOT 0.6 07/14/2016   ALKPHOS 45 07/14/2016   AST 16 07/14/2016   ALT 11 07/14/2016   PROT 6.6 07/14/2016   ALBUMIN 4.1 07/14/2016   CALCIUM 9.3 07/14/2016   GFR 87.31  07/14/2016   Lab Results  Component Value Date   CHOL 209 (H) 07/14/2016   Lab Results  Component Value Date   HDL 51.60 07/14/2016   Lab Results  Component Value Date   LDLCALC 125 (H) 07/14/2016   Lab Results  Component Value Date   TRIG 164.0 (H) 07/14/2016   Lab Results  Component Value Date   CHOLHDL 4 07/14/2016   Lab Results  Component Value Date   HGBA1C 5.6 07/14/2016         Assessment & Plan:   Problem List Items Addressed This Visit    Osteoporosis    Recommend calcium intake of 1200 to 1500 mg daily, divided into roughly 3 doses. Best source is the diet and a single dairy serving is about 500 mg, a supplement of calcium citrate once or twice daily to balance diet is fine if not getting enough in diet. Also need Vitamin D 2000 IU caps, 1 cap daily if not already taking vitamin D. Also recommend weight baring exercise on hips and upper body to keep bones strong. Will proceed Fosamax 70 mg weekly and see if tolerated. Is going to try Osteostrong.      Relevant Medications   alendronate (FOSAMAX) 70 MG tablet   Other Relevant Orders   Comprehensive metabolic panel   VITAMIN D 25 Hydroxy (Vit-D Deficiency, Fractures)   Preventative health care    Patient encouraged to maintain heart healthy diet, regular exercise, adequate sleep. Consider daily probiotics. Take medications as prescribed      Relevant Orders   CBC   TSH   Hyperlipidemia, mixed    Encouraged heart healthy diet, increase exercise, avoid trans fats, consider a krill oil cap daily.      Relevant Orders    Lipid panel   Hyperglycemia    hgba1c acceptable, minimize simple carbs. Increase exercise as tolerated.       Relevant Orders   Hemoglobin A1c   Hematuria    Distant history with work up with urology in past. Will proceed with ua and c and s today       Other Visit Diagnoses    Needs flu shot    -  Primary   Relevant Orders   Flu Vaccine QUAD 6+ mos PF IM (Fluarix Quad PF) (Completed)      I am having Ms. Corsello start on alendronate. I am also having her maintain her Calcium-Vitamin D-Vitamin K (CALCIUM SOFT CHEWS PO). We will stop administering sodium chloride.  Meds ordered this encounter  Medications  . alendronate (FOSAMAX) 70 MG tablet    Sig: Take 1 tablet (70 mg total) by mouth once a week. Take with a full glass of water on an empty stomach.    Dispense:  4 tablet    Refill:  1    CMA served as scribe during this visit. History, Physical and Plan performed by medical provider. Documentation and orders reviewed and attested to.  Penni Homans, MD

## 2017-08-01 LAB — URINE CULTURE
MICRO NUMBER:: 81153092
SPECIMEN QUALITY: ADEQUATE

## 2017-09-12 ENCOUNTER — Encounter: Payer: Self-pay | Admitting: Family Medicine

## 2017-10-02 NOTE — Progress Notes (Unsigned)
Patient is calling back in regards to hearing back from the doctor. please contact patient and discuss. [phone]

## 2017-10-12 ENCOUNTER — Telehealth: Payer: Self-pay | Admitting: Family Medicine

## 2017-10-12 NOTE — Telephone Encounter (Signed)
Copied from [place]. Topic: General - Other >> Oct 12, 2017  3:54 PM Neva Seat wrote: Pt is still waiting on Prolia approval since she could not tolerate the Fosamax previously prescribed.  Pt has been waiting since Nov for this.  She has not been taking the Fosamax since she feels nauseous when taking it.   >> Oct 12, 2017  4:03 PM Neva Seat wrote: Pt is requesting this be resolved soon since she will going out of town Jan. 9th.

## 2017-10-12 NOTE — Telephone Encounter (Signed)
Copied from [place]. Topic: General - Other >> Oct 12, 2017  3:54 PM Neva Seat wrote: Pt is still waiting on Prolia approval since she could not tolerate the Fosamax previously prescribed.  Pt has been waiting since Nov for this.  She has not been taking the Fosamax since she feels nauseous when taking it.

## 2017-10-15 NOTE — Telephone Encounter (Signed)
Process has started

## 2017-10-16 DIAGNOSIS — B977 Papillomavirus as the cause of diseases classified elsewhere: Secondary | ICD-10-CM | POA: Insufficient documentation

## 2017-10-16 HISTORY — DX: Papillomavirus as the cause of diseases classified elsewhere: B97.7

## 2017-10-22 ENCOUNTER — Encounter: Payer: Self-pay | Admitting: Family Medicine

## 2017-10-22 ENCOUNTER — Telehealth: Payer: Self-pay | Admitting: Family Medicine

## 2017-10-22 NOTE — Telephone Encounter (Signed)
Pt would like a call with an update on what is going on with the Prolia approval. She will be going out of town this Wednesday for a month and was hoping this would be resolved by then. Has the insurance company been contacted. Please call pt asap.

## 2017-10-22 NOTE — Telephone Encounter (Signed)
Can you star the process for Prolia

## 2017-11-07 NOTE — Telephone Encounter (Signed)
Prolia benefits received PA not required 100% covered without an OV $20 copay with OV    Patient may owe approximately $0 OOP   Letter mailed to inform patient of benefits and to schedule

## 2017-11-08 NOTE — Telephone Encounter (Signed)
Can you order prolia for patient  thanks

## 2017-11-21 ENCOUNTER — Ambulatory Visit (INDEPENDENT_AMBULATORY_CARE_PROVIDER_SITE_OTHER): Payer: 59

## 2017-11-21 DIAGNOSIS — M810 Age-related osteoporosis without current pathological fracture: Secondary | ICD-10-CM

## 2017-11-21 MED ORDER — DENOSUMAB 60 MG/ML ~~LOC~~ SOLN
60.0000 mg | Freq: Once | SUBCUTANEOUS | Status: AC
  Administered 2017-11-21: 60 mg via SUBCUTANEOUS

## 2017-11-21 NOTE — Progress Notes (Signed)
Pt came in today to receive her Prolia injection. Pt was given injection in arm L SubQ at 1555 and left office at [id number] without complication. Pt inquired about side effects other Pt's may have reported and I told her that I personally had not heard anything as of yet. Pt inquired about getting shingles vaccine and I informed her that at this time we do not have any on hand except for Pt's receiving second doses. She stated that she'd go to her local pharmacy to see if they would be able to give her the vaccine.

## 2017-12-11 DIAGNOSIS — Z23 Encounter for immunization: Secondary | ICD-10-CM | POA: Diagnosis not present

## 2018-01-14 DIAGNOSIS — H6121 Impacted cerumen, right ear: Secondary | ICD-10-CM | POA: Diagnosis not present

## 2018-02-12 DIAGNOSIS — Z23 Encounter for immunization: Secondary | ICD-10-CM | POA: Diagnosis not present

## 2018-05-15 DIAGNOSIS — Z1231 Encounter for screening mammogram for malignant neoplasm of breast: Secondary | ICD-10-CM | POA: Diagnosis not present

## 2018-05-15 LAB — HM MAMMOGRAPHY

## 2018-05-23 ENCOUNTER — Encounter: Payer: Self-pay | Admitting: Family Medicine

## 2018-05-27 ENCOUNTER — Encounter: Payer: Self-pay | Admitting: Family Medicine

## 2018-05-27 ENCOUNTER — Telehealth: Payer: Self-pay | Admitting: Family Medicine

## 2018-05-27 NOTE — Telephone Encounter (Signed)
Yolanda Hernandez can we get her scheduled for next week for prolia injection  Need to order.

## 2018-05-27 NOTE — Telephone Encounter (Signed)
Pt scheduled for 06/04/18 @ 2:30pm

## 2018-05-27 NOTE — Telephone Encounter (Signed)
Copied from [place] [id number]. Topic: Quick Communication - See Telephone Encounter >> May 27, 2018 12:21 PM Blase Mess A wrote: CRM for notification. See Telephone encounter for: 05/27/18. Patient called wanting to schedule appt with nurse for prolia inj.  Please call to schedule please. Call back [phone] (M)

## 2018-05-29 ENCOUNTER — Ambulatory Visit: Payer: 59 | Admitting: Medical

## 2018-06-04 ENCOUNTER — Ambulatory Visit (INDEPENDENT_AMBULATORY_CARE_PROVIDER_SITE_OTHER): Payer: 59

## 2018-06-04 DIAGNOSIS — M810 Age-related osteoporosis without current pathological fracture: Secondary | ICD-10-CM | POA: Diagnosis not present

## 2018-06-04 MED ORDER — DENOSUMAB 60 MG/ML ~~LOC~~ SOSY
60.0000 mg | PREFILLED_SYRINGE | Freq: Once | SUBCUTANEOUS | Status: AC
  Administered 2018-06-04: 60 mg via SUBCUTANEOUS

## 2018-06-04 NOTE — Progress Notes (Signed)
Pre visit review using our clinic tool,if applicable. No additional management support is needed unless otherwise documented below in the visit note.   Patient in for Prolia injection. Given 60 mg IM left arm SQ. Patient tolerated well.  No complaints voiced this visit.  Reminder card given for next immunization.

## 2018-06-14 ENCOUNTER — Ambulatory Visit: Payer: 59

## 2018-07-08 ENCOUNTER — Ambulatory Visit (INDEPENDENT_AMBULATORY_CARE_PROVIDER_SITE_OTHER): Payer: 59

## 2018-07-08 DIAGNOSIS — Z23 Encounter for immunization: Secondary | ICD-10-CM

## 2018-08-12 ENCOUNTER — Encounter: Payer: 59 | Admitting: Family Medicine

## 2018-08-22 ENCOUNTER — Encounter: Payer: Self-pay | Admitting: Family Medicine

## 2018-08-22 ENCOUNTER — Telehealth: Payer: Self-pay

## 2018-08-22 ENCOUNTER — Telehealth: Payer: Self-pay | Admitting: Family Medicine

## 2018-08-22 ENCOUNTER — Other Ambulatory Visit (HOSPITAL_COMMUNITY)
Admission: RE | Admit: 2018-08-22 | Discharge: 2018-08-22 | Disposition: A | Discharge status: Home / Self Care | Payer: 59 | Source: Ambulatory Visit | Attending: Family Medicine | Admitting: Family Medicine

## 2018-08-22 ENCOUNTER — Ambulatory Visit (INDEPENDENT_AMBULATORY_CARE_PROVIDER_SITE_OTHER): Payer: 59 | Admitting: Family Medicine

## 2018-08-22 VITALS — BP 128/68 | HR 78 | Temp 98.5°F | Resp 16 | Ht 63.0 in | Wt 132.0 lb

## 2018-08-22 DIAGNOSIS — Z124 Encounter for screening for malignant neoplasm of cervix: Secondary | ICD-10-CM

## 2018-08-22 DIAGNOSIS — B977 Papillomavirus as the cause of diseases classified elsewhere: Secondary | ICD-10-CM

## 2018-08-22 DIAGNOSIS — M810 Age-related osteoporosis without current pathological fracture: Secondary | ICD-10-CM

## 2018-08-22 DIAGNOSIS — L578 Other skin changes due to chronic exposure to nonionizing radiation: Secondary | ICD-10-CM

## 2018-08-22 DIAGNOSIS — Z23 Encounter for immunization: Secondary | ICD-10-CM

## 2018-08-22 DIAGNOSIS — Z0000 Encounter for general adult medical examination without abnormal findings: Secondary | ICD-10-CM

## 2018-08-22 DIAGNOSIS — R739 Hyperglycemia, unspecified: Secondary | ICD-10-CM

## 2018-08-22 DIAGNOSIS — E782 Mixed hyperlipidemia: Secondary | ICD-10-CM

## 2018-08-22 LAB — LIPID PANEL
CHOL/HDL RATIO: 4
Cholesterol: 220 mg/dL — ABNORMAL HIGH (ref 0–200)
HDL: 53.3 mg/dL (ref >39.00)
LDL Cholesterol: 136 mg/dL — ABNORMAL HIGH (ref 0–99)
NONHDL: 167.08
Triglycerides: 153 mg/dL — ABNORMAL HIGH (ref 0.0–149.0)
VLDL: 30.6 mg/dL (ref 0.0–40.0)

## 2018-08-22 LAB — COMPREHENSIVE METABOLIC PANEL
ALK PHOS: 39 U/L (ref 39–117)
ALT: 12 U/L (ref 0–35)
AST: 16 U/L (ref 0–37)
Albumin: 4.5 g/dL (ref 3.5–5.2)
BILIRUBIN TOTAL: 0.7 mg/dL (ref 0.2–1.2)
BUN: 17 mg/dL (ref 6–23)
CHLORIDE: 103 meq/L (ref 96–112)
CO2: 31 mEq/L (ref 19–32)
CREATININE: 0.77 mg/dL (ref 0.40–1.20)
Calcium: 9.7 mg/dL (ref 8.4–10.5)
GFR: 80.25 mL/min (ref >60.00)
GLUCOSE: 100 mg/dL — AB (ref 70–99)
Potassium: 4.2 mEq/L (ref 3.5–5.1)
SODIUM: 139 meq/L (ref 135–145)
Total Protein: 6.6 g/dL (ref 6.0–8.3)

## 2018-08-22 LAB — CBC
HEMATOCRIT: 44.7 % (ref 36.0–46.0)
HEMOGLOBIN: 15.1 g/dL — AB (ref 12.0–15.0)
MCHC: 33.7 g/dL (ref 30.0–36.0)
MCV: 92.6 fl (ref 78.0–100.0)
Platelets: 325 10*3/uL (ref 150.0–400.0)
RBC: 4.83 Mil/uL (ref 3.87–5.11)
RDW: 12.3 % (ref 11.5–15.5)
WBC: 4.7 10*3/uL (ref 4.0–10.5)

## 2018-08-22 LAB — VITAMIN D 25 HYDROXY (VIT D DEFICIENCY, FRACTURES): VITD: 46.07 ng/mL (ref 30.00–100.00)

## 2018-08-22 LAB — TSH: TSH: 3.71 u[IU]/mL (ref 0.35–4.50)

## 2018-08-22 LAB — HEMOGLOBIN A1C: Hgb A1c MFr Bld: 5.5 % (ref 4.6–6.5)

## 2018-08-22 MED ORDER — TRETINOIN 0.05 % EX CREA: TOPICAL_CREAM | Freq: Every day | CUTANEOUS | 3 refills | Status: DC

## 2018-08-22 NOTE — Telephone Encounter (Signed)
Pt wants to schedule Prolia injection. She states she had 2 this year and wants to go ahead and schedule for 6 months. Please advise if patient is okay to schedule appt. She has [employer] insurance.

## 2018-08-22 NOTE — Patient Instructions (Signed)
Preventive Care 40-64 Years, Female Preventive care refers to lifestyle choices and visits with your health care provider that can promote health and wellness. What does preventive care include?  A yearly physical exam. This is also called an annual well check.  Dental exams once or twice a year.  Routine eye exams. Ask your health care provider how often you should have your eyes checked.  Personal lifestyle choices, including: ? Daily care of your teeth and gums. ? Regular physical activity. ? Eating a healthy diet. ? Avoiding tobacco and drug use. ? Limiting alcohol use. ? Practicing safe sex. ? Taking low-dose aspirin daily starting at age 58. ? Taking vitamin and mineral supplements as recommended by your health care provider. What happens during an annual well check? The services and screenings done by your health care provider during your annual well check will depend on your age, overall health, lifestyle risk factors, and family history of disease. Counseling Your health care provider may ask you questions about your:  Alcohol use.  Tobacco use.  Drug use.  Emotional well-being.  Home and relationship well-being.  Sexual activity.  Eating habits.  Work and work Statistician.  Method of birth control.  Menstrual cycle.  Pregnancy history.  Screening You may have the following tests or measurements:  Height, weight, and BMI.  Blood pressure.  Lipid and cholesterol levels. These may be checked every 5 years, or more frequently if you are over 81 years old.  Skin check.  Lung cancer screening. You may have this screening every year starting at age 78 if you have a 30-pack-year history of smoking and currently smoke or have quit within the past 15 years.  Fecal occult blood test (FOBT) of the stool. You may have this test every year starting at age 65.  Flexible sigmoidoscopy or colonoscopy. You may have a sigmoidoscopy every 5 years or a colonoscopy  every 10 years starting at age 30.  Hepatitis C blood test.  Hepatitis B blood test.  Sexually transmitted disease (STD) testing.  Diabetes screening. This is done by checking your blood sugar (glucose) after you have not eaten for a while (fasting). You may have this done every 1-3 years.  Mammogram. This may be done every 1-2 years. Talk to your health care provider about when you should start having regular mammograms. This may depend on whether you have a family history of breast cancer.  BRCA-related cancer screening. This may be done if you have a family history of breast, ovarian, tubal, or peritoneal cancers.  Pelvic exam and Pap test. This may be done every 3 years starting at age 80. Starting at age 36, this may be done every 5 years if you have a Pap test in combination with an HPV test.  Bone density scan. This is done to screen for osteoporosis. You may have this scan if you are at high risk for osteoporosis.  Discuss your test results, treatment options, and if necessary, the need for more tests with your health care provider. Vaccines Your health care provider may recommend certain vaccines, such as:  Influenza vaccine. This is recommended every year.  Tetanus, diphtheria, and acellular pertussis (Tdap, Td) vaccine. You may need a Td booster every 10 years.  Varicella vaccine. You may need this if you have not been vaccinated.  Zoster vaccine. You may need this after age 5.  Measles, mumps, and rubella (MMR) vaccine. You may need at least one dose of MMR if you were born in  1957 or later. You may also need a second dose.  Pneumococcal 13-valent conjugate (PCV13) vaccine. You may need this if you have certain conditions and were not previously vaccinated.  Pneumococcal polysaccharide (PPSV23) vaccine. You may need one or two doses if you smoke cigarettes or if you have certain conditions.  Meningococcal vaccine. You may need this if you have certain  conditions.  Hepatitis A vaccine. You may need this if you have certain conditions or if you travel or work in places where you may be exposed to hepatitis A.  Hepatitis B vaccine. You may need this if you have certain conditions or if you travel or work in places where you may be exposed to hepatitis B.  Haemophilus influenzae type b (Hib) vaccine. You may need this if you have certain conditions.  Talk to your health care provider about which screenings and vaccines you need and how often you need them. This information is not intended to replace advice given to you by your health care provider. Make sure you discuss any questions you have with your health care provider. Document Released: 10/29/2015 Document Revised: 06/21/2016 Document Reviewed: 08/03/2015 Elsevier Interactive Patient Education  2018 [employer]  

## 2018-08-22 NOTE — Assessment & Plan Note (Signed)
Tolerating Prolia and Encouraged to get adequate exercise, calcium and vitamin d intake

## 2018-08-22 NOTE — Assessment & Plan Note (Signed)
Patient encouraged to maintain heart healthy diet, regular exercise, adequate sleep. Consider daily probiotics. Take medications as prescribed 

## 2018-08-22 NOTE — Telephone Encounter (Signed)
PA approved.

## 2018-08-22 NOTE — Assessment & Plan Note (Signed)
hgba1c acceptable, minimize simple carbs. Increase exercise as tolerated.  

## 2018-08-22 NOTE — Assessment & Plan Note (Signed)
Pap today, no concerns on exam.  

## 2018-08-22 NOTE — Telephone Encounter (Signed)
PA initiated via Covermymeds; KEY: [id number]. Awaiting determination.

## 2018-08-22 NOTE — Assessment & Plan Note (Signed)
Encouraged heart healthy diet, increase exercise, avoid trans fats, consider a krill oil cap daily 

## 2018-08-23 LAB — CYTOLOGY - PAP
DIAGNOSIS: NEGATIVE
HPV: DETECTED — AB

## 2018-08-26 NOTE — Progress Notes (Signed)
Subjective:    Patient ID: Yolanda Hernandez, female    DOB: [birth year], 62 y.o.   MRN: [id number]  Chief Complaint  Patient presents with  . Annual Exam    HPI Patient is in today for annual preventative exam with Pap smear and follow-up on chronic medical concerns including hyperlipidemia and osteoporosis.  She is noted to have mild hyperglycemia as well but does not endorse polyuria or polydipsia.  Has been staying active and maintaining a heart healthy diet.  Has been watching her carbohydrate intake.  No recent febrile illness or hospitalizations.  Does well with her activities of daily living.  Denies any GYN complaints at today's visit. Denies CP/palp/SOB/HA/congestion/fevers/GI or GU c/o. Taking meds as prescribed  Past Medical History:  Diagnosis Date  . Cerumen impaction    chronic  . Cervical cancer screening 03/12/2015   Menarche at 10 Regular and moderate flow  history of abnormal pap in past, bx and cryo once many years ago, normal since. Last 2011 and normal G0P0, s/p No history of abnormal MGM Last in 2015, no breast concerns  No concerns today Biopsy, cervical: gyn surgeries Menopause at 67 ish  . Hematuria 07/31/2017  . Hyperglycemia 03/19/2013  . Hypertension   . Osteopenia   . Osteoporosis 04/11/2010   Qualifier: Diagnosis of  By: Wynona Luna   . Other and unspecified hyperlipidemia 03/19/2013  . Skin cancer   . Skin cancer 07/14/2016  . Uterine fibroid     Past Surgical History:  Procedure Laterality Date  . BREAST SURGERY     biopsy left  . MYRINGOPLASTY  1972  . TONSILLECTOMY      Family History  Problem Relation Age of Onset  . Osteoporosis Mother   . Hyperlipidemia Mother   . Hypertension Mother   . Heart disease Father   . Diabetes Father   . Hyperlipidemia Father   . Hypertension Father   . Diabetes Sister   . Heart disease Sister        s/p cardiac stent  . Hyperlipidemia Sister   . Hypertension Sister   . Kidney disease Other   .  Alcohol abuse Other   . Colon cancer Neg Hx     Social History   Socioeconomic History  . Marital status: Married    Spouse name: Not on file  . Number of children: Not on file  . Years of education: Not on file  . Highest education level: Not on file  Occupational History  . Not on file  Social Needs  . Financial resource strain: Not on file  . Food insecurity:    Worry: Not on file    Inability: Not on file  . Transportation needs:    Medical: Not on file    Non-medical: Not on file  Tobacco Use  . Smoking status: Never Smoker  . Smokeless tobacco: Never Used  Substance and Sexual Activity  . Alcohol use: Yes    Comment: 1 glass Wine every other week per pt.  . Drug use: No  . Sexual activity: Yes    Comment: lives with husband, works at [employer], no dietary restricitons, exercises with strength training, elliptical etc  Lifestyle  . Physical activity:    Days per week: Not on file    Minutes per session: Not on file  . Stress: Not on file  Relationships  . Social connections:    Talks on phone: Not on file    Gets together:  Not on file    Attends religious service: Not on file    Active member of club or organization: Not on file    Attends meetings of clubs or organizations: Not on file    Relationship status: Not on file  . Intimate partner violence:    Fear of current or ex partner: Not on file    Emotionally abused: Not on file    Physically abused: Not on file    Forced sexual activity: Not on file  Other Topics Concern  . Not on file  Social History Narrative  . Not on file    Outpatient Medications Prior to Visit  Medication Sig Dispense Refill  . Calcium-Vitamin D-Vitamin K (CALCIUM SOFT CHEWS PO) Take 1 each by mouth daily.    Marland Kitchen denosumab (PROLIA) 60 MG/ML SOSY injection Inject 60 mg into the skin every 6 (six) months.    . lactobacillus acidophilus (BACID) TABS tablet Take 2 tablets by mouth 3 (three) times daily.    Marland Kitchen tretinoin  (RETIN-A) 0.05 % cream Apply topically at bedtime.    Marland Kitchen alendronate (FOSAMAX) 70 MG tablet Take 1 tablet (70 mg total) by mouth once a week. Take with a full glass of water on an empty stomach. (Patient not taking: Reported on 08/22/2018) 4 tablet 1   No facility-administered medications prior to visit.     Allergies  Allergen Reactions  . Iodine Rash  . Sulfa Antibiotics Rash    Review of Systems  Constitutional: Negative for chills, fever and malaise/fatigue.  HENT: Negative for congestion and hearing loss.   Eyes: Negative for discharge.  Respiratory: Negative for cough, sputum production and shortness of breath.   Cardiovascular: Negative for chest pain, palpitations and leg swelling.  Gastrointestinal: Negative for abdominal pain, blood in stool, constipation, diarrhea, heartburn, nausea and vomiting.  Genitourinary: Negative for dysuria, frequency, hematuria and urgency.  Musculoskeletal: Negative for back pain, falls and myalgias.  Skin: Negative for rash.  Neurological: Negative for dizziness, sensory change, loss of consciousness, weakness and headaches.  Endo/Heme/Allergies: Negative for environmental allergies. Does not bruise/bleed easily.  Psychiatric/Behavioral: Negative for depression and suicidal ideas. The patient is not nervous/anxious and does not have insomnia.        Objective:    Physical Exam  Constitutional: She is oriented to person, place, and time. No distress.  HENT:  Head: Normocephalic and atraumatic.  Right Ear: External ear normal.  Left Ear: External ear normal.  Nose: Nose normal.  Mouth/Throat: Oropharynx is clear and moist. No oropharyngeal exudate.  Eyes: Pupils are equal, round, and reactive to light. Conjunctivae are normal. Right eye exhibits no discharge. Left eye exhibits no discharge. No scleral icterus.  Neck: Normal range of motion. Neck supple. No thyromegaly present.  Cardiovascular: Normal rate, regular rhythm, normal heart sounds  and intact distal pulses.  No murmur heard. Pulmonary/Chest: Effort normal and breath sounds normal. No respiratory distress. She has no wheezes. She has no rales.  Abdominal: Soft. Bowel sounds are normal. She exhibits no distension and no mass. There is no tenderness.  Genitourinary: Vagina normal and uterus normal. No vaginal discharge found.  Musculoskeletal: Normal range of motion. She exhibits no edema or tenderness.  Lymphadenopathy:    She has no cervical adenopathy.  Neurological: She is alert and oriented to person, place, and time. She has normal reflexes. She displays normal reflexes. No cranial nerve deficit. Coordination normal.  Skin: Skin is warm and dry. No rash noted. She is not diaphoretic.  BP 128/68 (BP Location: Right Arm, Patient Position: Sitting, Cuff Size: Small)   Pulse 78   Temp 98.5 F (36.9 C) (Oral)   Resp 16   Ht 5\' 3"  (1.6 m)   Wt 132 lb (59.9 kg)   SpO2 100%   BMI 23.38 kg/m  Wt Readings from Last 3 Encounters:  08/22/18 132 lb (59.9 kg)  07/31/17 131 lb (59.4 kg)  05/30/17 133 lb (60.3 kg)     Lab Results  Component Value Date   WBC 4.7 08/22/2018   HGB 15.1 (H) 08/22/2018   HCT 44.7 08/22/2018   PLT 325.0 08/22/2018   GLUCOSE 100 (H) 08/22/2018   CHOL 220 (H) 08/22/2018   TRIG 153.0 (H) 08/22/2018   HDL 53.30 08/22/2018   LDLCALC 136 (H) 08/22/2018   ALT 12 08/22/2018   AST 16 08/22/2018   NA 139 08/22/2018   K 4.2 08/22/2018   CL 103 08/22/2018   CREATININE 0.77 08/22/2018   BUN 17 08/22/2018   CO2 31 08/22/2018   TSH 3.71 08/22/2018   HGBA1C 5.5 08/22/2018    Lab Results  Component Value Date   TSH 3.71 08/22/2018   Lab Results  Component Value Date   WBC 4.7 08/22/2018   HGB 15.1 (H) 08/22/2018   HCT 44.7 08/22/2018   MCV 92.6 08/22/2018   PLT 325.0 08/22/2018   Lab Results  Component Value Date   NA 139 08/22/2018   K 4.2 08/22/2018   CO2 31 08/22/2018   GLUCOSE 100 (H) 08/22/2018   BUN 17 08/22/2018    CREATININE 0.77 08/22/2018   BILITOT 0.7 08/22/2018   ALKPHOS 39 08/22/2018   AST 16 08/22/2018   ALT 12 08/22/2018   PROT 6.6 08/22/2018   ALBUMIN 4.5 08/22/2018   CALCIUM 9.7 08/22/2018   GFR 80.25 08/22/2018   Lab Results  Component Value Date   CHOL 220 (H) 08/22/2018   Lab Results  Component Value Date   HDL 53.30 08/22/2018   Lab Results  Component Value Date   LDLCALC 136 (H) 08/22/2018   Lab Results  Component Value Date   TRIG 153.0 (H) 08/22/2018   Lab Results  Component Value Date   CHOLHDL 4 08/22/2018   Lab Results  Component Value Date   HGBA1C 5.5 08/22/2018       Assessment & Plan:   Problem List Items Addressed This Visit    Osteoporosis    Tolerating Prolia and Encouraged to get adequate exercise, calcium and vitamin d intake      Relevant Medications   denosumab (PROLIA) 60 MG/ML SOSY injection   Other Relevant Orders   VITAMIN D 25 Hydroxy (Vit-D Deficiency, Fractures) (Completed)   Preventative health care    Patient encouraged to maintain heart healthy diet, regular exercise, adequate sleep. Consider daily probiotics. Take medications as prescribed.       Relevant Orders   Hemoglobin A1c (Completed)   CBC (Completed)   Comprehensive metabolic panel (Completed)   TSH (Completed)   Hyperlipidemia, mixed    Encouraged heart healthy diet, increase exercise, avoid trans fats, consider a krill oil cap daily      Relevant Orders   Lipid panel (Completed)   Hyperglycemia    hgba1c acceptable, minimize simple carbs. Increase exercise as tolerated.        Relevant Orders   Hemoglobin A1c (Completed)   Comprehensive metabolic panel (Completed)   Cervical cancer screening    Pap today, no concerns on exam.  Relevant Orders   Cytology - PAP( [place]) (Completed)    Other Visit Diagnoses    Sun-damaged skin    -  Primary   Relevant Orders   Ambulatory referral to Dermatology   Need for diphtheria-tetanus-pertussis  (Tdap) vaccine       Relevant Orders   Tdap vaccine greater than or equal to 7yo IM (Completed)      I have discontinued Christean Grief. Forsman's alendronate. I am also having her maintain her Calcium-Vitamin D-Vitamin K (CALCIUM SOFT CHEWS PO), denosumab, lactobacillus acidophilus, and tretinoin.  Meds ordered this encounter  Medications  . tretinoin (RETIN-A) 0.05 % cream    Sig: Apply topically at bedtime.    Dispense:  45 g    Refill:  3     Penni Homans, MD

## 2018-08-26 NOTE — Telephone Encounter (Signed)
Author phoned pt. to notify that she is not due until after 12/06/2017 for prolia, and to call back in February to schedule appointment pending new year insurance approval and prolia stock availability. Pt. Stated she would call in January to check on status of approval and make an appointment for after 2/21. Routed to [place], [place] and [place], [place] as Juluis Rainier.

## 2018-08-30 NOTE — Addendum Note (Signed)
Addended by: Magdalene Molly A on: 08/30/2018 02:58 PM   Modules accepted: Orders

## 2018-09-02 ENCOUNTER — Telehealth: Payer: Self-pay | Admitting: Obstetrics and Gynecology

## 2018-09-02 ENCOUNTER — Encounter: Payer: Self-pay | Admitting: Obstetrics and Gynecology

## 2018-09-02 NOTE — Telephone Encounter (Signed)
Called and left a message for patient to call back to schedule a new patient doctor referral appointment with our office to see any provider for: HPV in pap smear results.

## 2018-09-18 ENCOUNTER — Other Ambulatory Visit (HOSPITAL_COMMUNITY)
Admission: RE | Admit: 2018-09-18 | Discharge: 2018-09-18 | Disposition: A | Discharge status: Home / Self Care | Payer: 59 | Source: Ambulatory Visit | Attending: Obstetrics and Gynecology | Admitting: Obstetrics and Gynecology

## 2018-09-18 ENCOUNTER — Ambulatory Visit: Payer: 59 | Admitting: Obstetrics and Gynecology

## 2018-09-18 ENCOUNTER — Encounter: Payer: Self-pay | Admitting: Obstetrics and Gynecology

## 2018-09-18 ENCOUNTER — Other Ambulatory Visit: Payer: Self-pay

## 2018-09-18 VITALS — BP 122/76 | HR 84 | Ht 63.0 in | Wt 132.0 lb

## 2018-09-18 DIAGNOSIS — B977 Papillomavirus as the cause of diseases classified elsewhere: Secondary | ICD-10-CM | POA: Insufficient documentation

## 2018-09-18 DIAGNOSIS — N952 Postmenopausal atrophic vaginitis: Secondary | ICD-10-CM

## 2018-09-18 DIAGNOSIS — Z0000 Encounter for general adult medical examination without abnormal findings: Secondary | ICD-10-CM | POA: Diagnosis not present

## 2018-09-18 NOTE — Patient Instructions (Signed)
HPV Test The human papillomavirus (HPV) test is used to look for high-risk types of HPV infection. HPV is a group of about 100 viruses. Many of these viruses cause growths on, in, or around the genitals. Most HPV viruses cause infections that usually go away without treatment. However, HPV types 6, 11, 16, and 18 are considered high-risk types of HPV that can increase your risk of cancer of the cervix or anus if the infection is left untreated. An HPV test identifies the DNA (genetic) strands of the HPV infection, so it is also referred to as the HPV DNA test. Although HPV is found in both males and females, the HPV test is only used to screen for increased cancer risk in females:  With an abnormal Pap test.  After treatment of an abnormal Pap test.  Between the ages of 30 and 65.  After treatment of a high-risk HPV infection.  The HPV test may be done at the same time as a pelvic exam and Pap test in females over the age of 30. Both the HPV test and Pap test require a sample of cells from the cervix. How do I prepare for this test?  Do not douche or take a bath for 24-48 hours before the test or as directed by your health care provider.  Do not have sex for 24-48 hours before the test or as directed by your health care provider.  You may be asked to reschedule the test if you are menstruating.  You will be asked to urinate before the test. What do the results mean? It is your responsibility to obtain your test results. Ask the lab or department performing the test when and how you will get your results. Talk with your health care provider if you have any questions about your results. Your result will be negative or positive. Meaning of Negative Test Results A negative HPV test result means that no HPV was found, and it is very likely that you do not have HPV. Meaning of Positive Test Results A positive HPV test result indicates that you have HPV.  If your test result shows the presence  of any high-risk HPV strains, you may have an increased risk of developing cancer of the cervix or anus if the infection is left untreated.  If any low-risk HPV strains are found, you are not likely to have an increased risk of cancer.  Discuss your test results with your health care provider. He or she will use the results to make a diagnosis and determine a treatment plan that is right for you. Talk with your health care provider to discuss your results, treatment options, and if necessary, the need for more tests. Talk with your health care provider if you have any questions about your results. This information is not intended to replace advice given to you by your health care provider. Make sure you discuss any questions you have with your health care provider. Document Released: 10/27/2004 Document Revised: 06/07/2016 Document Reviewed: 02/17/2014 Elsevier Interactive Patient Education  2018 Elsevier Inc.  

## 2018-09-18 NOTE — Progress Notes (Signed)
62 y.o. [place] Married White or Caucasian Not Hispanic or Latino female here for a consultation from Dr Charlett Blake for an abnormal pap. Her pap returned as negative with +HPV.  Prior pap was in 5/16 and was negative (no HPV testing done). She has been with her husband for 30 years, has no worries about infidelity. She had an abnormal pap in her 20's, had a procedure done on her cervix in the surgery center.    No LMP recorded. Patient is postmenopausal.          Sexually active: Yes.    The current method of family planning is post menopausal status.    Exercising: Yes.    strength training, treadmill Smoker:  no  Health Maintenance: Pap:  08/22/2018 positive HPV History of abnormal Pap:  Yes, in her 30's had cyrosurgery MMG:  05/15/2018 Birads 1 negative BMD:   04/25/2017 Osteoporosis Colonoscopy: 05/30/2017 polyp, repeat in 5 years TDaP:  08/22/2018 Gardasil: N/A   reports that she has never smoked. She has never used smokeless tobacco. She reports that she drinks alcohol. She reports that she does not use drugs.  Past Medical History:  Diagnosis Date  . Cerumen impaction    chronic  . Cervical cancer screening 03/12/2015   Menarche at 10 Regular and moderate flow  history of abnormal pap in past, bx and cryo once many years ago, normal since. Last 2011 and normal G0P0, s/p No history of abnormal MGM Last in 2015, no breast concerns  No concerns today Biopsy, cervical: gyn surgeries Menopause at 34 ish  . Dysmenorrhea   . Hematuria 07/31/2017  . Hyperglycemia 03/19/2013  . Hypertension   . Osteopenia   . Osteoporosis 04/11/2010   Qualifier: Diagnosis of  By: Wynona Luna   . Other and unspecified hyperlipidemia 03/19/2013  . Skin cancer   . Skin cancer 07/14/2016  . Uterine fibroid     Past Surgical History:  Procedure Laterality Date  . BREAST SURGERY     biopsy left  . COLPOSCOPY    . GYNECOLOGIC CRYOSURGERY    . MYRINGOPLASTY  1972  . TONSILLECTOMY      Current Outpatient  Medications  Medication Sig Dispense Refill  . Calcium-Vitamin D-Vitamin K (CALCIUM SOFT CHEWS PO) Take 1 each by mouth daily.    Marland Kitchen denosumab (PROLIA) 60 MG/ML SOSY injection Inject 60 mg into the skin every 6 (six) months.    . lactobacillus acidophilus (BACID) TABS tablet Take 2 tablets by mouth 3 (three) times daily.    Marland Kitchen tretinoin (RETIN-A) 0.05 % cream Apply topically at bedtime. 45 g 3   No current facility-administered medications for this visit.     Family History  Problem Relation Age of Onset  . Osteoporosis Mother   . Hyperlipidemia Mother   . Hypertension Mother   . Heart disease Father   . Diabetes Father   . Hyperlipidemia Father   . Hypertension Father   . Diabetes Sister   . Heart disease Sister        s/p cardiac stent  . Hyperlipidemia Sister   . Hypertension Sister   . Kidney disease Other   . Alcohol abuse Other   . Colon cancer Neg Hx     Review of Systems  Constitutional: Negative.   HENT: Negative.   Eyes: Negative.   Respiratory: Negative.   Cardiovascular: Negative.   Gastrointestinal: Negative.   Endocrine: Negative.   Genitourinary: Negative.   Musculoskeletal: Negative.   Skin: Negative.  Allergic/Immunologic: Negative.   Neurological: Negative.   Hematological: Negative.   Psychiatric/Behavioral: Negative.     Exam:   BP 122/76 (BP Location: Right Arm, Patient Position: Sitting, Cuff Size: Normal)   Pulse 84   Ht 5\' 3"  (1.6 m)   Wt 132 lb (59.9 kg)   BMI 23.38 kg/m   Weight change: @WEIGHTCHANGE @ Height:   Height: 5\' 3"  (160 cm)  Ht Readings from Last 3 Encounters:  09/18/18 5\' 3"  (1.6 m)  08/22/18 5\' 3"  (1.6 m)  07/31/17 5\' 3"  (1.6 m)    General appearance: alert, cooperative and appears stated age Neurologic: Grossly normal   Pelvic: External genitalia:  no lesions              Urethra:  normal appearing urethra with no masses, tenderness or lesions              Bartholins and Skenes: normal                 Vagina:  atrophic appearing vagina with normal color and discharge, no lesions              Cervix: no lesions and short, not stenotic.                 Chaperone was present for exam.  A:  Negative pap with +HPV  P:   Send off subtyping for HPV 16/18/45  If the test is + she will need a colposcopy, if negative f/u pap with hpv in one year  She has anxiety about possible colposcopy, may need ativan  Discussed HPV   CC: Dr Charlett Blake Letter sent

## 2018-09-25 LAB — CERVICOVAGINAL ANCILLARY ONLY: HPV 16/18/45 genotyping: NEGATIVE

## 2018-10-29 NOTE — Telephone Encounter (Signed)
Prolia--benefits requested.

## 2018-11-04 NOTE — Telephone Encounter (Signed)
Benefits received- PA required, initiated via Covermymeds; KEY: [id number]. Awaiting determination.

## 2018-11-05 NOTE — Telephone Encounter (Signed)
PA denied. Product excluded from pharmacy benefit. However may be covered through medical benefits- will need to call [facility] at [phone] for determination. Will forward to [employer] and [employer] for [place].

## 2018-11-11 NOTE — Telephone Encounter (Signed)
Meeting with prolia rep Wednesday to help with denial at 10:00.

## 2018-11-13 NOTE — Telephone Encounter (Signed)
Met with pa denial rep Shavawn with prolia. She gave me a number to call-patient's injection was denied due to sending Rx to local pharmacy rather than specialty. I have verbally given new rx to speciality pharmacy for patient. Will arrive in 5-7 business days. Will call patient to be on lookout for phone call from [facility] to let her know the rx is shipped.

## 2018-11-19 DIAGNOSIS — Z1283 Encounter for screening for malignant neoplasm of skin: Secondary | ICD-10-CM | POA: Diagnosis not present

## 2018-11-19 DIAGNOSIS — L821 Other seborrheic keratosis: Secondary | ICD-10-CM | POA: Diagnosis not present

## 2018-11-19 DIAGNOSIS — L918 Other hypertrophic disorders of the skin: Secondary | ICD-10-CM | POA: Diagnosis not present

## 2018-11-21 ENCOUNTER — Encounter: Payer: Self-pay | Admitting: Family Medicine

## 2018-11-21 ENCOUNTER — Telehealth: Payer: Self-pay | Admitting: Family Medicine

## 2018-11-21 NOTE — Telephone Encounter (Signed)
Copied from [place] [id number]. Topic: Quick Communication - See Telephone Encounter >> Nov 21, 2018  2:27 PM Rutherford Nail, NT wrote: CRM for notification. See Telephone encounter for: 11/21/18. Patient calling and would like a call from who does the authorizations for Prolia injections. States that she received a rejection letter. States that the authorization was requested through her pharmacy benefits and should've been submitted through her health benefits. Please advise. Would like a call to discuss.  CB#: [phone]

## 2018-11-25 NOTE — Telephone Encounter (Signed)
Please advise 

## 2018-11-25 NOTE — Telephone Encounter (Signed)
Called and spoke with patient. She states her prolia needed to be filled with medical benefits not pharmacy benefits. She gave me [phone] to call and solve issue. I spoke with Shavawn-rep that deals with denials. She gave me number [phone] number to speak with [facility] medical benefits department. Spoke with pharmacist she states prolia will be 0.00 co pay for patient. Prolia will be shipped to our office and should arrive on 11/27/2018. Patient has been scheduled for injection on 12/10/2018. Patient due any time after 12/07/2018.

## 2018-11-26 DIAGNOSIS — M810 Age-related osteoporosis without current pathological fracture: Secondary | ICD-10-CM | POA: Diagnosis not present

## 2018-12-10 ENCOUNTER — Ambulatory Visit (INDEPENDENT_AMBULATORY_CARE_PROVIDER_SITE_OTHER): Payer: 59 | Admitting: *Deleted

## 2018-12-10 DIAGNOSIS — M810 Age-related osteoporosis without current pathological fracture: Secondary | ICD-10-CM

## 2018-12-10 MED ORDER — DENOSUMAB 60 MG/ML ~~LOC~~ SOSY
60.0000 mg | PREFILLED_SYRINGE | Freq: Once | SUBCUTANEOUS | Status: AC
  Administered 2018-12-10: 60 mg via SUBCUTANEOUS

## 2018-12-10 NOTE — Progress Notes (Signed)
Patient came in today for Prolia injection  Injection given and patient tolerated well.

## 2019-05-14 ENCOUNTER — Telehealth: Payer: Self-pay | Admitting: Family Medicine

## 2019-05-14 NOTE — Telephone Encounter (Signed)
Pt called to have the authorization process for her Prolia injection started. Pt is due in August and would like to schedule once it has been approved / please advise

## 2019-05-21 LAB — HM MAMMOGRAPHY

## 2019-05-21 NOTE — Telephone Encounter (Signed)
Verified in portal, 05/21/2019 waiting on summary of benefits from insurance.

## 2019-05-27 ENCOUNTER — Encounter: Payer: Self-pay | Admitting: Family Medicine

## 2019-06-09 NOTE — Telephone Encounter (Signed)
Patient would like the status of prior authorization for the Prolia Injection.

## 2019-06-13 ENCOUNTER — Other Ambulatory Visit: Payer: Self-pay

## 2019-06-13 ENCOUNTER — Ambulatory Visit (INDEPENDENT_AMBULATORY_CARE_PROVIDER_SITE_OTHER): Payer: 59

## 2019-06-13 DIAGNOSIS — M810 Age-related osteoporosis without current pathological fracture: Secondary | ICD-10-CM

## 2019-06-13 MED ORDER — DENOSUMAB 60 MG/ML ~~LOC~~ SOSY
60.0000 mg | PREFILLED_SYRINGE | Freq: Once | SUBCUTANEOUS | Status: AC
  Administered 2019-06-13: 60 mg via SUBCUTANEOUS

## 2019-06-13 NOTE — Progress Notes (Addendum)
Patient came in today to have her Prolia per Dr. Charlett Blake. Patient tolerated 60mg /mL SQ in her left arm. She had no complications   Nursing note reviewed. Agree with documention and plan.

## 2019-06-16 NOTE — Telephone Encounter (Signed)
Please advise 

## 2019-06-18 NOTE — Telephone Encounter (Signed)
Patent received her injection Friday-no pa needed.

## 2019-09-22 ENCOUNTER — Ambulatory Visit: Payer: 59 | Admitting: Obstetrics and Gynecology

## 2019-09-29 ENCOUNTER — Other Ambulatory Visit: Payer: Self-pay

## 2019-09-29 ENCOUNTER — Encounter: Payer: Self-pay | Admitting: Family Medicine

## 2019-09-29 NOTE — Progress Notes (Addendum)
63 y.o. G0P0000 Married White or Caucasian Not Hispanic or Latino female here for annual exam.  No concerns. No vaginal bleeding. Sexually active without penetration.   She has had borderline HTN, never on medication.     No LMP recorded. Patient is postmenopausal.          Sexually active: Yes.    The current method of family planning is post menopausal status.    Exercising: Yes.    walking, yoga, aerobics Smoker:  no  Health Maintenance: Pap:  08/22/2018 positive HPV neg 16/18/45 History of abnormal Pap:  Yes, in her 62's had cyrosurgery MMG:  05/21/2019 Birads 1 negative BMD:   04/25/2017 Osteoporosis, managed by Primary. DEXA today Colonoscopy: 05/30/2017 polyp, repeat in 5 years TDaP:  08/22/2018 Gardasil: N/A   reports that she has never smoked. She has never used smokeless tobacco. She reports current alcohol use. She reports that she does not use drugs. Drinks a glass of wine a month. She is a retired Electrical engineer. Husband has sarcoidosis.   Past Medical History:  Diagnosis Date  . Cerumen impaction    chronic  . Cervical cancer screening 03/12/2015   Menarche at 10 Regular and moderate flow  history of abnormal pap in past, bx and cryo once many years ago, normal since. Last 2011 and normal G0P0, s/p No history of abnormal MGM Last in 2015, no breast concerns  No concerns today Biopsy, cervical: gyn surgeries Menopause at 35 ish  . Dysmenorrhea   . Hematuria 07/31/2017  . Hyperglycemia 03/19/2013  . Hypertension   . Osteopenia   . Osteoporosis 04/11/2010   Qualifier: Diagnosis of  By: Wynona Luna   . Other and unspecified hyperlipidemia 03/19/2013  . Skin cancer   . Skin cancer 07/14/2016  . Uterine fibroid     Past Surgical History:  Procedure Laterality Date  . BREAST SURGERY     biopsy left  . COLPOSCOPY    . GYNECOLOGIC CRYOSURGERY    . MYRINGOPLASTY  1972  . TONSILLECTOMY      Current Outpatient Medications  Medication Sig Dispense Refill  .  Calcium-Vitamin D-Vitamin K (CALCIUM SOFT CHEWS PO) Take 1 each by mouth daily.    Marland Kitchen denosumab (PROLIA) 60 MG/ML SOSY injection Inject 60 mg into the skin every 6 (six) months.    . Probiotic Product (PROBIOTIC PO) Take by mouth.    . tretinoin (RETIN-A) 0.05 % cream Apply topically at bedtime. 45 g 3   No current facility-administered medications for this visit.    Family History  Problem Relation Age of Onset  . Osteoporosis Mother   . Hyperlipidemia Mother   . Hypertension Mother   . Heart disease Father   . Diabetes Father   . Hyperlipidemia Father   . Hypertension Father   . Diabetes Sister   . Heart disease Sister        s/p cardiac stent  . Hyperlipidemia Sister   . Hypertension Sister   . Osteoporosis Sister   . Kidney disease Other   . Alcohol abuse Other   . Colon cancer Neg Hx     Review of Systems  Constitutional: Negative.   HENT: Negative.   Eyes: Negative.   Respiratory: Negative.   Cardiovascular: Negative.   Gastrointestinal: Negative.   Endocrine: Negative.   Genitourinary: Negative.   Musculoskeletal: Negative.   Skin: Negative.   Allergic/Immunologic: Negative.   Neurological: Negative.   Hematological: Negative.   Psychiatric/Behavioral: Negative.  Exam:   BP 124/82 (BP Location: Right Arm, Patient Position: Sitting, Cuff Size: Normal)   Pulse 88   Temp (!) 97.2 F (36.2 C) (Skin)   Ht 5' 2.21" (1.58 m)   Wt 128 lb 3.2 oz (58.2 kg)   BMI 23.29 kg/m   Weight change: @WEIGHTCHANGE @ Height:   Height: 5' 2.21" (158 cm)  Ht Readings from Last 3 Encounters:  09/30/19 5' 2.21" (1.58 m)  09/18/18 5\' 3"  (1.6 m)  08/22/18 5\' 3"  (1.6 m)    General appearance: alert, cooperative and appears stated age Head: Normocephalic, without obvious abnormality, atraumatic Neck: no adenopathy, supple, symmetrical, trachea midline and thyroid normal to inspection and palpation Lungs: clear to auscultation bilaterally Cardiovascular: regular rate and  rhythm Breasts: normal appearance, no masses or tenderness, bilateral fibrocystic changes Abdomen: soft, non-tender; non distended,  no masses,  no organomegaly Extremities: extremities normal, atraumatic, no cyanosis or edema Skin: Skin color, texture, turgor normal. No rashes or lesions Lymph nodes: Cervical, supraclavicular, and axillary nodes normal. No abnormal inguinal nodes palpated Neurologic: Grossly normal   Pelvic: External genitalia:  no lesions              Urethra:  normal appearing urethra with no masses, tenderness or lesions              Bartholins and Skenes: normal                 Vagina: atrophic appearing vagina with normal color and discharge, no lesions. Need pediatric speculum.               Cervix: no lesions               Bimanual Exam:  Uterus:  retroverted, mobile, ~8 week sized (h/o fibroids)              Adnexa: no mass, fullness, tenderness               Rectovaginal: Confirms               Anus:  normal sphincter tone, no lesions  Terence Lux chaperoned for the exam.  A:  Well Woman with normal exam  H/O HPV  H/O osteoporosis, followed by primary  H/o fibroid uterus, on questioning she remembers being told her uterus was 12 week sized (prior to menopause)  P:   Pap with hpv  Mammogram and colonoscopy UTD  Screening labs (fasting), vit D, HgbA1C  Discussed breast self exam  Discussed calcium and vit D intake  Will get a copy of her GYN records   10/26/19 Addendum: Records reviewed In 1/13 her uterus was described as 10-12 week sized. In 11/11 uterus was described as 12-14 week sized.  11/03/11 Pap negative

## 2019-09-30 ENCOUNTER — Ambulatory Visit (INDEPENDENT_AMBULATORY_CARE_PROVIDER_SITE_OTHER): Payer: 59 | Admitting: Obstetrics and Gynecology

## 2019-09-30 ENCOUNTER — Encounter: Payer: Self-pay | Admitting: Obstetrics and Gynecology

## 2019-09-30 ENCOUNTER — Other Ambulatory Visit (HOSPITAL_COMMUNITY)
Admission: RE | Admit: 2019-09-30 | Discharge: 2019-09-30 | Disposition: A | Discharge status: Home / Self Care | Payer: 59 | Source: Ambulatory Visit | Attending: Obstetrics and Gynecology | Admitting: Obstetrics and Gynecology

## 2019-09-30 VITALS — BP 124/82 | HR 88 | Temp 97.2°F | Ht 62.21 in | Wt 128.2 lb

## 2019-09-30 DIAGNOSIS — Z01419 Encounter for gynecological examination (general) (routine) without abnormal findings: Secondary | ICD-10-CM

## 2019-09-30 DIAGNOSIS — Z8739 Personal history of other diseases of the musculoskeletal system and connective tissue: Secondary | ICD-10-CM

## 2019-09-30 DIAGNOSIS — Z124 Encounter for screening for malignant neoplasm of cervix: Secondary | ICD-10-CM | POA: Insufficient documentation

## 2019-09-30 DIAGNOSIS — E782 Mixed hyperlipidemia: Secondary | ICD-10-CM

## 2019-09-30 DIAGNOSIS — R739 Hyperglycemia, unspecified: Secondary | ICD-10-CM

## 2019-09-30 DIAGNOSIS — B977 Papillomavirus as the cause of diseases classified elsewhere: Secondary | ICD-10-CM | POA: Diagnosis not present

## 2019-09-30 DIAGNOSIS — Z1151 Encounter for screening for human papillomavirus (HPV): Secondary | ICD-10-CM | POA: Diagnosis not present

## 2019-09-30 DIAGNOSIS — Z0000 Encounter for general adult medical examination without abnormal findings: Secondary | ICD-10-CM

## 2019-09-30 DIAGNOSIS — D259 Leiomyoma of uterus, unspecified: Secondary | ICD-10-CM

## 2019-09-30 DIAGNOSIS — Z8619 Personal history of other infectious and parasitic diseases: Secondary | ICD-10-CM | POA: Insufficient documentation

## 2019-09-30 DIAGNOSIS — Z833 Family history of diabetes mellitus: Secondary | ICD-10-CM

## 2019-09-30 LAB — HM DEXA SCAN

## 2019-09-30 NOTE — Patient Instructions (Signed)

## 2019-10-01 LAB — CBC
Hematocrit: 46 % (ref 34.0–46.6)
Hemoglobin: 15.6 g/dL (ref 11.1–15.9)
MCH: 32.2 pg (ref 26.6–33.0)
MCHC: 33.9 g/dL (ref 31.5–35.7)
MCV: 95 fL (ref 79–97)
Platelets: 391 10*3/uL (ref 150–450)
RBC: 4.85 x10E6/uL (ref 3.77–5.28)
RDW: 11.8 % (ref 11.7–15.4)
WBC: 5.4 10*3/uL (ref 3.4–10.8)

## 2019-10-01 LAB — COMPREHENSIVE METABOLIC PANEL
ALT: 11 IU/L (ref 0–32)
AST: 16 IU/L (ref 0–40)
Albumin/Globulin Ratio: 2.4 — ABNORMAL HIGH (ref 1.2–2.2)
Albumin: 4.5 g/dL (ref 3.8–4.8)
Alkaline Phosphatase: 44 IU/L (ref 39–117)
BUN/Creatinine Ratio: 17 (ref 12–28)
BUN: 13 mg/dL (ref 8–27)
Bilirubin Total: 0.5 mg/dL (ref 0.0–1.2)
CO2: 27 mmol/L (ref 20–29)
Calcium: 9.4 mg/dL (ref 8.7–10.3)
Chloride: 102 mmol/L (ref 96–106)
Creatinine, Ser: 0.78 mg/dL (ref 0.57–1.00)
GFR calc Af Amer: 93 mL/min/{1.73_m2} (ref >59)
GFR calc non Af Amer: 81 mL/min/{1.73_m2} (ref >59)
Globulin, Total: 1.9 g/dL (ref 1.5–4.5)
Glucose: 85 mg/dL (ref 65–99)
Potassium: 4.2 mmol/L (ref 3.5–5.2)
Sodium: 140 mmol/L (ref 134–144)
Total Protein: 6.4 g/dL (ref 6.0–8.5)

## 2019-10-01 LAB — CYTOLOGY - PAP
Comment: NEGATIVE
Diagnosis: NEGATIVE
High risk HPV: NEGATIVE

## 2019-10-01 LAB — LIPID PANEL
Chol/HDL Ratio: 3.9 ratio (ref 0.0–4.4)
Cholesterol, Total: 213 mg/dL — ABNORMAL HIGH (ref 100–199)
HDL: 55 mg/dL (ref >39)
LDL Chol Calc (NIH): 137 mg/dL — ABNORMAL HIGH (ref 0–99)
Triglycerides: 117 mg/dL (ref 0–149)
VLDL Cholesterol Cal: 21 mg/dL (ref 5–40)

## 2019-10-01 LAB — HEMOGLOBIN A1C
Est. average glucose Bld gHb Est-mCnc: 111 mg/dL
Hgb A1c MFr Bld: 5.5 % (ref 4.8–5.6)

## 2019-10-01 LAB — VITAMIN D 25 HYDROXY (VIT D DEFICIENCY, FRACTURES): Vit D, 25-Hydroxy: 32.7 ng/mL (ref 30.0–100.0)

## 2019-10-16 ENCOUNTER — Other Ambulatory Visit: Payer: Self-pay | Admitting: Family Medicine

## 2019-10-16 NOTE — Telephone Encounter (Signed)
Yolanda Hernandez, This refill came to me for Prolia, Please advise if this can be done

## 2019-10-28 ENCOUNTER — Encounter: Payer: Self-pay | Admitting: Family Medicine

## 2019-10-29 ENCOUNTER — Telehealth: Payer: Self-pay | Admitting: Family Medicine

## 2019-10-29 NOTE — Telephone Encounter (Signed)
Copied from [place] [id number]. Topic: General - Other >> Oct 29, 2019 12:14 PM Keene Breath wrote: Reason for CRM: Patient would like to get a copy of her bone density test results.  On My Chart it only shows the date test was given.  CB# [phone]

## 2019-10-31 NOTE — Telephone Encounter (Signed)
Mailed letter to patient

## 2019-11-05 ENCOUNTER — Telehealth: Payer: Self-pay | Admitting: Family Medicine

## 2019-11-05 NOTE — Telephone Encounter (Signed)
Pt states that she is due for Prolia at the end of February. She changed to [facility] Medicare 10/17/2019. I updated her insurance info but we don't have the card. Pt said she wanted to get the auth started.   Also, can you get prolia at the same time as COVID-19 vaccine?  Ph# [phone]

## 2019-11-10 NOTE — Telephone Encounter (Signed)
Waiting on summary of benefits to come back. I have updated her insurance information in the portal.

## 2019-12-23 ENCOUNTER — Other Ambulatory Visit: Payer: Self-pay

## 2019-12-23 ENCOUNTER — Ambulatory Visit (INDEPENDENT_AMBULATORY_CARE_PROVIDER_SITE_OTHER): Payer: Medicare Other

## 2019-12-23 DIAGNOSIS — M810 Age-related osteoporosis without current pathological fracture: Secondary | ICD-10-CM | POA: Diagnosis not present

## 2019-12-23 MED ORDER — DENOSUMAB 60 MG/ML ~~LOC~~ SOSY
60.0000 mg | PREFILLED_SYRINGE | Freq: Once | SUBCUTANEOUS | Status: AC
  Administered 2019-12-23: 60 mg via SUBCUTANEOUS

## 2019-12-23 NOTE — Progress Notes (Addendum)
Patient here for prolia injection. 1 mL given in left arm SQ. Patient tolerated well. Next injection due 06/25/2020  Nursing note reviewed. Agree with documention and plan.

## 2020-01-13 ENCOUNTER — Encounter: Payer: Self-pay | Admitting: Obstetrics and Gynecology

## 2020-01-13 ENCOUNTER — Encounter: Payer: Self-pay | Admitting: Family Medicine

## 2020-05-27 ENCOUNTER — Telehealth: Payer: Self-pay | Admitting: Family Medicine

## 2020-05-27 NOTE — Telephone Encounter (Signed)
Caller: Rahea  Call Back # [phone]  Per [facility] Rep, Rahea  Her office  will ship PROLIA to office our  Wednesday  06/02/2020.

## 2020-06-17 LAB — HM MAMMOGRAPHY

## 2020-06-29 ENCOUNTER — Ambulatory Visit (INDEPENDENT_AMBULATORY_CARE_PROVIDER_SITE_OTHER): Payer: Medicare Other

## 2020-06-29 ENCOUNTER — Other Ambulatory Visit: Payer: Self-pay

## 2020-06-29 DIAGNOSIS — M810 Age-related osteoporosis without current pathological fracture: Secondary | ICD-10-CM | POA: Diagnosis not present

## 2020-06-29 MED ORDER — DENOSUMAB 60 MG/ML ~~LOC~~ SOSY
60.0000 mg | PREFILLED_SYRINGE | Freq: Once | SUBCUTANEOUS | Status: AC
  Administered 2020-06-29: 60 mg via SUBCUTANEOUS

## 2020-06-29 NOTE — Progress Notes (Signed)
Patient here for Prolia injection. 1 mL given in left arm SQ. Patient tolerated well.   Next dose due 12/29/2019

## 2020-07-16 ENCOUNTER — Ambulatory Visit (INDEPENDENT_AMBULATORY_CARE_PROVIDER_SITE_OTHER): Payer: Medicare Other

## 2020-07-16 ENCOUNTER — Other Ambulatory Visit: Payer: Self-pay

## 2020-07-16 DIAGNOSIS — Z23 Encounter for immunization: Secondary | ICD-10-CM | POA: Diagnosis not present

## 2020-09-29 NOTE — Progress Notes (Signed)
64 y.o. Yolanda Hernandez Married White or Caucasian Not Hispanic or Latino female here for annual exam.   No vaginal bleeding. Sexually active, no penetration.   Being followed for possible early macular degeneration.   No LMP recorded. Patient is postmenopausal.          Sexually active: Yes.    The current method of family planning is post menopausal status.    Exercising: Yes.    walking and strength training  Smoker:  no  Health Maintenance: Pap: 12/15/20WNL HR HPV Neg,08/22/2018 positive HPV neg 16/18/45 History of abnormal Pap:  Yes in her 53's had cryosurgery MMG:  06/17/20 density C Bi-rads 1 normal  BMD:   09/30/19 osteoporosis  Colonoscopy: 05/30/2017 polyp, repeat in 5 years TDaP:  08/22/18 Gardasil: NA   reports that she has never smoked. She has never used smokeless tobacco. She reports current alcohol use. She reports that she does not use drugs. Just occasional ETOH. She is a retired Electrical engineer, working part time. Husband has sarcoidosis.   Past Medical History:  Diagnosis Date  . Cerumen impaction    chronic  . Cervical cancer screening 03/12/2015   Menarche at 10 Regular and moderate flow  history of abnormal pap in past, bx and cryo once many years ago, normal since. Last 2011 and normal G0P0, s/p No history of abnormal MGM Last in 2015, no breast concerns  No concerns today Biopsy, cervical: gyn surgeries Menopause at 30 ish  . Dysmenorrhea   . Hematuria 07/31/2017  . Hyperglycemia 03/19/2013  . Hypertension   . Osteopenia   . Osteoporosis 04/11/2010   Qualifier: Diagnosis of  By: Wynona Luna   . Other and unspecified hyperlipidemia 03/19/2013  . Skin cancer   . Skin cancer 07/14/2016  . Uterine fibroid     Past Surgical History:  Procedure Laterality Date  . BREAST SURGERY     biopsy left  . COLPOSCOPY    . GYNECOLOGIC CRYOSURGERY    . MYRINGOPLASTY  1972  . TONSILLECTOMY      Current Outpatient Medications  Medication Sig Dispense Refill  .  Calcium-Vitamin D-Vitamin K (CALCIUM SOFT CHEWS PO) Take 1 each by mouth daily.    . Multiple Vitamins-Minerals (PRESERVISION AREDS 2) CHEW     . Probiotic Product (PROBIOTIC PO) Take by mouth.    . PROLIA 60 MG/ML SOSY injection INJECT 60MG  SUBCUTANEOUSLY  EVERY 6 MONTHS (GIVEN AT MD OFFICE) 1 mL 0  . tretinoin (RETIN-A) 0.05 % cream Apply topically at bedtime. 45 g 3   No current facility-administered medications for this visit.    Family History  Problem Relation Age of Onset  . Osteoporosis Mother   . Hyperlipidemia Mother   . Hypertension Mother   . Heart disease Father   . Diabetes Father   . Hyperlipidemia Father   . Hypertension Father   . Diabetes Sister   . Heart disease Sister        s/p cardiac stent  . Hyperlipidemia Sister   . Hypertension Sister   . Osteoporosis Sister   . Kidney disease Other   . Alcohol abuse Other   . Colon cancer Neg Hx     Review of Systems  All other systems reviewed and are negative.   Exam:   BP 130/62   Pulse 87   Ht 5' 2.5" (1.588 m)   Wt 130 lb (59 kg)   SpO2 100%   BMI 23.40 kg/m   Weight change: @WEIGHTCHANGE @ Height:  Height: 5' 2.5" (158.8 cm)  Ht Readings from Last 3 Encounters:  09/30/20 5' 2.5" (1.588 m)  09/30/19 5' 2.21" (1.58 m)  09/18/18 5\' 3"  (1.6 m)    General appearance: alert, cooperative and appears stated age Head: Normocephalic, without obvious abnormality, atraumatic Neck: no adenopathy, supple, symmetrical, trachea midline and thyroid normal to inspection and palpation Breasts: normal appearance, no masses or tenderness Abdomen: soft, non-tender; non distended,  no masses,  no organomegaly Extremities: extremities normal, atraumatic, no cyanosis or edema Skin: Skin color, texture, turgor normal. No rashes or lesions Lymph nodes: Cervical, supraclavicular, and axillary nodes normal. No abnormal inguinal nodes palpated Neurologic: Grossly normal   Pelvic: External genitalia:  no lesions               Urethra:  normal appearing urethra with no masses, tenderness or lesions              Bartholins and Skenes: normal                 Vagina: normal appearing vagina with normal color and discharge, no lesions              Cervix: no lesions and anterior in her vagina               Bimanual Exam:  Uterus:  retroverted, ~8 week sized (stable), not tender              Adnexa: no mass, fullness, tenderness               Rectovaginal: Confirms               Anus:  normal sphincter tone, no lesions  Gae Dry chaperoned for the exam.  A:  GYN exam  H/O HPV in 2019  P:   Pap with hpv  Mammogram UTD  Colonoscopy UTD  DEXA in 12/22  Discussed breast self exam  Discussed calcium and vit D intake  F/U in 2 years, sooner with a problem

## 2020-09-30 ENCOUNTER — Encounter: Payer: Self-pay | Admitting: Obstetrics and Gynecology

## 2020-09-30 ENCOUNTER — Other Ambulatory Visit: Payer: Self-pay

## 2020-09-30 ENCOUNTER — Other Ambulatory Visit: Payer: Self-pay | Admitting: Family Medicine

## 2020-09-30 ENCOUNTER — Ambulatory Visit (INDEPENDENT_AMBULATORY_CARE_PROVIDER_SITE_OTHER): Payer: Medicare Other | Admitting: Obstetrics and Gynecology

## 2020-09-30 ENCOUNTER — Other Ambulatory Visit (HOSPITAL_COMMUNITY)
Admission: RE | Admit: 2020-09-30 | Discharge: 2020-09-30 | Disposition: A | Discharge status: Home / Self Care | Payer: Medicare Other | Source: Ambulatory Visit | Attending: Obstetrics and Gynecology | Admitting: Obstetrics and Gynecology

## 2020-09-30 VITALS — BP 130/62 | HR 87 | Ht 62.5 in | Wt 130.0 lb

## 2020-09-30 DIAGNOSIS — Z01419 Encounter for gynecological examination (general) (routine) without abnormal findings: Secondary | ICD-10-CM

## 2020-09-30 DIAGNOSIS — Z124 Encounter for screening for malignant neoplasm of cervix: Secondary | ICD-10-CM

## 2020-09-30 DIAGNOSIS — Z1151 Encounter for screening for human papillomavirus (HPV): Secondary | ICD-10-CM | POA: Diagnosis not present

## 2020-09-30 NOTE — Patient Instructions (Signed)

## 2020-10-01 LAB — CYTOLOGY - PAP
Comment: NEGATIVE
Diagnosis: NEGATIVE
High risk HPV: NEGATIVE

## 2020-11-19 ENCOUNTER — Telehealth: Payer: Self-pay | Admitting: Family Medicine

## 2020-11-19 NOTE — Telephone Encounter (Signed)
Patient states she needs prior authorization for her medicine   PROLIA 60 MG/ML SOSY injection [[id number]]    please advice

## 2020-11-20 ENCOUNTER — Encounter: Payer: Self-pay | Admitting: Family Medicine

## 2020-11-22 NOTE — Telephone Encounter (Signed)
Pt scheduled  

## 2020-11-23 MED ORDER — PROLIA 60 MG/ML ~~LOC~~ SOSY: PREFILLED_SYRINGE | SUBCUTANEOUS | 1 refills | Status: DC

## 2020-11-23 NOTE — Telephone Encounter (Signed)
Spoke with [facility] to have injection mailed here. They are waiting on patient to provide consent to have it delivered here. Will call back tomorrow.

## 2020-11-25 NOTE — Telephone Encounter (Signed)
Authorization given. Injection set to be delivered by 2/15. Sent patient mychart message to schedule nv.

## 2020-12-28 ENCOUNTER — Telehealth: Payer: Self-pay

## 2020-12-28 ENCOUNTER — Other Ambulatory Visit: Payer: Self-pay

## 2020-12-28 ENCOUNTER — Ambulatory Visit (INDEPENDENT_AMBULATORY_CARE_PROVIDER_SITE_OTHER): Payer: Medicare Other

## 2020-12-28 DIAGNOSIS — M810 Age-related osteoporosis without current pathological fracture: Secondary | ICD-10-CM | POA: Diagnosis not present

## 2020-12-28 MED ORDER — DENOSUMAB 60 MG/ML ~~LOC~~ SOSY
60.0000 mg | PREFILLED_SYRINGE | Freq: Once | SUBCUTANEOUS | Status: AC
  Administered 2020-12-28: 60 mg via SUBCUTANEOUS

## 2020-12-28 NOTE — Progress Notes (Signed)
Yolanda Hernandez is a 65 y.o. female presents to the office today for Prolia injection, per physician's orders. Original order: 11/21/2017: Dr Charlett Blake. Prolia 60 mg administered SQ: in left arm. Patient tolerated injection. Patient due for follow up labs/provider appt: 03/22/21 - appt already scheduled.  Patient next injection due: in 6 months:   Creft, [name], RMA

## 2020-12-28 NOTE — Telephone Encounter (Signed)
Prolia portal updated.

## 2020-12-28 NOTE — Telephone Encounter (Signed)
Pt was in for nurse visit today and received her Prolia injection. Pt tolerated well.

## 2021-01-18 ENCOUNTER — Encounter: Payer: Self-pay | Admitting: *Deleted

## 2021-03-22 ENCOUNTER — Ambulatory Visit (INDEPENDENT_AMBULATORY_CARE_PROVIDER_SITE_OTHER): Payer: Medicare Other | Admitting: Family Medicine

## 2021-03-22 ENCOUNTER — Other Ambulatory Visit: Payer: Self-pay

## 2021-03-22 ENCOUNTER — Encounter: Payer: Self-pay | Admitting: Family Medicine

## 2021-03-22 VITALS — BP 112/64 | HR 76 | Temp 98.1°F | Resp 16 | Ht 63.0 in | Wt 126.8 lb

## 2021-03-22 DIAGNOSIS — R252 Cramp and spasm: Secondary | ICD-10-CM | POA: Diagnosis not present

## 2021-03-22 DIAGNOSIS — Z639 Problem related to primary support group, unspecified: Secondary | ICD-10-CM

## 2021-03-22 DIAGNOSIS — M810 Age-related osteoporosis without current pathological fracture: Secondary | ICD-10-CM | POA: Diagnosis not present

## 2021-03-22 DIAGNOSIS — E782 Mixed hyperlipidemia: Secondary | ICD-10-CM

## 2021-03-22 DIAGNOSIS — Z0000 Encounter for general adult medical examination without abnormal findings: Secondary | ICD-10-CM

## 2021-03-22 DIAGNOSIS — Z1231 Encounter for screening mammogram for malignant neoplasm of breast: Secondary | ICD-10-CM | POA: Diagnosis not present

## 2021-03-22 DIAGNOSIS — M79661 Pain in right lower leg: Secondary | ICD-10-CM | POA: Insufficient documentation

## 2021-03-22 DIAGNOSIS — R011 Cardiac murmur, unspecified: Secondary | ICD-10-CM

## 2021-03-22 DIAGNOSIS — R739 Hyperglycemia, unspecified: Secondary | ICD-10-CM | POA: Diagnosis not present

## 2021-03-22 DIAGNOSIS — Z85828 Personal history of other malignant neoplasm of skin: Secondary | ICD-10-CM | POA: Diagnosis not present

## 2021-03-22 DIAGNOSIS — R319 Hematuria, unspecified: Secondary | ICD-10-CM

## 2021-03-22 HISTORY — DX: Pain in right lower leg: M79.661

## 2021-03-22 MED ORDER — TRETINOIN 0.05 % EX CREA: TOPICAL_CREAM | Freq: Every day | CUTANEOUS | 5 refills | Status: DC

## 2021-03-22 NOTE — Assessment & Plan Note (Signed)
hgba1c acceptable, minimize simple carbs. Increase exercise as tolerated.  

## 2021-03-22 NOTE — Assessment & Plan Note (Signed)
Follows with [facility], doing well

## 2021-03-22 NOTE — Assessment & Plan Note (Signed)
Encouraged heart healthy diet, increase exercise, avoid trans fats, consider a krill oil cap daily 

## 2021-03-22 NOTE — Assessment & Plan Note (Signed)
Check urinalysis. 

## 2021-03-22 NOTE — Patient Instructions (Signed)
Preventive Care 66 Years and Older, Female Preventive care refers to lifestyle choices and visits with your health care provider that can promote health and wellness. This includes:  A yearly physical exam. This is also called an annual wellness visit.  Regular dental and eye exams.  Immunizations.  Screening for certain conditions.  Healthy lifestyle choices, such as: ? Eating a healthy diet. ? Getting regular exercise. ? Not using drugs or products that contain nicotine and tobacco. ? Limiting alcohol use. What can I expect for my preventive care visit? Physical exam Your health care provider will check your:  Height and weight. These may be used to calculate your BMI (body mass index). BMI is a measurement that tells if you are at a healthy weight.  Heart rate and blood pressure.  Body temperature.  Skin for abnormal spots. Counseling Your health care provider may ask you questions about your:  Past medical problems.  Family's medical history.  Alcohol, tobacco, and drug use.  Emotional well-being.  Home life and relationship well-being.  Sexual activity.  Diet, exercise, and sleep habits.  History of falls.  Memory and ability to understand (cognition).  Work and work Statistician.  Pregnancy and menstrual history.  Access to firearms. What immunizations do I need? Vaccines are usually given at various ages, according to a schedule. Your health care provider will recommend vaccines for you based on your age, medical history, and lifestyle or other factors, such as travel or where you work.   What tests do I need? Blood tests  Lipid and cholesterol levels. These may be checked every 5 years, or more often depending on your overall health.  Hepatitis C test.  Hepatitis B test. Screening  Lung cancer screening. You may have this screening every year starting at age 66 if you have a 30-pack-year history of smoking and currently smoke or have quit within  the past 15 years.  Colorectal cancer screening. ? All adults should have this screening starting at age 66 and continuing until age 58. ? Your health care provider may recommend screening at age 2 if you are at increased risk. ? You will have tests every 1-10 years, depending on your results and the type of screening test.  Diabetes screening. ? This is done by checking your blood sugar (glucose) after you have not eaten for a while (fasting). ? You may have this done every 1-3 years.  Mammogram. ? This may be done every 1-2 years. ? Talk with your health care provider about how often you should have regular mammograms.  Abdominal aortic aneurysm (AAA) screening. You may need this if you are a current or former smoker.  BRCA-related cancer screening. This may be done if you have a family history of breast, ovarian, tubal, or peritoneal cancers. Other tests  STD (sexually transmitted disease) testing, if you are at risk.  Bone density scan. This is done to screen for osteoporosis. You may have this done starting at age 66. Talk with your health care provider about your test results, treatment options, and if necessary, the need for more tests. Follow these instructions at home: Eating and drinking  Eat a diet that includes fresh fruits and vegetables, whole grains, lean protein, and low-fat dairy products. Limit your intake of foods with high amounts of sugar, saturated fats, and salt.  Take vitamin and mineral supplements as recommended by your health care provider.  Do not drink alcohol if your health care provider tells you not to drink.  If you drink alcohol: ? Limit how much you have to 0-1 drink a day. ? Be aware of how much alcohol is in your drink. In the [place] one drink equals one 12 oz bottle of beer (355 mL), one 5 oz glass of wine (148 mL), or one 1 oz glass of hard liquor (44 mL).   Lifestyle  Take daily care of your teeth and gums. Brush your teeth every morning  and night with fluoride toothpaste. Floss one time each day.  Stay active. Exercise for at least 30 minutes 5 or more days each week.  Do not use any products that contain nicotine or tobacco, such as cigarettes, e-cigarettes, and chewing tobacco. If you need help quitting, ask your health care provider.  Do not use drugs.  If you are sexually active, practice safe sex. Use a condom or other form of protection in order to prevent STIs (sexually transmitted infections).  Talk with your health care provider about taking a low-dose aspirin or statin.  Find healthy ways to cope with stress, such as: ? Meditation, yoga, or listening to music. ? Journaling. ? Talking to a trusted person. ? Spending time with friends and family. Safety  Always wear your seat belt while driving or riding in a vehicle.  Do not drive: ? If you have been drinking alcohol. Do not ride with someone who has been drinking. ? When you are tired or distracted. ? While texting.  Wear a helmet and other protective equipment during sports activities.  If you have firearms in your house, make sure you follow all gun safety procedures. What's next?  Visit your health care provider once a year for an annual wellness visit.  Ask your health care provider how often you should have your eyes and teeth checked.  Stay up to date on all vaccines. This information is not intended to replace advice given to you by your health care provider. Make sure you discuss any questions you have with your health care provider. Document Revised: 09/22/2020 Document Reviewed: 09/26/2018 Elsevier Patient Education  2021 [employer]  

## 2021-03-22 NOTE — Assessment & Plan Note (Addendum)
Encouraged to get adequate exercise, calcium and vitamin d intake. Prolia due in September

## 2021-03-22 NOTE — Progress Notes (Signed)
Patient ID: Yolanda Hernandez, female    DOB: 09/20/1956  Age: 65 y.o. MRN: [id number]    Subjective:  Subjective  HPI Yolanda Hernandez presents for comprehensive physical exam today and follow up on management of chronic concerns. She states that she has no recent illnesses or ED visits to report. She denies any chest pain, SOB, fever, abdominal pain, cough, chills, sore throat, dysuria, urinary incontinence, back pain, HA, or N/VD. She endorses having leafy greens and yogurt in her diet to help with her osteoporosis. She endorses going the gym more often for working out. She reports feeling episodes of  stabbing pain in right calf that is intermittent. She denies observing or feeling any swelling, redness, or pain after an episodes have subsided. She expresses interest in getting a referral for behavioral counseling due to family conflict.   Review of Systems  Constitutional:  Negative for chills, fatigue and fever.  HENT:  Negative for congestion, rhinorrhea, sinus pressure, sinus pain and sore throat.   Eyes:  Negative for pain.  Respiratory:  Negative for cough and shortness of breath.   Cardiovascular:  Negative for chest pain, palpitations and leg swelling.  Gastrointestinal:  Negative for abdominal pain, blood in stool, diarrhea, nausea and vomiting.  Genitourinary:  Negative for decreased urine volume, dysuria, flank pain, frequency, vaginal bleeding and vaginal discharge.  Musculoskeletal:  Negative for back pain.  Neurological:  Negative for headaches.   History Past Medical History:  Diagnosis Date   Cerumen impaction    chronic   Cervical cancer screening 03/12/2015   Menarche at 10 Regular and moderate flow  history of abnormal pap in past, bx and cryo once many years ago, normal since. Last 2011 and normal G0P0, s/p No history of abnormal MGM Last in 2015, no breast concerns  No concerns today Biopsy, cervical: gyn surgeries Menopause at 4 ish   Dysmenorrhea    Hematuria  07/31/2017   Hyperglycemia 03/19/2013   Hypertension    Osteopenia    Osteoporosis 04/11/2010   Qualifier: Diagnosis of  By: Wynona Luna    Other and unspecified hyperlipidemia 03/19/2013   Skin cancer    Skin cancer 07/14/2016   Uterine fibroid     She has a past surgical history that includes Breast surgery; Myringoplasty (1972); Tonsillectomy; Gynecologic cryosurgery; and Colposcopy.   Her family history includes Alcohol abuse in an other family member; Diabetes in her father and sister; Heart disease in her father and sister; Hyperlipidemia in her father, mother, and sister; Hypertension in her father, mother, and sister; Kidney disease in an other family member; Macular degeneration in her mother; Osteoporosis in her mother and sister.She reports that she has never smoked. She has never used smokeless tobacco. She reports current alcohol use. She reports that she does not use drugs.  Current Outpatient Medications on File Prior to Visit  Medication Sig Dispense Refill   Calcium-Vitamin D-Vitamin K (CALCIUM SOFT CHEWS PO) Take 1 each by mouth daily.     denosumab (PROLIA) 60 MG/ML SOSY injection INJECT 60MG  SUBCUTANEOUSLY  EVERY 6 MONTHS (GIVEN AT MD OFFICE) 1 mL 1   Multiple Vitamins-Minerals (PRESERVISION AREDS 2) CHEW      Probiotic Product (PROBIOTIC PO) Take by mouth.     No current facility-administered medications on file prior to visit.     Objective:  Objective  Physical Exam Constitutional:      General: She is not in acute distress.    Appearance: Normal appearance. She  is not ill-appearing or toxic-appearing.  HENT:     Head: Normocephalic and atraumatic.     Right Ear: Tympanic membrane, ear canal and external ear normal.     Left Ear: Tympanic membrane, ear canal and external ear normal.     Nose: No congestion or rhinorrhea.  Eyes:     Extraocular Movements: Extraocular movements intact.     Right eye: No nystagmus.     Left eye: No nystagmus.     Pupils:  Pupils are equal, round, and reactive to light.  Cardiovascular:     Rate and Rhythm: Normal rate and regular rhythm.     Pulses: Normal pulses.     Heart sounds: Murmur heard.  Systolic murmur is present with a grade of 1/6.  Pulmonary:     Effort: Pulmonary effort is normal. No respiratory distress.     Breath sounds: Normal breath sounds. No wheezing, rhonchi or rales.  Abdominal:     General: Bowel sounds are normal.     Palpations: Abdomen is soft. There is no mass.     Tenderness: no abdominal tenderness There is no guarding.     Hernia: No hernia is present.  Musculoskeletal:        General: Normal range of motion.     Cervical back: Normal range of motion and neck supple.  Skin:    General: Skin is warm and dry.  Neurological:     Mental Status: She is alert and oriented to person, place, and time.  Psychiatric:        Behavior: Behavior normal.   BP 112/64   Pulse 76   Temp 98.1 F (36.7 C)   Resp 16   Ht 5\' 3"  (1.6 m)   Wt 126 lb 12.8 oz (57.5 kg)   SpO2 98%   BMI 22.46 kg/m  Wt Readings from Last 3 Encounters:  03/22/21 126 lb 12.8 oz (57.5 kg)  09/30/20 130 lb (59 kg)  09/30/19 128 lb 3.2 oz (58.2 kg)     Lab Results  Component Value Date   WBC 7.0 03/22/2021   HGB 15.1 (H) 03/22/2021   HCT 44.8 03/22/2021   PLT 333.0 03/22/2021   GLUCOSE 88 03/22/2021   CHOL 224 (H) 03/22/2021   TRIG 172.0 (H) 03/22/2021   HDL 59.00 03/22/2021   LDLCALC 130 (H) 03/22/2021   ALT 15 03/22/2021   AST 16 03/22/2021   NA 141 03/22/2021   K 4.1 03/22/2021   CL 104 03/22/2021   CREATININE 0.78 03/22/2021   BUN 18 03/22/2021   CO2 30 03/22/2021   TSH 4.13 03/22/2021   HGBA1C 5.9 03/22/2021    No results found.   Assessment & Plan:  Plan    Meds ordered this encounter  Medications   tretinoin (RETIN-A) 0.05 % cream    Sig: Apply topically at bedtime.    Dispense:  45 g    Refill:  5    Problem List Items Addressed This Visit     Osteoporosis     Encouraged to get adequate exercise, calcium and vitamin d intake. Prolia due in September       Relevant Orders   DG Bone Density   SKIN CANCER, HX OF    Follows with [facility], doing well       Preventative health care - Primary    Patient encouraged to maintain heart healthy diet, regular exercise, adequate sleep. Consider daily probiotics. Take medications as prescribed. Labs ordered and reviewed.  Mgm ordered, colonoscopy last in 2018, repeat in 2023. Follows with gynecology, Dr Talbert Nan. Will request records.        Relevant Orders   Hemoglobin A1c (Completed)   CBC with Differential/Platelet (Completed)   Comprehensive metabolic panel (Completed)   Lipid panel (Completed)   TSH (Completed)   Vitamin D 1,25 dihydroxy (Completed)   Hyperlipidemia, mixed    Encouraged heart healthy diet, increase exercise, avoid trans fats, consider a krill oil cap daily.       Hyperglycemia    hgba1c acceptable, minimize simple carbs. Increase exercise as tolerated.        Hematuria    Check urinalysis       Relevant Orders   Urinalysis (Completed)   Right calf pain   Conflict between patient and family    Patient referred to [facility] for counseling       Relevant Orders   Ambulatory referral to [place] murmur    New, will check 2 d echo to evaluate       Relevant Orders   ECHOCARDIOGRAM COMPLETE   Muscle cramping   Relevant Orders   Magnesium (Completed)   Other Visit Diagnoses     Encounter for screening mammogram for malignant neoplasm of breast       Relevant Orders   MM 3D SCREEN BREAST BILATERAL       Follow-up: Return in about 1 year (around 03/22/2022), or 1 yr CPE and 6 month r/u, for annual exam.   I,David Hanna,acting as a scribe for Penni Homans, MD.,have documented all relevant documentation on the behalf of Penni Homans, MD,as directed by  Penni Homans, MD while in the presence of Penni Homans, MD.  I, Mosie Lukes, MD personally performed the services described in this documentation. All medical record entries made by the scribe were at my direction and in my presence. I have reviewed the chart and agree that the record reflects my personal performance and is accurate and complete

## 2021-03-23 LAB — URINALYSIS
Bilirubin Urine: NEGATIVE
Hgb urine dipstick: NEGATIVE
Ketones, ur: NEGATIVE
Leukocytes,Ua: NEGATIVE
Nitrite: NEGATIVE
Specific Gravity, Urine: 1.025 (ref 1.000–1.030)
Total Protein, Urine: NEGATIVE
Urine Glucose: NEGATIVE
Urobilinogen, UA: 0.2 (ref 0.0–1.0)
pH: 5.5 (ref 5.0–8.0)

## 2021-03-23 LAB — CBC WITH DIFFERENTIAL/PLATELET
Basophils Absolute: 0.1 10*3/uL (ref 0.0–0.1)
Basophils Relative: 1 % (ref 0.0–3.0)
Eosinophils Absolute: 0 10*3/uL (ref 0.0–0.7)
Eosinophils Relative: 0.4 % (ref 0.0–5.0)
HCT: 44.8 % (ref 36.0–46.0)
Hemoglobin: 15.1 g/dL — ABNORMAL HIGH (ref 12.0–15.0)
Lymphocytes Relative: 35.5 % (ref 12.0–46.0)
Lymphs Abs: 2.5 10*3/uL (ref 0.7–4.0)
MCHC: 33.6 g/dL (ref 30.0–36.0)
MCV: 92.8 fl (ref 78.0–100.0)
Monocytes Absolute: 0.5 10*3/uL (ref 0.1–1.0)
Monocytes Relative: 6.8 % (ref 3.0–12.0)
Neutro Abs: 3.9 10*3/uL (ref 1.4–7.7)
Neutrophils Relative %: 56.3 % (ref 43.0–77.0)
Platelets: 333 10*3/uL (ref 150.0–400.0)
RBC: 4.83 Mil/uL (ref 3.87–5.11)
RDW: 12.3 % (ref 11.5–15.5)
WBC: 7 10*3/uL (ref 4.0–10.5)

## 2021-03-23 LAB — COMPREHENSIVE METABOLIC PANEL
ALT: 15 U/L (ref 0–35)
AST: 16 U/L (ref 0–37)
Albumin: 4.4 g/dL (ref 3.5–5.2)
Alkaline Phosphatase: 45 U/L (ref 39–117)
BUN: 18 mg/dL (ref 6–23)
CO2: 30 mEq/L (ref 19–32)
Calcium: 9.7 mg/dL (ref 8.4–10.5)
Chloride: 104 mEq/L (ref 96–112)
Creatinine, Ser: 0.78 mg/dL (ref 0.40–1.20)
GFR: 79.15 mL/min (ref >60.00)
Glucose, Bld: 88 mg/dL (ref 70–99)
Potassium: 4.1 mEq/L (ref 3.5–5.1)
Sodium: 141 mEq/L (ref 135–145)
Total Bilirubin: 0.5 mg/dL (ref 0.2–1.2)
Total Protein: 6.6 g/dL (ref 6.0–8.3)

## 2021-03-23 LAB — LIPID PANEL
Cholesterol: 224 mg/dL — ABNORMAL HIGH (ref 0–200)
HDL: 59 mg/dL (ref >39.00)
LDL Cholesterol: 130 mg/dL — ABNORMAL HIGH (ref 0–99)
NonHDL: 164.81
Total CHOL/HDL Ratio: 4
Triglycerides: 172 mg/dL — ABNORMAL HIGH (ref 0.0–149.0)
VLDL: 34.4 mg/dL (ref 0.0–40.0)

## 2021-03-23 LAB — HEMOGLOBIN A1C: Hgb A1c MFr Bld: 5.9 % (ref 4.6–6.5)

## 2021-03-23 LAB — TSH: TSH: 4.13 u[IU]/mL (ref 0.35–4.50)

## 2021-03-23 LAB — MAGNESIUM: Magnesium: 2 mg/dL (ref 1.5–2.5)

## 2021-03-25 LAB — VITAMIN D 1,25 DIHYDROXY
Vitamin D 1, 25 (OH)2 Total: 56 pg/mL (ref 18–72)
Vitamin D2 1, 25 (OH)2: <8 pg/mL
Vitamin D3 1, 25 (OH)2: 56 pg/mL

## 2021-03-26 DIAGNOSIS — Z639 Problem related to primary support group, unspecified: Secondary | ICD-10-CM

## 2021-03-26 DIAGNOSIS — R011 Cardiac murmur, unspecified: Secondary | ICD-10-CM

## 2021-03-26 DIAGNOSIS — R252 Cramp and spasm: Secondary | ICD-10-CM | POA: Insufficient documentation

## 2021-03-26 HISTORY — DX: Cramp and spasm: R25.2

## 2021-03-26 HISTORY — DX: Cardiac murmur, unspecified: R01.1

## 2021-03-26 HISTORY — DX: Problem related to primary support group, unspecified: Z63.9

## 2021-03-26 NOTE — Assessment & Plan Note (Addendum)
Patient encouraged to maintain heart healthy diet, regular exercise, adequate sleep. Consider daily probiotics. Take medications as prescribed. Labs ordered and reviewed. Mgm ordered, colonoscopy last in 2018, repeat in 2023. Follows with gynecology, Dr Talbert Nan. Will request records.

## 2021-03-26 NOTE — Assessment & Plan Note (Signed)
New, will check 2 d echo to evaluate

## 2021-03-26 NOTE — Assessment & Plan Note (Signed)
Patient referred to [facility] behavioral health for counseling

## 2021-04-20 ENCOUNTER — Other Ambulatory Visit: Payer: Self-pay

## 2021-04-20 ENCOUNTER — Other Ambulatory Visit: Payer: Self-pay | Admitting: Family Medicine

## 2021-04-20 ENCOUNTER — Ambulatory Visit (HOSPITAL_BASED_OUTPATIENT_CLINIC_OR_DEPARTMENT_OTHER)
Admission: RE | Admit: 2021-04-20 | Discharge: 2021-04-20 | Disposition: A | Discharge status: Home / Self Care | Payer: Medicare Other | Source: Ambulatory Visit | Attending: Family Medicine | Admitting: Family Medicine

## 2021-04-20 DIAGNOSIS — R011 Cardiac murmur, unspecified: Secondary | ICD-10-CM | POA: Insufficient documentation

## 2021-04-20 DIAGNOSIS — I351 Nonrheumatic aortic (valve) insufficiency: Secondary | ICD-10-CM

## 2021-04-20 LAB — ECHOCARDIOGRAM COMPLETE
AR max vel: 3.16 cm2
AV Area VTI: 3.14 cm2
AV Area mean vel: 2.96 cm2
AV Mean grad: 4 mmHg
AV Peak grad: 8.1 mmHg
AV Vena cont: 0.36 cm
Ao pk vel: 1.42 m/s
Area-P 1/2: 3.77 cm2
Calc EF: 69 %
MV M vel: 6.09 m/s
MV Peak grad: 148.4 mmHg
P 1/2 time: 270 msec
S' Lateral: 1.97 cm
Single Plane A2C EF: 77.5 %
Single Plane A4C EF: 54.4 %

## 2021-04-20 NOTE — Progress Notes (Signed)
*  PRELIMINARY RESULTS* Echocardiogram 2D Echocardiogram has been performed.  Luisa Hart RDCS 04/20/2021, 8:48 AM

## 2021-04-27 ENCOUNTER — Ambulatory Visit: Payer: Medicare Other | Admitting: Psychology

## 2021-05-04 ENCOUNTER — Ambulatory Visit (INDEPENDENT_AMBULATORY_CARE_PROVIDER_SITE_OTHER): Payer: Medicare Other | Admitting: Psychology

## 2021-05-04 DIAGNOSIS — F411 Generalized anxiety disorder: Secondary | ICD-10-CM | POA: Diagnosis not present

## 2021-05-18 ENCOUNTER — Ambulatory Visit (INDEPENDENT_AMBULATORY_CARE_PROVIDER_SITE_OTHER): Payer: Medicare Other | Admitting: Psychology

## 2021-05-18 DIAGNOSIS — F411 Generalized anxiety disorder: Secondary | ICD-10-CM | POA: Diagnosis not present

## 2021-06-07 ENCOUNTER — Ambulatory Visit (INDEPENDENT_AMBULATORY_CARE_PROVIDER_SITE_OTHER): Payer: Medicare Other | Admitting: Psychology

## 2021-06-07 DIAGNOSIS — F411 Generalized anxiety disorder: Secondary | ICD-10-CM | POA: Diagnosis not present

## 2021-06-07 DIAGNOSIS — N946 Dysmenorrhea, unspecified: Secondary | ICD-10-CM | POA: Insufficient documentation

## 2021-06-07 DIAGNOSIS — H6120 Impacted cerumen, unspecified ear: Secondary | ICD-10-CM | POA: Insufficient documentation

## 2021-06-07 DIAGNOSIS — I10 Essential (primary) hypertension: Secondary | ICD-10-CM | POA: Insufficient documentation

## 2021-06-08 ENCOUNTER — Ambulatory Visit (INDEPENDENT_AMBULATORY_CARE_PROVIDER_SITE_OTHER): Payer: Medicare Other | Admitting: Cardiology

## 2021-06-08 ENCOUNTER — Encounter: Payer: Self-pay | Admitting: Cardiology

## 2021-06-08 ENCOUNTER — Other Ambulatory Visit: Payer: Self-pay

## 2021-06-08 VITALS — BP 148/68 | HR 87 | Ht 63.0 in | Wt 125.0 lb

## 2021-06-08 DIAGNOSIS — E785 Hyperlipidemia, unspecified: Secondary | ICD-10-CM | POA: Diagnosis not present

## 2021-06-08 DIAGNOSIS — I7121 Aneurysm of the ascending aorta, without rupture: Secondary | ICD-10-CM | POA: Insufficient documentation

## 2021-06-08 DIAGNOSIS — I712 Thoracic aortic aneurysm, without rupture: Secondary | ICD-10-CM | POA: Diagnosis not present

## 2021-06-08 DIAGNOSIS — I351 Nonrheumatic aortic (valve) insufficiency: Secondary | ICD-10-CM | POA: Diagnosis not present

## 2021-06-08 DIAGNOSIS — I10 Essential (primary) hypertension: Secondary | ICD-10-CM | POA: Diagnosis not present

## 2021-06-08 NOTE — Patient Instructions (Signed)
Medication Instructions:  Your physician recommends that you continue on your current medications as directed. Please refer to the Current Medication list given to you today.  *If you need a refill on your cardiac medications before your next appointment, please call your pharmacy*   Lab Work: None If you have labs (blood work) drawn today and your tests are completely normal, you will receive your results only by: [place] (if you have MyChart) OR A paper copy in the mail If you have any lab test that is abnormal or we need to change your treatment, we will call you to review the results.   Testing/Procedures: Non-Cardiac CT scanning, (CAT scanning), is a noninvasive, special x-ray that produces cross-sectional images of the body using x-rays and a computer. CT scans help physicians diagnose and treat medical conditions. For some CT exams, a contrast material is used to enhance visibility in the area of the body being studied. CT scans provide greater clarity and reveal more details than regular x-ray exams.  Your physician has requested that you have an abdominal aorta duplex. During this test, an ultrasound is used to evaluate the aorta. Allow 30 minutes for this exam. Do not eat after midnight the day before and avoid carbonated beverages    Follow-Up: At [facility], you and your health needs are our priority.  As part of our continuing mission to provide you with exceptional heart care, we have created designated Provider Care Teams.  These Care Teams include your primary Cardiologist (physician) and Advanced Practice Providers (APPs -  Physician Assistants and Nurse Practitioners) who all work together to provide you with the care you need, when you need it.  We recommend signing up for the patient portal called "MyChart".  Sign up information is provided on this After Visit Summary.  MyChart is used to connect with patients for Virtual Visits (Telemedicine).  Patients are  able to view lab/test results, encounter notes, upcoming appointments, etc.  Non-urgent messages can be sent to your provider as well.   To learn more about what you can do with MyChart, go to [email or web address].    Your next appointment:   6 month(s)  The format for your next appointment:   In Person  Provider:   Jenne Campus, MD   Other Instructions

## 2021-06-08 NOTE — Progress Notes (Signed)
g

## 2021-06-08 NOTE — Progress Notes (Signed)
Cardiology Consultation:    Date:  06/08/2021   ID:  MADISIN SHERFEY, DOB 07/14/1956, MRN [id number]  PCP:  Mosie Lukes, MD  Cardiologist:  Jenne Campus, MD   Referring MD: Mosie Lukes, MD   Chief Complaint  Patient presents with   Heart Murmur    Echo shows moderate to severe mitral regurgitation    History of Present Illness:    Yolanda Hernandez is a 65 y.o. female who is being seen today for the evaluation of aortic insufficiency at the request of Mosie Lukes, MD. past medical history significant for essential hypertension, well controlled, dyslipidemia not on any medications she went to regular follow-up for to her primary care physician.  She was find to have heart murmur, echocardiogram has been scheduled and she was find to have moderate to severe aortic insufficiency with normal left ventricle size normal function.  She comes to talk about it.  Also noted finding was enlargement of the ascending aorta measuring 39 mm.  Of course she is very concerned about it.  She is doing very well she exercised on the regular basis have no difficulty doing it.  I when asked her about her ability to do exercises compared to 5 years ago she said it is better because she is semiretired she got plenty of time to do things now.  Denies have any chest pain tightness squeezing pressure mid chest no swelling of lower extremities no proximal nocturnal dyspnea.  She does describe to some palpitation symptoms when she wakes up in the morning. He does have multiple family members with coronary artery disease her sister actually got stents before age of 68. She does not smoke never did She is not on any special diet but exercise on the regular basis  Past Medical History:  Diagnosis Date   Cerumen impaction    chronic   Cervical cancer screening 03/12/2015   Menarche at 10 Regular and moderate flow  history of abnormal pap in past, bx and cryo once many years ago, normal since. Last 2011  and normal G0P0, s/p No history of abnormal MGM Last in 2015, no breast concerns  No concerns today Biopsy, cervical: gyn surgeries Menopause at 73 ish   Conflict between patient and family 03/26/2021   Dysmenorrhea    Heart murmur 03/26/2021   Hematuria 07/31/2017   Hyperglycemia 03/19/2013   Hypertension    Muscle cramping 03/26/2021   Osteopenia    Osteoporosis 04/11/2010   Qualifier: Diagnosis of  By: Wynona Luna    Other and unspecified hyperlipidemia 03/19/2013   Right calf pain 03/22/2021   Skin cancer    Skin cancer 07/14/2016   Uterine fibroid     Past Surgical History:  Procedure Laterality Date   BREAST SURGERY     biopsy left   COLPOSCOPY     GYNECOLOGIC CRYOSURGERY     MYRINGOPLASTY  1972   TONSILLECTOMY      Current Medications: Current Meds  Medication Sig   Calcium-Vitamin D-Vitamin K (CALCIUM SOFT CHEWS PO) Take 1 each by mouth daily. Unknown strength   denosumab (PROLIA) 60 MG/ML SOSY injection INJECT '60MG'$  SUBCUTANEOUSLY  EVERY 6 MONTHS (GIVEN AT MD OFFICE) (Patient taking differently: Inject 60 mg into the skin every 6 (six) months. INJECT '60MG'$  SUBCUTANEOUSLY  EVERY 6 MONTHS (GIVEN AT MD OFFICE))   Multiple Vitamins-Minerals (PRESERVISION AREDS 2) CHEW Chew 1 tablet by mouth daily. Unknown strength   Probiotic Product (PROBIOTIC PO) Take  1 tablet by mouth daily.   tretinoin (RETIN-A) 0.05 % cream Apply topically at bedtime. (Patient taking differently: Apply 1 tablet topically at bedtime.)     Allergies:   Iodine and Sulfa antibiotics   Social History   Socioeconomic History   Marital status: Married    Spouse name: Not on file   Number of children: Not on file   Years of education: Not on file   Highest education level: Not on file  Occupational History   Not on file  Tobacco Use   Smoking status: Never   Smokeless tobacco: Never  Vaping Use   Vaping Use: Never used  Substance and Sexual Activity   Alcohol use: Yes    Comment: 1 glass Wine every  other week per pt.   Drug use: No   Sexual activity: Yes    Birth control/protection: Post-menopausal    Comment: lives with husband, works at [employer], no dietary restricitons, exercises with strength training, elliptical etc  Other Topics Concern   Not on file  Social History Narrative   Not on file   Social Determinants of Health   Financial Resource Strain: Not on file  Food Insecurity: Not on file  Transportation Needs: Not on file  Physical Activity: Not on file  Stress: Not on file  Social Connections: Not on file     Family History: The patient's family history includes Alcohol abuse in an other family member; Diabetes in her father and sister; Heart disease in her father and sister; Hyperlipidemia in her father, mother, and sister; Hypertension in her father, mother, and sister; Kidney disease in an other family member; Macular degeneration in her mother; Osteoporosis in her mother and sister. There is no history of Colon cancer. ROS:   Please see the history of present illness.    All 14 point review of systems negative except as described per history of present illness.  EKGs/Labs/Other Studies Reviewed:    The following studies were reviewed today:   EKG:  EKG is  ordered today.  The ekg ordered today demonstrates normal sinus rhythm, normal P interval, normal QS complex duration morphology  Recent Labs: 03/22/2021: ALT 15; BUN 18; Creatinine, Ser 0.78; Hemoglobin 15.1; Magnesium 2.0; Platelets 333.0; Potassium 4.1; Sodium 141; TSH 4.13  Recent Lipid Panel    Component Value Date/Time   CHOL 224 (H) 03/22/2021 1557   CHOL 213 (H) 09/30/2019 0913   TRIG 172.0 (H) 03/22/2021 1557   HDL 59.00 03/22/2021 1557   HDL 55 09/30/2019 0913   CHOLHDL 4 03/22/2021 1557   VLDL 34.4 03/22/2021 1557   LDLCALC 130 (H) 03/22/2021 1557   LDLCALC 137 (H) 09/30/2019 0913    Physical Exam:    VS:  BP (!) 148/68 (BP Location: Right Arm, Patient Position: Sitting)    Pulse 87   Ht '5\' 3"'$  (1.6 m)   Wt 125 lb (56.7 kg)   SpO2 97%   BMI 22.14 kg/m     Wt Readings from Last 3 Encounters:  06/08/21 125 lb (56.7 kg)  03/22/21 126 lb 12.8 oz (57.5 kg)  09/30/20 130 lb (59 kg)     GEN:  Well nourished, well developed in no acute distress HEENT: Normal NECK: No JVD; No carotid bruits LYMPHATICS: No lymphadenopathy CARDIAC: RRR, n soft systolic murmur grade 1/6 to 2/6 best heard right upper portion of the sternum, there is also high diastolic murmur best heard left border of the sternum grade 2/6, no rubs, no gallops  RESPIRATORY:  Clear to auscultation without rales, wheezing or rhonchi  ABDOMEN: Soft, non-tender, non-distended MUSCULOSKELETAL:  No edema; No deformity  SKIN: Warm and dry NEUROLOGIC:  Alert and oriented x 3 PSYCHIATRIC:  Normal affect   ASSESSMENT:    1. Primary hypertension   2. Nonrheumatic aortic valve insufficiency   3. Ascending aortic aneurysm ([place])   4. Dyslipidemia    PLAN:    In order of problems listed above:  Aortic insufficiency which is moderate to severe likely she is completely asymptomatic, left ventricle is normal in size, ejection fraction still normal I did look personally at her echocardiogram and her ejection fraction is at the 60%.  Therefore no indications for intervention.  Obviously she need to have an echocardiogram either on the yearly basis or at the time she developed some symptoms.  I explained to her that eventually she may require surgical evaluation however now there is no indication for it.  I encouraged him to be active however I told her also avoid isovolumetric exercises Ascending aortic aneurysm measuring 39 mm.  I will ask her to have CT of her chest to make sure there is no other places for enlargement of the aorta at she will have abdominal aortic ultrasound to look at the abdominal aorta make sure there is no significant enlargement there. Dyslipidemia I did calculated her 10 years predicted  risk for coronary events is intermediate.  She will be scheduled to have CT of her chest to look for calcification if there is known we will avoid any medications.  On top of that she tells me her blood pressure is usually good today however blood pressure is elevated in the office but she said she was excited about seeing me. We did talk about healthy lifestyle need to exercise on the regular basis which she already does as well as good diet I recommended consider Mediterranean diet.   Medication Adjustments/Labs and Tests Ordered: Current medicines are reviewed at length with the patient today.  Concerns regarding medicines are outlined above.  No orders of the defined types were placed in this encounter.  No orders of the defined types were placed in this encounter.   Signed, Park Liter, MD, [facility]. 06/08/2021 2:25 PM    [facility]

## 2021-06-09 ENCOUNTER — Telehealth: Payer: Self-pay

## 2021-06-09 NOTE — Telephone Encounter (Signed)
[facility] called to schedule delivery for patient's prolia injection.   Delivery has been scheduled for tomorrow 06/10/2021 to be delivered.   Will notify patient once received.

## 2021-06-21 ENCOUNTER — Ambulatory Visit (INDEPENDENT_AMBULATORY_CARE_PROVIDER_SITE_OTHER): Payer: Medicare Other | Admitting: Psychology

## 2021-06-21 ENCOUNTER — Other Ambulatory Visit: Payer: Self-pay

## 2021-06-21 ENCOUNTER — Ambulatory Visit (HOSPITAL_BASED_OUTPATIENT_CLINIC_OR_DEPARTMENT_OTHER)
Admission: RE | Admit: 2021-06-21 | Discharge: 2021-06-21 | Disposition: A | Discharge status: Home / Self Care | Payer: Medicare Other | Source: Ambulatory Visit | Attending: Cardiology | Admitting: Cardiology

## 2021-06-21 DIAGNOSIS — F411 Generalized anxiety disorder: Secondary | ICD-10-CM

## 2021-06-21 DIAGNOSIS — I7121 Aneurysm of the ascending aorta, without rupture: Secondary | ICD-10-CM

## 2021-06-21 DIAGNOSIS — I712 Thoracic aortic aneurysm, without rupture: Secondary | ICD-10-CM | POA: Diagnosis present

## 2021-06-23 ENCOUNTER — Telehealth: Payer: Self-pay

## 2021-06-23 NOTE — Telephone Encounter (Signed)
Spoke with patient regarding results and recommendation.  Patient verbalizes understanding and is agreeable to plan of care. Advised patient to call back with any issues or concerns.  

## 2021-06-23 NOTE — Telephone Encounter (Signed)
-----   Message from Park Liter, MD sent at 06/23/2021  2:46 PM EDT ----- Ascending aorta mildly enlarged measuring 3.9 cm, but evidence of coronary artery calcification noted we will discuss during the visit

## 2021-06-28 ENCOUNTER — Ambulatory Visit (HOSPITAL_COMMUNITY)
Admission: RE | Admit: 2021-06-28 | Discharge: 2021-06-28 | Disposition: A | Discharge status: Home / Self Care | Payer: Medicare Other | Source: Ambulatory Visit | Attending: Internal Medicine | Admitting: Internal Medicine

## 2021-06-28 ENCOUNTER — Encounter: Payer: Self-pay | Admitting: Family Medicine

## 2021-06-28 ENCOUNTER — Other Ambulatory Visit: Payer: Self-pay

## 2021-06-28 DIAGNOSIS — I712 Thoracic aortic aneurysm, without rupture: Secondary | ICD-10-CM | POA: Diagnosis not present

## 2021-06-28 DIAGNOSIS — I7121 Aneurysm of the ascending aorta, without rupture: Secondary | ICD-10-CM

## 2021-06-30 ENCOUNTER — Encounter: Payer: Self-pay | Admitting: *Deleted

## 2021-07-01 ENCOUNTER — Ambulatory Visit (HOSPITAL_BASED_OUTPATIENT_CLINIC_OR_DEPARTMENT_OTHER): Payer: Medicare Other

## 2021-07-05 ENCOUNTER — Ambulatory Visit: Payer: Medicare Other | Admitting: Psychology

## 2021-07-13 ENCOUNTER — Other Ambulatory Visit: Payer: Self-pay

## 2021-07-13 ENCOUNTER — Ambulatory Visit (INDEPENDENT_AMBULATORY_CARE_PROVIDER_SITE_OTHER): Payer: Medicare Other

## 2021-07-13 DIAGNOSIS — M810 Age-related osteoporosis without current pathological fracture: Secondary | ICD-10-CM

## 2021-07-13 MED ORDER — DENOSUMAB 60 MG/ML ~~LOC~~ SOSY: 60.0000 mg | PREFILLED_SYRINGE | Freq: Once | SUBCUTANEOUS | 0 refills | Status: DC

## 2021-07-13 MED ORDER — DENOSUMAB 60 MG/ML ~~LOC~~ SOSY
60.0000 mg | PREFILLED_SYRINGE | Freq: Once | SUBCUTANEOUS | Status: AC
  Administered 2021-07-13: 60 mg via SUBCUTANEOUS

## 2021-07-13 NOTE — Progress Notes (Signed)
Yolanda Hernandez is a 65 y.o. female presents to the office today for Prolia injection, per physician's orders. Original order: 11/21/2017: Dr Charlett Blake. Prolia 60 mg administered SQ: in left arm. Patient tolerated injection.  Pt states she will call to schedule next injection

## 2021-07-14 LAB — HM MAMMOGRAPHY

## 2021-07-15 ENCOUNTER — Ambulatory Visit (INDEPENDENT_AMBULATORY_CARE_PROVIDER_SITE_OTHER): Payer: Medicare Other | Admitting: Psychology

## 2021-07-15 DIAGNOSIS — F411 Generalized anxiety disorder: Secondary | ICD-10-CM | POA: Diagnosis not present

## 2021-07-18 ENCOUNTER — Encounter: Payer: Self-pay | Admitting: *Deleted

## 2021-07-22 ENCOUNTER — Ambulatory Visit: Payer: Medicare Other

## 2021-07-27 ENCOUNTER — Ambulatory Visit (INDEPENDENT_AMBULATORY_CARE_PROVIDER_SITE_OTHER): Payer: Medicare Other

## 2021-07-27 ENCOUNTER — Other Ambulatory Visit: Payer: Self-pay

## 2021-07-27 DIAGNOSIS — Z23 Encounter for immunization: Secondary | ICD-10-CM

## 2021-07-28 ENCOUNTER — Ambulatory Visit (INDEPENDENT_AMBULATORY_CARE_PROVIDER_SITE_OTHER): Payer: Medicare Other | Admitting: Psychology

## 2021-07-28 DIAGNOSIS — F411 Generalized anxiety disorder: Secondary | ICD-10-CM | POA: Diagnosis not present

## 2021-08-04 ENCOUNTER — Ambulatory Visit: Payer: Medicare Other | Attending: Internal Medicine

## 2021-08-04 DIAGNOSIS — Z23 Encounter for immunization: Secondary | ICD-10-CM

## 2021-08-04 NOTE — Progress Notes (Signed)
   Covid-19 Vaccination Clinic  Name:  Yolanda Hernandez    MRN: [id number] DOB: [birth year]  08/04/2021  [name] was observed post Covid-19 immunization for 15 minutes without incident. She was provided with Vaccine Information Sheet and instruction to access the V-Safe system.   [name] was instructed to call 911 with any severe reactions post vaccine: Difficulty breathing  Swelling of face and throat  A fast heartbeat  A bad rash all over body  Dizziness and weakness   Immunizations Administered     Name Date Dose VIS Date Route   Pfizer Covid-19 Vaccine Bivalent Booster 08/04/2021  1:52 PM 0.3 mL 06/15/2021 Intramuscular   Manufacturer: [place]   Lot: [id number]   [place]: [phone]

## 2021-08-12 ENCOUNTER — Ambulatory Visit: Payer: Medicare Other | Admitting: Psychology

## 2021-08-16 ENCOUNTER — Ambulatory Visit (INDEPENDENT_AMBULATORY_CARE_PROVIDER_SITE_OTHER): Payer: Medicare Other | Admitting: Psychology

## 2021-08-16 DIAGNOSIS — F411 Generalized anxiety disorder: Secondary | ICD-10-CM | POA: Diagnosis not present

## 2021-08-30 ENCOUNTER — Other Ambulatory Visit (HOSPITAL_BASED_OUTPATIENT_CLINIC_OR_DEPARTMENT_OTHER): Payer: Self-pay

## 2021-08-30 MED ORDER — PFIZER COVID-19 VAC BIVALENT 30 MCG/0.3ML IM SUSP
INTRAMUSCULAR | 0 refills | Status: DC
  Filled 2021-08-30: qty 0.3, 1d supply, fill #0

## 2021-09-19 NOTE — Progress Notes (Signed)
Subjective:   Yolanda Hernandez is a 65 y.o. female who presents for an Initial Medicare Annual Wellness Visit.  Review of Systems     Cardiac Risk Factors include: advanced age (>26men, >55 women);hypertension;dyslipidemia     Objective:    Today's Vitals   09/20/21 0858  BP: 132/70  Pulse: 85  Resp: 16  Temp: 98.5 F (36.9 C)  TempSrc: Oral  SpO2: 97%  Weight: 126 lb 12.8 oz (57.5 kg)  Height: 5\' 3"  (1.6 m)   Body mass index is 22.46 kg/m.  Advanced Directives 09/20/2021 05/14/2017 03/12/2015  Does Patient Have a Medical Advance Directive? Yes Yes Yes  Type of Paramedic of [place];Living will [place];Living will [place];Living will  Does patient want to make changes to medical advance directive? - - Yes - information given  Copy of [place] in Chart? Yes - validated most recent copy scanned in chart (See row information) - No - copy requested    Current Medications (verified) Outpatient Encounter Medications as of 09/20/2021  Medication Sig   Calcium-Vitamin D-Vitamin K (CALCIUM SOFT CHEWS PO) Take 1 each by mouth daily. Unknown strength   denosumab (PROLIA) 60 MG/ML SOSY injection INJECT 60MG  SUBCUTANEOUSLY  EVERY 6 MONTHS (GIVEN AT MD OFFICE) (Patient taking differently: Inject 60 mg into the skin every 6 (six) months. INJECT 60MG  SUBCUTANEOUSLY  EVERY 6 MONTHS (GIVEN AT MD OFFICE))   Probiotic Product (PROBIOTIC PO) Take 1 tablet by mouth daily.   tretinoin (RETIN-A) 0.05 % cream Apply topically at bedtime. (Patient taking differently: Apply 1 tablet topically at bedtime.)   COVID-19 mRNA bivalent vaccine, Pfizer, (PFIZER COVID-19 VAC BIVALENT) injection Inject into the muscle. (Patient not taking: Reported on 09/20/2021)   Multiple Vitamins-Minerals (PRESERVISION AREDS 2) CHEW Chew 1 tablet by mouth daily. Unknown strength (Patient not taking: Reported on 09/20/2021)   No  facility-administered encounter medications on file as of 09/20/2021.    Allergies (verified) Iodine and Sulfa antibiotics   History: Past Medical History:  Diagnosis Date   Cerumen impaction    chronic   Cervical cancer screening 03/12/2015   Menarche at 10 Regular and moderate flow  history of abnormal pap in past, bx and cryo once many years ago, normal since. Last 2011 and normal G0P0, s/p No history of abnormal MGM Last in 2015, no breast concerns  No concerns today Biopsy, cervical: gyn surgeries Menopause at 7 ish   Conflict between patient and family 03/26/2021   Dysmenorrhea    Heart murmur 03/26/2021   Hematuria 07/31/2017   Hyperglycemia 03/19/2013   Hypertension    Muscle cramping 03/26/2021   Osteopenia    Osteoporosis 04/11/2010   Qualifier: Diagnosis of  By: Wynona Luna    Other and unspecified hyperlipidemia 03/19/2013   Right calf pain 03/22/2021   Skin cancer    Skin cancer 07/14/2016   Uterine fibroid    Past Surgical History:  Procedure Laterality Date   BREAST SURGERY     biopsy left   COLPOSCOPY     GYNECOLOGIC CRYOSURGERY     MYRINGOPLASTY  1972   TONSILLECTOMY     Family History  Problem Relation Age of Onset   Osteoporosis Mother    Hyperlipidemia Mother    Hypertension Mother    Macular degeneration Mother    Heart disease Father    Diabetes Father    Hyperlipidemia Father    Hypertension Father  Diabetes Sister    Heart disease Sister        s/p cardiac stent   Hyperlipidemia Sister    Hypertension Sister    Osteoporosis Sister    Kidney disease Other    Alcohol abuse Other    Colon cancer Neg Hx    Social History   Socioeconomic History   Marital status: Married    Spouse name: Not on file   Number of children: Not on file   Years of education: Not on file   Highest education level: Not on file  Occupational History   Not on file  Tobacco Use   Smoking status: Never   Smokeless tobacco: Never  Vaping Use   Vaping Use:  Never used  Substance and Sexual Activity   Alcohol use: Yes    Comment: 1 glass Wine every other week per pt.   Drug use: No   Sexual activity: Yes    Birth control/protection: Post-menopausal    Comment: lives with husband, works at Electrical engineer, no dietary restricitons, exercises with strength training, elliptical etc  Other Topics Concern   Not on file  Social History Narrative   Not on file   Social Determinants of Health   Financial Resource Strain: Low Risk    Difficulty of Paying Living Expenses: Not hard at all  Food Insecurity: No Food Insecurity   Worried About Charity fundraiser in the Last Year: Never true   Arboriculturist in the Last Year: Never true  Transportation Needs: No Transportation Needs   Lack of Transportation (Medical): No   Lack of Transportation (Non-Medical): No  Physical Activity: Sufficiently Active   Days of Exercise per Week: 6 days   Minutes of Exercise per Session: 60 min  Stress: No Stress Concern Present   Feeling of Stress : Not at all  Social Connections: Moderately Isolated   Frequency of Communication with Friends and Family: More than three times a week   Frequency of Social Gatherings with Friends and Family: More than three times a week   Attends Religious Services: Never   Marine scientist or Organizations: No   Attends Music therapist: Never   Marital Status: Married    Tobacco Counseling Counseling given: Not Answered   Clinical Intake:  Pre-visit preparation completed: Yes  Pain : No/denies pain     BMI - recorded: 22.46 Nutritional Status: BMI of 19-24  Normal Nutritional Risks: None Diabetes: No  How often do you need to have someone help you when you read instructions, pamphlets, or other written materials from your doctor or pharmacy?: 1 - Never  Diabetic?No  Interpreter Needed?: No  Information entered by :: Caroleen Hamman LPN   Activities of Daily Living In your present  state of health, do you have any difficulty performing the following activities: 09/20/2021 03/22/2021  Hearing? N N  Vision? N N  Difficulty concentrating or making decisions? N N  Walking or climbing stairs? N N  Dressing or bathing? N N  Doing errands, shopping? N N  Preparing Food and eating ? N -  Using the Toilet? N -  In the past six months, have you accidently leaked urine? N -  Do you have problems with loss of bowel control? N -  Managing your Medications? N -  Managing your Finances? N -  Housekeeping or managing your Housekeeping? N -  Some recent data might be hidden    Patient Care Team: Charlett Blake,  Bonnita Levan, MD as PCP - General (Family Medicine) Salvadore Dom, MD as Consulting Physician (Obstetrics and Gynecology)  Indicate any recent Medical Services you may have received from other than [facility] providers in the past year (date may be approximate).     Assessment:   This is a routine wellness examination for Amanda Park.  Hearing/Vision screen Hearing Screening - Comments:: Bilateral hearing aids Vision Screening - Comments:: Contact lenses Last eye exam-08/2020-Has an  appt in Feb 2023-Dr. Fulp  Dietary issues and exercise activities discussed: Current Exercise Habits: Home exercise routine;Structured exercise class, Type of exercise: walking, Time (Minutes): 60, Frequency (Times/Week): 6, Weekly Exercise (Minutes/Week): 360, Intensity: Moderate, Exercise limited by: None identified   Goals Addressed             This Visit's Progress    Patient Stated       Exercise more consistently       Depression Screen PHQ 2/9 Scores 09/20/2021 03/22/2021 08/22/2018 07/14/2016  PHQ - 2 Score 0 0 0 0  PHQ- 9 Score - - 1 -    Fall Risk Fall Risk  09/20/2021 03/22/2021 07/14/2016  Falls in the past year? 0 0 No  Number falls in past yr: 0 0 -  Injury with Fall? 0 0 -  Follow up Falls prevention discussed - -    FALL RISK PREVENTION PERTAINING TO THE HOME:  Any stairs in  or around the home? Yes  If so, are there any without handrails? No  Home free of loose throw rugs in walkways, pet beds, electrical cords, etc? Yes  Adequate lighting in your home to reduce risk of falls? Yes   ASSISTIVE DEVICES UTILIZED TO PREVENT FALLS:  Life alert? No  Use of a cane, walker or w/c? No  Grab bars in the bathroom? No  Shower chair or bench in shower? No  Elevated toilet seat or a handicapped toilet? No   TIMED UP AND GO:  Was the test performed? Yes .  Length of time to ambulate 10 feet: 10 sec.   Gait steady and fast without use of assistive device  Cognitive Function:Normal cognitive status assessed by direct observation by this Nurse Health Advisor. No abnormalities found.          Immunizations Immunization History  Administered Date(s) Administered   Fluad Quad(high Dose 65+) 07/16/2020, 07/27/2021   Influenza Inj Mdck Quad Pf 07/25/2019   Influenza,inj,Quad PF,6+ Mos 07/07/2013, 07/09/2015, 07/14/2016, 07/31/2017, 07/08/2018   PFIZER(Purple Top)SARS-COV-2 Vaccination 11/10/2019, 12/01/2019, 07/23/2020, 02/14/2021   Pfizer Covid-19 Vaccine Bivalent Booster 63yrs & up 08/04/2021   Pneumococcal Conjugate-13 03/12/2015   Pneumococcal Polysaccharide-23 07/14/2016   Td 10/14/2008   Tdap 08/22/2018   Zoster Recombinat (Shingrix) 12/11/2017, 02/12/2018   Zoster, Live 10/23/2014    TDAP status: Up to date  Flu Vaccine status: Up to date  Pneumococcal vaccine status: Up to date  Covid-19 vaccine status: Completed vaccines  Qualifies for Shingles Vaccine? No   Zostavax completed Yes   Shingrix Completed?: Yes  Screening Tests Health Maintenance  Topic Date Due   Pneumonia Vaccine 47+ Years old (43) 07/14/2021   COLONOSCOPY (Pts 45-60yrs Insurance coverage will need to be confirmed)  05/30/2022   MAMMOGRAM  07/15/2023   TETANUS/TDAP  08/22/2028   INFLUENZA VACCINE  Completed   DEXA SCAN  Completed   COVID-19 Vaccine  Completed   Hepatitis C  Screening  Completed   Zoster Vaccines- Shingrix  Completed   HPV VACCINES  Aged Out  Health Maintenance  Health Maintenance Due  Topic Date Due   Pneumonia Vaccine 72+ Years old (19) 07/14/2021    Colorectal cancer screening: Type of screening: Colonoscopy. Completed 06/02/2017. Repeat every 5 years  Mammogram status: Completed bilateral 07/14/2021. Repeat every year  Bone Density status: Scheduled for 10/05/2021  Lung Cancer Screening: (Low Dose CT Chest recommended if Age 3-80 years, 30 pack-year currently smoking OR have quit w/in 15years.) does not qualify.     Additional Screening:  Hepatitis C Screening: Completed 07/14/2016  Vision Screening: Recommended annual ophthalmology exams for early detection of glaucoma and other disorders of the eye. Is the patient up to date with their annual eye exam?  Yes  Who is the provider or what is the name of the office in which the patient attends annual eye exams? Dr. Herbert Deaner   Dental Screening: Recommended annual dental exams for proper oral hygiene  Community Resource Referral / Chronic Care Management: CRR required this visit?  No   CCM required this visit?  No      Plan:     I have personally reviewed and noted the following in the patient's chart:   Medical and social history Use of alcohol, tobacco or illicit drugs  Current medications and supplements including opioid prescriptions. Patient is not currently taking opioid prescriptions. Functional ability and status Nutritional status Physical activity Advanced directives List of other physicians Hospitalizations, surgeries, and ER visits in previous 12 months Vitals Screenings to include cognitive, depression, and falls Referrals and appointments  In addition, I have reviewed and discussed with patient certain preventive protocols, quality metrics, and best practice recommendations. A written personalized care plan for preventive services as well as general  preventive health recommendations were provided to patient.   Patient to access avs on mychart.  Marta Antu, LPN   [id number]  Nurse Health Advisor  Nurse Notes: None

## 2021-09-20 ENCOUNTER — Ambulatory Visit (INDEPENDENT_AMBULATORY_CARE_PROVIDER_SITE_OTHER): Payer: Medicare Other

## 2021-09-20 VITALS — BP 132/70 | HR 85 | Temp 98.5°F | Resp 16 | Ht 63.0 in | Wt 126.8 lb

## 2021-09-20 DIAGNOSIS — Z0000 Encounter for general adult medical examination without abnormal findings: Secondary | ICD-10-CM

## 2021-09-20 LAB — HM DEXA SCAN

## 2021-09-20 NOTE — Patient Instructions (Signed)
Yolanda Hernandez , Thank you for taking time to come for your Medicare Wellness Visit. I appreciate your ongoing commitment to your health goals. Please review the following plan we discussed and let me know if I can assist you in the future.   Screening recommendations/referrals: Colonoscopy: Completed 06/02/2017-Due-06/02/2022 Mammogram: Completed 07/14/2021-Due 07/14/2022 Bone Density: Scheduled for 10/05/2021 Recommended yearly ophthalmology/optometry visit for glaucoma screening and checkup Recommended yearly dental visit for hygiene and checkup  Vaccinations: Influenza vaccine: Up to date Pneumococcal vaccine: Discuss with Dr. Charlett Blake at next office visit to see when to repeat. Tdap vaccine: Up to date Shingles vaccine: Completed vaccines   Covid-19:Up to date  Advanced directives: Copy in chart  Conditions/risks identified: See problem list  Next appointment: Follow up in one year for your annual wellness visit 09/26/2022 @ 9:00   Preventive Care 66 Years and Older, Female Preventive care refers to lifestyle choices and visits with your health care provider that can promote health and wellness. What does preventive care include? A yearly physical exam. This is also called an annual well check. Dental exams once or twice a year. Routine eye exams. Ask your health care provider how often you should have your eyes checked. Personal lifestyle choices, including: Daily care of your teeth and gums. Regular physical activity. Eating a healthy diet. Avoiding tobacco and drug use. Limiting alcohol use. Practicing safe sex. Taking low-dose aspirin every day. Taking vitamin and mineral supplements as recommended by your health care provider. What happens during an annual well check? The services and screenings done by your health care provider during your annual well check will depend on your age, overall health, lifestyle risk factors, and family history of disease. Counseling  Your  health care provider may ask you questions about your: Alcohol use. Tobacco use. Drug use. Emotional well-being. Home and relationship well-being. Sexual activity. Eating habits. History of falls. Memory and ability to understand (cognition). Work and work Statistician. Reproductive health. Screening  You may have the following tests or measurements: Height, weight, and BMI. Blood pressure. Lipid and cholesterol levels. These may be checked every 5 years, or more frequently if you are over 31 years old. Skin check. Lung cancer screening. You may have this screening every year starting at age 66 if you have a 30-pack-year history of smoking and currently smoke or have quit within the past 15 years. Fecal occult blood test (FOBT) of the stool. You may have this test every year starting at age 66. Flexible sigmoidoscopy or colonoscopy. You may have a sigmoidoscopy every 5 years or a colonoscopy every 10 years starting at age 66. Hepatitis C blood test. Hepatitis B blood test. Sexually transmitted disease (STD) testing. Diabetes screening. This is done by checking your blood sugar (glucose) after you have not eaten for a while (fasting). You may have this done every 1-3 years. Bone density scan. This is done to screen for osteoporosis. You may have this done starting at age 66. Mammogram. This may be done every 1-2 years. Talk to your health care provider about how often you should have regular mammograms. Talk with your health care provider about your test results, treatment options, and if necessary, the need for more tests. Vaccines  Your health care provider may recommend certain vaccines, such as: Influenza vaccine. This is recommended every year. Tetanus, diphtheria, and acellular pertussis (Tdap, Td) vaccine. You may need a Td booster every 10 years. Zoster vaccine. You may need this after age 66. Pneumococcal 13-valent conjugate (PCV13) vaccine.  One dose is recommended after age  66. Pneumococcal polysaccharide (PPSV23) vaccine. One dose is recommended after age 66. Talk to your health care provider about which screenings and vaccines you need and how often you need them. This information is not intended to replace advice given to you by your health care provider. Make sure you discuss any questions you have with your health care provider. Document Released: 10/29/2015 Document Revised: 06/21/2016 Document Reviewed: 08/03/2015 Elsevier Interactive Patient Education  2017 [place] Prevention in the Home Falls can cause injuries. They can happen to people of all ages. There are many things you can do to make your home safe and to help prevent falls. What can I do on the outside of my home? Regularly fix the edges of walkways and driveways and fix any cracks. Remove anything that might make you trip as you walk through a door, such as a raised step or threshold. Trim any bushes or trees on the path to your home. Use bright outdoor lighting. Clear any walking paths of anything that might make someone trip, such as rocks or tools. Regularly check to see if handrails are loose or broken. Make sure that both sides of any steps have handrails. Any raised decks and porches should have guardrails on the edges. Have any leaves, snow, or ice cleared regularly. Use sand or salt on walking paths during winter. Clean up any spills in your garage right away. This includes oil or grease spills. What can I do in the bathroom? Use night lights. Install grab bars by the toilet and in the tub and shower. Do not use towel bars as grab bars. Use non-skid mats or decals in the tub or shower. If you need to sit down in the shower, use a plastic, non-slip stool. Keep the floor dry. Clean up any water that spills on the floor as soon as it happens. Remove soap buildup in the tub or shower regularly. Attach bath mats securely with double-sided non-slip rug tape. Do not have throw  rugs and other things on the floor that can make you trip. What can I do in the bedroom? Use night lights. Make sure that you have a light by your bed that is easy to reach. Do not use any sheets or blankets that are too big for your bed. They should not hang down onto the floor. Have a firm chair that has side arms. You can use this for support while you get dressed. Do not have throw rugs and other things on the floor that can make you trip. What can I do in the kitchen? Clean up any spills right away. Avoid walking on wet floors. Keep items that you use a lot in easy-to-reach places. If you need to reach something above you, use a strong step stool that has a grab bar. Keep electrical cords out of the way. Do not use floor polish or wax that makes floors slippery. If you must use wax, use non-skid floor wax. Do not have throw rugs and other things on the floor that can make you trip. What can I do with my stairs? Do not leave any items on the stairs. Make sure that there are handrails on both sides of the stairs and use them. Fix handrails that are broken or loose. Make sure that handrails are as long as the stairways. Check any carpeting to make sure that it is firmly attached to the stairs. Fix any carpet that is loose or  worn. Avoid having throw rugs at the top or bottom of the stairs. If you do have throw rugs, attach them to the floor with carpet tape. Make sure that you have a light switch at the top of the stairs and the bottom of the stairs. If you do not have them, ask someone to add them for you. What else can I do to help prevent falls? Wear shoes that: Do not have high heels. Have rubber bottoms. Are comfortable and fit you well. Are closed at the toe. Do not wear sandals. If you use a stepladder: Make sure that it is fully opened. Do not climb a closed stepladder. Make sure that both sides of the stepladder are locked into place. Ask someone to hold it for you, if  possible. Clearly mark and make sure that you can see: Any grab bars or handrails. First and last steps. Where the edge of each step is. Use tools that help you move around (mobility aids) if they are needed. These include: Canes. Walkers. Scooters. Crutches. Turn on the lights when you go into a dark area. Replace any light bulbs as soon as they burn out. Set up your furniture so you have a clear path. Avoid moving your furniture around. If any of your floors are uneven, fix them. If there are any pets around you, be aware of where they are. Review your medicines with your doctor. Some medicines can make you feel dizzy. This can increase your chance of falling. Ask your doctor what other things that you can do to help prevent falls. This information is not intended to replace advice given to you by your health care provider. Make sure you discuss any questions you have with your health care provider. Document Released: 07/29/2009 Document Revised: 03/09/2016 Document Reviewed: 11/06/2014 Elsevier Interactive Patient Education  2017 [employer].

## 2021-09-22 ENCOUNTER — Ambulatory Visit: Payer: Medicare Other | Admitting: Family Medicine

## 2021-09-24 ENCOUNTER — Encounter: Payer: Self-pay | Admitting: Family Medicine

## 2021-09-27 ENCOUNTER — Telehealth: Payer: Self-pay

## 2021-09-27 NOTE — Telephone Encounter (Signed)
Caller states received invoice and would like to discuss charges.  Caller Name [place] Phone Number [phone]

## 2021-09-27 NOTE — Telephone Encounter (Signed)
Spoke with patient and found out the person that had called here was from [facility] and she had called on behalf of patient because pt had called her insurance company regarding a refund she had received from [facility] and was wondering what it was for.  I reviewed patient's transaction information and found it to be in regards to Prolia from a March 2022 injection and advised patient to all Billing should she have further questions regarding the reason for the refund.

## 2021-10-05 LAB — HM DEXA SCAN

## 2021-10-26 ENCOUNTER — Ambulatory Visit: Payer: Medicare Other | Admitting: Obstetrics and Gynecology

## 2021-11-08 NOTE — Progress Notes (Signed)
66 y.o. Yolanda Hernandez Married White or Caucasian Not Hispanic or Latino female here for annual exam.  No vaginal bleeding. No dyspareunia.   No bowel or bladder c/o.   She is being evaluated for possible early macular degeneration, seeing her Optometrist for her routine check up next week.    She is seeing a Cardiologist for aortic insufficiency and enlargement of her ascending aorta. She does have some calcifications in her cardiac vessels (hasn't had cardiac CT).   No LMP recorded. Patient is postmenopausal.          Sexually active: Yes.    The current method of family planning is post menopausal status.    Exercising: Yes.     Walking, strength training Smoker:  no  Health Maintenance: Pap:  09-30-20 neg HPV HR neg, 09-30-19 neg HPV HR neg,08/22/2018 positive HPV, neg 16/18/45 History of abnormal Pap:  Yes MMG:  07/14/21 Bi-rads 1 neg  BMD:   09/20/21 Osteopenia, on prolia (with primary) Colonoscopy:05/30/2017 polyp, repeat in 5 years TDaP:  08/22/2018 Gardasil: N/A   reports that she has never smoked. She has never used smokeless tobacco. She reports current alcohol use. She reports that she does not use drugs. Drinks occasionally to rarely. She is a retired Electrical engineer, working part time. Husband has sarcoidosis.   Past Medical History:  Diagnosis Date   Cerumen impaction    chronic   Cervical cancer screening 03/12/2015   Menarche at 10 Regular and moderate flow  history of abnormal pap in past, bx and cryo once many years ago, normal since. Last 2011 and normal G0P0, s/p No history of abnormal MGM Last in 2015, no breast concerns  No concerns today Biopsy, cervical: gyn surgeries Menopause at 89 ish   Conflict between patient and family 03/26/2021   Dysmenorrhea    Heart murmur 03/26/2021   Hematuria 07/31/2017   HPV in female 2019   Hyperglycemia 03/19/2013   Hypertension    Muscle cramping 03/26/2021   Osteopenia    Osteoporosis 04/11/2010   Qualifier: Diagnosis of  By:  Wynona Luna    Other and unspecified hyperlipidemia 03/19/2013   Right calf pain 03/22/2021   Skin cancer    Skin cancer 07/14/2016   Uterine fibroid     Past Surgical History:  Procedure Laterality Date   BREAST SURGERY     biopsy left   COLPOSCOPY     GYNECOLOGIC CRYOSURGERY     MYRINGOPLASTY  1972   TONSILLECTOMY      Current Outpatient Medications  Medication Sig Dispense Refill   Calcium-Vitamin D-Vitamin K (CALCIUM SOFT CHEWS PO) Take 1 each by mouth daily. Unknown strength     chlorpheniramine (CHLOR-TRIMETON) 4 MG tablet Take 1 tablet (4 mg total) by mouth 2 (two) times daily as needed for allergies. 30 tablet 0   denosumab (PROLIA) 60 MG/ML SOSY injection INJECT 60MG  SUBCUTANEOUSLY  EVERY 6 MONTHS (GIVEN AT MD OFFICE) (Patient taking differently: Inject 60 mg into the skin every 6 (six) months. INJECT 60MG  SUBCUTANEOUSLY  EVERY 6 MONTHS (GIVEN AT MD OFFICE)) 1 mL 1   Probiotic Product (PROBIOTIC PO) Take 1 tablet by mouth daily.     tretinoin (RETIN-A) 0.05 % cream Apply topically at bedtime. (Patient taking differently: Apply 1 tablet topically at bedtime.) 45 g 5   No current facility-administered medications for this visit.    Family History  Problem Relation Age of Onset   Osteoporosis Mother    Hyperlipidemia Mother  Hypertension Mother    Macular degeneration Mother    Heart disease Father    Diabetes Father    Hyperlipidemia Father    Hypertension Father    Diabetes Sister    Heart disease Sister        s/p cardiac stent   Hyperlipidemia Sister    Hypertension Sister    Osteoporosis Sister    Kidney disease Other    Alcohol abuse Other    Colon cancer Neg Hx     Review of Systems  Constitutional: Negative.   HENT: Negative.    Eyes: Negative.   Respiratory: Negative.    Cardiovascular: Negative.   Gastrointestinal: Negative.   Endocrine: Negative.   Genitourinary: Negative.   Musculoskeletal: Negative.   Skin: Negative.    Allergic/Immunologic: Negative.   Neurological: Negative.   Hematological: Negative.   Psychiatric/Behavioral: Negative.     Exam:   BP 120/70    Pulse 98    Resp 16    Ht 5' 1.75" (1.568 m)    Wt 125 lb (56.7 kg)    BMI 23.05 kg/m   Weight change: @WEIGHTCHANGE @ Height:   Height: 5' 1.75" (156.8 cm)  Ht Readings from Last 3 Encounters:  11/17/21 5' 1.75" (1.568 m)  09/20/21 5\' 3"  (1.6 m)  06/08/21 5\' 3"  (1.6 m)    General appearance: alert, cooperative and appears stated age Head: Normocephalic, without obvious abnormality, atraumatic Neck: no adenopathy, supple, symmetrical, trachea midline and thyroid normal to inspection and palpation Lungs: clear to auscultation bilaterally Cardiovascular: regular rate and rhythm Breasts: normal appearance, no masses or tenderness Abdomen: soft, non-tender; non distended,  no masses,  no organomegaly Extremities: extremities normal, atraumatic, no cyanosis or edema Skin: Skin color, texture, turgor normal. No rashes or lesions Lymph nodes: Cervical, supraclavicular, and axillary nodes normal. No abnormal inguinal nodes palpated Neurologic: Grossly normal   Pelvic: External genitalia:  no lesions              Urethra:  normal appearing urethra with no masses, tenderness or lesions              Bartholins and Skenes: normal                 Vagina: normal appearing vagina with normal color and discharge, no lesions              Cervix: no lesions and anterior in her vagina               Bimanual Exam:  Uterus:   retroverted, mobile, 8 week sized (stable), not tender              Adnexa: no mass, fullness, tenderness               Rectovaginal: Confirms               Anus:  normal sphincter tone, no lesions  Gae Dry chaperoned for the exam.  1. Well woman exam No pap this year Mammogram due next fall Colonoscopy due this year, she will schedule DEXA UTD with her primary Labs UTD Discussed breast self exam Discussed calcium and  vit D intake

## 2021-11-14 ENCOUNTER — Encounter: Payer: Self-pay | Admitting: Family Medicine

## 2021-11-14 ENCOUNTER — Ambulatory Visit (INDEPENDENT_AMBULATORY_CARE_PROVIDER_SITE_OTHER): Payer: Medicare Other | Admitting: Family Medicine

## 2021-11-14 VITALS — BP 137/87 | HR 117 | Temp 98.2°F | Wt 126.0 lb

## 2021-11-14 DIAGNOSIS — R058 Other specified cough: Secondary | ICD-10-CM

## 2021-11-14 MED ORDER — CHLORPHENIRAMINE MALEATE 4 MG PO TABS: 4.0000 mg | ORAL_TABLET | Freq: Two times a day (BID) | ORAL | 0 refills | Status: DC | PRN

## 2021-11-14 MED ORDER — BENZONATATE 200 MG PO CAPS: 200.0000 mg | ORAL_CAPSULE | Freq: Two times a day (BID) | ORAL | 0 refills | Status: DC | PRN

## 2021-11-14 NOTE — Progress Notes (Signed)
Acute Office Visit  Subjective:    Patient ID: Yolanda Hernandez, female    DOB: 12-11-1955, 65 y.o.   MRN: [id number]  Chief Complaint  Patient presents with   URI    Drainage and cough at night. Sx started 10 days ago. Denies H/A, fever, fatigue. Negative covid. Taking OTC meds.     HPI Patient is in today for  cough and postnasal drainage.   Patient states that about 10 years ago, she had URI symptoms (rhinorrhea, itchy throat, nasal congestion, fatigue, cough). Today she reports that she is feeling much better, energy level is back, but she continues to have some night time symptoms including post nasal drainage triggering a cough. She has not had any sputum production in the past 3-4 days. She has been getting some relief with Claritin and cough drops. She had 2 negative COVID tests last week. She denies any fevers, headaches, sinus pressure, rhinorrhea, fatigue, chest pain, dyspnea, wheezing.       Past Medical History:  Diagnosis Date   Cerumen impaction    chronic   Cervical cancer screening 03/12/2015   Menarche at 10 Regular and moderate flow  history of abnormal pap in past, bx and cryo once many years ago, normal since. Last 2011 and normal G0P0, s/p No history of abnormal MGM Last in 2015, no breast concerns  No concerns today Biopsy, cervical: gyn surgeries Menopause at 77 ish   Conflict between patient and family 03/26/2021   Dysmenorrhea    Heart murmur 03/26/2021   Hematuria 07/31/2017   Hyperglycemia 03/19/2013   Hypertension    Muscle cramping 03/26/2021   Osteopenia    Osteoporosis 04/11/2010   Qualifier: Diagnosis of  By: Wynona Luna    Other and unspecified hyperlipidemia 03/19/2013   Right calf pain 03/22/2021   Skin cancer    Skin cancer 07/14/2016   Uterine fibroid     Past Surgical History:  Procedure Laterality Date   BREAST SURGERY     biopsy left   COLPOSCOPY     GYNECOLOGIC CRYOSURGERY     MYRINGOPLASTY  1972   TONSILLECTOMY      Family  History  Problem Relation Age of Onset   Osteoporosis Mother    Hyperlipidemia Mother    Hypertension Mother    Macular degeneration Mother    Heart disease Father    Diabetes Father    Hyperlipidemia Father    Hypertension Father    Diabetes Sister    Heart disease Sister        s/p cardiac stent   Hyperlipidemia Sister    Hypertension Sister    Osteoporosis Sister    Kidney disease Other    Alcohol abuse Other    Colon cancer Neg Hx     Social History   Socioeconomic History   Marital status: Married    Spouse name: Not on file   Number of children: Not on file   Years of education: Not on file   Highest education level: Not on file  Occupational History   Not on file  Tobacco Use   Smoking status: Never   Smokeless tobacco: Never  Vaping Use   Vaping Use: Never used  Substance and Sexual Activity   Alcohol use: Yes    Comment: 1 glass Wine every other week per pt.   Drug use: No   Sexual activity: Yes    Birth control/protection: Post-menopausal    Comment: lives with husband, works at  speech pathologist, no dietary restricitons, exercises with strength training, elliptical etc  Other Topics Concern   Not on file  Social History Narrative   Not on file   Social Determinants of Health   Financial Resource Strain: Low Risk    Difficulty of Paying Living Expenses: Not hard at all  Food Insecurity: No Food Insecurity   Worried About [place] in the Last Year: Never true   [place] in the Last Year: Never true  Transportation Needs: No Transportation Needs   Lack of Transportation (Medical): No   Lack of Transportation (Non-Medical): No  Physical Activity: Sufficiently Active   Days of Exercise per Week: 6 days   Minutes of Exercise per Session: 60 min  Stress: No Stress Concern Present   Feeling of Stress : Not at all  Social Connections: Moderately Isolated   Frequency of Communication with Friends and Family: More than three times a  week   Frequency of Social Gatherings with Friends and Family: More than three times a week   Attends Religious Services: Never   Marine scientist or Organizations: No   Attends Music therapist: Never   Marital Status: Married  Human resources officer Violence: Not At Risk   Fear of Current or Ex-Partner: No   Emotionally Abused: No   Physically Abused: No   Sexually Abused: No    Outpatient Medications Prior to Visit  Medication Sig Dispense Refill   Calcium-Vitamin D-Vitamin K (CALCIUM SOFT CHEWS PO) Take 1 each by mouth daily. Unknown strength     denosumab (PROLIA) 60 MG/ML SOSY injection INJECT 60MG  SUBCUTANEOUSLY  EVERY 6 MONTHS (GIVEN AT MD OFFICE) (Patient taking differently: Inject 60 mg into the skin every 6 (six) months. INJECT 60MG  SUBCUTANEOUSLY  EVERY 6 MONTHS (GIVEN AT MD OFFICE)) 1 mL 1   Probiotic Product (PROBIOTIC PO) Take 1 tablet by mouth daily.     tretinoin (RETIN-A) 0.05 % cream Apply topically at bedtime. (Patient taking differently: Apply 1 tablet topically at bedtime.) 45 g 5   COVID-19 mRNA bivalent vaccine, Pfizer, (PFIZER COVID-19 VAC BIVALENT) injection Inject into the muscle. (Patient not taking: Reported on 09/20/2021) 0.3 mL 0   Multiple Vitamins-Minerals (PRESERVISION AREDS 2) CHEW Chew 1 tablet by mouth daily. Unknown strength (Patient not taking: Reported on 11/14/2021)     No facility-administered medications prior to visit.    Allergies  Allergen Reactions   Iodine Rash   Sulfa Antibiotics Rash    Review of Systems All review of systems negative except what is listed in the HPI     Objective:    Physical Exam Vitals reviewed.  Constitutional:      Appearance: Normal appearance. She is normal weight.  HENT:     Head: Normocephalic and atraumatic.     Right Ear: Tympanic membrane normal.     Left Ear: Tympanic membrane normal.     Nose: Nose normal.     Mouth/Throat:     Mouth: Mucous membranes are moist.     Pharynx:  Oropharynx is clear. No oropharyngeal exudate or posterior oropharyngeal erythema.  Eyes:     Extraocular Movements: Extraocular movements intact.     Conjunctiva/sclera: Conjunctivae normal.  Cardiovascular:     Rate and Rhythm: Normal rate and regular rhythm.  Pulmonary:     Effort: Pulmonary effort is normal.     Breath sounds: Normal breath sounds. No wheezing, rhonchi or rales.  Musculoskeletal:     Cervical  back: Normal range of motion and neck supple. No tenderness.  Lymphadenopathy:     Cervical: No cervical adenopathy.  Skin:    General: Skin is warm and dry.     Capillary Refill: Capillary refill takes less than 2 seconds.  Neurological:     General: No focal deficit present.     Mental Status: She is alert and oriented to person, place, and time. Mental status is at baseline.  Psychiatric:        Mood and Affect: Mood normal.        Behavior: Behavior normal.        Thought Content: Thought content normal.        Judgment: Judgment normal.     BP 137/87    Pulse (!) 117    Temp 98.2 F (36.8 C)    Wt 126 lb (57.2 kg)    SpO2 98%    BMI 22.32 kg/m  Wt Readings from Last 3 Encounters:  11/14/21 126 lb (57.2 kg)  09/20/21 126 lb 12.8 oz (57.5 kg)  06/08/21 125 lb (56.7 kg)    Health Maintenance Due  Topic Date Due   Pneumonia Vaccine 46+ Years old (3) 07/14/2021    There are no preventive care reminders to display for this patient.   Lab Results  Component Value Date   TSH 4.13 03/22/2021   Lab Results  Component Value Date   WBC 7.0 03/22/2021   HGB 15.1 (H) 03/22/2021   HCT 44.8 03/22/2021   MCV 92.8 03/22/2021   PLT 333.0 03/22/2021   Lab Results  Component Value Date   NA 141 03/22/2021   K 4.1 03/22/2021   CO2 30 03/22/2021   GLUCOSE 88 03/22/2021   BUN 18 03/22/2021   CREATININE 0.78 03/22/2021   BILITOT 0.5 03/22/2021   ALKPHOS 45 03/22/2021   AST 16 03/22/2021   ALT 15 03/22/2021   PROT 6.6 03/22/2021   ALBUMIN 4.4 03/22/2021    CALCIUM 9.7 03/22/2021   GFR 79.15 03/22/2021   Lab Results  Component Value Date   CHOL 224 (H) 03/22/2021   Lab Results  Component Value Date   HDL 59.00 03/22/2021   Lab Results  Component Value Date   LDLCALC 130 (H) 03/22/2021   Lab Results  Component Value Date   TRIG 172.0 (H) 03/22/2021   Lab Results  Component Value Date   CHOLHDL 4 03/22/2021   Lab Results  Component Value Date   HGBA1C 5.9 03/22/2021       Assessment & Plan:   1. Post-viral cough syndrome Start with over-the-counter cough syrup as needed, especially at bedtime Chlorpheniramine at bedtime Tessalon throughout the day, BID for the next week or two Sip on water throughout the day, warm liquids, soups, etc. Cough lozenges as needed    - benzonatate (TESSALON) 200 MG capsule; Take 1 capsule (200 mg total) by mouth 2 (two) times daily as needed for cough.  Dispense: 30 capsule; Refill: 0 - chlorpheniramine (CHLOR-TRIMETON) 4 MG tablet; Take 1 tablet (4 mg total) by mouth 2 (two) times daily as needed for allergies.  Dispense: 30 tablet; Refill: 0   Follow-up if symptoms worsen or fail to improve.    Terrilyn Saver, NP

## 2021-11-14 NOTE — Patient Instructions (Signed)
Start with over-the-counter cough syrup as needed, especially at bedtime Chlorpheniramine at bedtime Tessalon throughout the day Sip on water throughout the day, warm liquids, soups, etc. Cough lozenges as needed

## 2021-11-17 ENCOUNTER — Other Ambulatory Visit: Payer: Self-pay

## 2021-11-17 ENCOUNTER — Encounter: Payer: Self-pay | Admitting: Obstetrics and Gynecology

## 2021-11-17 ENCOUNTER — Ambulatory Visit (INDEPENDENT_AMBULATORY_CARE_PROVIDER_SITE_OTHER): Payer: Medicare Other | Admitting: Obstetrics and Gynecology

## 2021-11-17 VITALS — BP 120/70 | HR 98 | Resp 16 | Ht 61.75 in | Wt 125.0 lb

## 2021-11-17 DIAGNOSIS — B977 Papillomavirus as the cause of diseases classified elsewhere: Secondary | ICD-10-CM

## 2021-11-17 DIAGNOSIS — Z01419 Encounter for gynecological examination (general) (routine) without abnormal findings: Secondary | ICD-10-CM

## 2021-11-21 ENCOUNTER — Encounter: Payer: Self-pay | Admitting: Obstetrics and Gynecology

## 2021-11-21 NOTE — Patient Instructions (Signed)

## 2021-11-22 ENCOUNTER — Other Ambulatory Visit: Payer: Self-pay | Admitting: Family Medicine

## 2021-11-28 ENCOUNTER — Telehealth: Payer: Self-pay

## 2021-11-28 NOTE — Telephone Encounter (Signed)
Prolia VOB initiated via [email or web address]  Last OV:  Next OV:  Last Prolia inj: 07/13/21 Next Prolia inj DUE: 01/11/22

## 2021-12-01 ENCOUNTER — Encounter: Payer: Self-pay | Admitting: Obstetrics and Gynecology

## 2021-12-01 ENCOUNTER — Telehealth: Payer: Self-pay | Admitting: Family Medicine

## 2021-12-01 NOTE — Telephone Encounter (Signed)
Erich Montane called from [facility] regarding pt.   Please contact [phone] to discuss meds for pt.

## 2021-12-02 NOTE — Telephone Encounter (Signed)
Called back and they just needed the address to ship the prolia to, office address was given

## 2021-12-05 NOTE — Telephone Encounter (Signed)
Patient called for Pap smear result. I informed her no Pap smear performed this year and on her 2021 Pap smear Dr. Talbert Nan recommended 36 mos follow up Pap smear.  Patient questioned that in 2019 she had "abnormal Pap result" (actually Pap neg but HPV pos with neg 16, 18.45)  and thought that she was supposed to have Pap smears yearly until she had three normal in a row.   Dr Talbert Nan noted 11/17/21 visit "Health Maintenance: Pap:  09-30-20 neg HPV HR neg, 09-30-19 neg HPV HR neg,08/22/2018 positive HPV, neg 16/18/45.:  I told her I would check with Dr. Talbert Nan. She is fine with waiting until 2024 for Pap smear if okay with Dr. Lenna Sciara.

## 2021-12-06 NOTE — Telephone Encounter (Signed)
We received Prolia from patient assistance.  Is she ready to be scheduled?

## 2021-12-06 NOTE — Telephone Encounter (Signed)
Prior auth required for PROLIA  PA PROCESS DETAILS: Please complete the prior authorization form located at [email or web address] or call [phone]  

## 2021-12-08 ENCOUNTER — Encounter: Payer: Self-pay | Admitting: Cardiology

## 2021-12-08 ENCOUNTER — Ambulatory Visit (INDEPENDENT_AMBULATORY_CARE_PROVIDER_SITE_OTHER): Payer: Medicare Other | Admitting: Cardiology

## 2021-12-08 ENCOUNTER — Ambulatory Visit: Payer: Medicare Other | Admitting: Cardiology

## 2021-12-08 ENCOUNTER — Other Ambulatory Visit: Payer: Self-pay

## 2021-12-08 VITALS — BP 136/70 | HR 88 | Ht 62.0 in | Wt 125.0 lb

## 2021-12-08 DIAGNOSIS — I351 Nonrheumatic aortic (valve) insufficiency: Secondary | ICD-10-CM | POA: Diagnosis not present

## 2021-12-08 DIAGNOSIS — E785 Hyperlipidemia, unspecified: Secondary | ICD-10-CM

## 2021-12-08 DIAGNOSIS — I7121 Aneurysm of the ascending aorta, without rupture: Secondary | ICD-10-CM | POA: Diagnosis not present

## 2021-12-08 DIAGNOSIS — I10 Essential (primary) hypertension: Secondary | ICD-10-CM

## 2021-12-08 NOTE — Progress Notes (Signed)
Cardiology Office Note:    Date:  12/08/2021   ID:  Yolanda Hernandez, DOB [birth year], MRN [id number]  PCP:  Mosie Lukes, MD  Cardiologist:  Jenne Campus, MD    Referring MD: Mosie Lukes, MD   No chief complaint on file. I am doing fine  History of Present Illness:    Yolanda Hernandez is a 67 y.o. female with past medical history significant for aortic insufficiency which is assessed as moderate to severe, essential hypertension, dyslipidemia, also last echocardiogram showed mild elevation of pulm artery pressure.  She comes today to my office for follow-up.  Overall she is doing very well.  She denies have any chest pain tightness squeezing pressure burning chest.  She goes to gym on the regular basis exercise pushing herself heart and have no difficulty doing it.  There is no shortness of breath more than expected with exercises, she tells me that she feels better than year before.  There is no swelling of lower extremities no palpitations no dizziness.  Past Medical History:  Diagnosis Date   Cerumen impaction    chronic   Cervical cancer screening 03/12/2015   Menarche at 10 Regular and moderate flow  history of abnormal pap in past, bx and cryo once many years ago, normal since. Last 2011 and normal G0P0, s/p No history of abnormal MGM Last in 2015, no breast concerns  No concerns today Biopsy, cervical: gyn surgeries Menopause at 41 ish   Conflict between patient and family 03/26/2021   Dysmenorrhea    Heart murmur 03/26/2021   Hematuria 07/31/2017   HPV in female 2019   Hyperglycemia 03/19/2013   Hypertension    Muscle cramping 03/26/2021   Osteopenia    Osteoporosis 04/11/2010   Qualifier: Diagnosis of  By: Wynona Luna    Other and unspecified hyperlipidemia 03/19/2013   Right calf pain 03/22/2021   Skin cancer    Skin cancer 07/14/2016   Uterine fibroid     Past Surgical History:  Procedure Laterality Date   BREAST SURGERY     biopsy left    COLPOSCOPY     GYNECOLOGIC CRYOSURGERY     MYRINGOPLASTY  1972   TONSILLECTOMY      Current Medications: Current Meds  Medication Sig   Calcium-Vitamin D-Vitamin K (CALCIUM SOFT CHEWS PO) Take 1 each by mouth daily. Unknown strength   denosumab (PROLIA) 60 MG/ML SOSY injection INJECT 60MG  SUBCUTANEOUSLY  EVERY 6 MONTHS   Multiple Vitamins-Minerals (PRESERVISION AREDS 2+MULTI VIT PO) Take 1 tablet by mouth in the morning and at bedtime.   Probiotic Product (PROBIOTIC PO) Take 1 tablet by mouth daily.   tretinoin (RETIN-A) 0.05 % cream Apply topically at bedtime. (Patient taking differently: Apply 1 tablet topically at bedtime.)     Allergies:   Iodine and Sulfa antibiotics   Social History   Socioeconomic History   Marital status: Married    Spouse name: Not on file   Number of children: Not on file   Years of education: Not on file   Highest education level: Not on file  Occupational History   Not on file  Tobacco Use   Smoking status: Never   Smokeless tobacco: Never  Vaping Use   Vaping Use: Never used  Substance and Sexual Activity   Alcohol use: Yes    Comment: 1 glass Wine every other week   Drug use: No    Comment: older than 15, less than 5  Sexual activity: Yes    Birth control/protection: Post-menopausal  Other Topics Concern   Not on file  Social History Narrative   Not on file   Social Determinants of Health   Financial Resource Strain: Low Risk    Difficulty of Paying Living Expenses: Not hard at all  Food Insecurity: No Food Insecurity   Worried About Charity fundraiser in the Last Year: Never true   [place] in the Last Year: Never true  Transportation Needs: No Transportation Needs   Lack of Transportation (Medical): No   Lack of Transportation (Non-Medical): No  Physical Activity: Sufficiently Active   Days of Exercise per Week: 6 days   Minutes of Exercise per Session: 60 min  Stress: No Stress Concern Present   Feeling of Stress :  Not at all  Social Connections: Moderately Isolated   Frequency of Communication with Friends and Family: More than three times a week   Frequency of Social Gatherings with Friends and Family: More than three times a week   Attends Religious Services: Never   Marine scientist or Organizations: No   Attends Music therapist: Never   Marital Status: Married     Family History: The patient's family history includes Alcohol abuse in an other family member; Diabetes in her father and sister; Heart disease in her father and sister; Hyperlipidemia in her father, mother, and sister; Hypertension in her father, mother, and sister; Kidney disease in an other family member; Macular degeneration in her mother; Osteoporosis in her mother and sister. There is no history of Colon cancer. ROS:   Please see the history of present illness.    All 14 point review of systems negative except as described per history of present illness  EKGs/Labs/Other Studies Reviewed:      Recent Labs: 03/22/2021: ALT 15; BUN 18; Creatinine, Ser 0.78; Hemoglobin 15.1; Magnesium 2.0; Platelets 333.0; Potassium 4.1; Sodium 141; TSH 4.13  Recent Lipid Panel    Component Value Date/Time   CHOL 224 (H) 03/22/2021 1557   CHOL 213 (H) 09/30/2019 0913   TRIG 172.0 (H) 03/22/2021 1557   HDL 59.00 03/22/2021 1557   HDL 55 09/30/2019 0913   CHOLHDL 4 03/22/2021 1557   VLDL 34.4 03/22/2021 1557   LDLCALC 130 (H) 03/22/2021 1557   LDLCALC 137 (H) 09/30/2019 0913    Physical Exam:    VS:  BP 136/70 (BP Location: Left Arm)    Pulse 88    Ht 5\' 2"  (1.575 m)    Wt 125 lb (56.7 kg)    SpO2 97%    BMI 22.86 kg/m     Wt Readings from Last 3 Encounters:  12/08/21 125 lb (56.7 kg)  11/17/21 125 lb (56.7 kg)  11/14/21 126 lb (57.2 kg)     GEN:  Well nourished, well developed in no acute distress HEENT: Normal NECK: No JVD; No carotid bruits LYMPHATICS: No lymphadenopathy CARDIAC: RRR, soft systolic murmur  grade 1/6 right upper portion of the sternum, left border of the sternum showing diastolic murmur which is only 2/6 no rubs, no gallops RESPIRATORY:  Clear to auscultation without rales, wheezing or rhonchi  ABDOMEN: Soft, non-tender, non-distended MUSCULOSKELETAL:  No edema; No deformity  SKIN: Warm and dry LOWER EXTREMITIES: no swelling NEUROLOGIC:  Alert and oriented x 3 PSYCHIATRIC:  Normal affect   ASSESSMENT:    1. Nonrheumatic aortic valve insufficiency   2. Aneurysm of ascending aorta without rupture   3.  Primary hypertension   4. Dyslipidemia    PLAN:    In order of problems listed above:  Aortic insufficiency last assessed 6 months ago which was moderate to severe.  We will repeat echocardiogram.  She does not have any symptomatology suggesting worsening of the problem there is no indication for intervention, however purpose of echocardiogram will be to look at the size of the left ventricle as well as left ventricle ejection fraction dropping of the ejection fraction or increasing size of LV would be indications for surgery. Dyslipidemia.  Last time we calculated her 10 years predicted risk which was intermediate.  She was reluctant to take medications.  CT of her chest done for enlargement of the aorta showed calcification of the coronary arteries therefore my opinion she need to be taken statin.  She is still reluctant she agree however to have fasting lipid profile done if cholesterol still elevated which I suspect will be will initiate probably Crestor 10 on Lipitor 10.  Her last K PN show me her cholesterol from 03/22/2021 with LDL of 130 HDL ratio 9. Essential hypertension blood pressure well controlled continue present management. Pulmonary pressure elevation noted on echocardiogram.  She does not have any signs or symptoms of this.  Will repeat echocardiogram for her aortic insufficiency and by doing that we will look at the pulmonary pressure again. Enlargement of the  ascending aorta.  Only in the very ascending portion of the aorta.  She did have ultrasounds of the abdomen showing normal abdominal aortic size, and also rest of the thoracic artery did not show any enlargement.  Therefore, we can follow-up this enlargement by doing echocardiogram which we will do. 6.  Possibility of peripheral vascular disease.  She is very concerned about calcification of the coronary arteries.  I told her that since she can exercise on the regular basis with no symptomatology I do not think we need to do any work-up for ischemia however she is concerned about carotid artery she will make sure there is no problem there I do not hear any bruit but she want me to schedule her to have carotic ultrasounds which I think is a reasonable test to do  Medication Adjustments/Labs and Tests Ordered: Current medicines are reviewed at length with the patient today.  Concerns regarding medicines are outlined above.  No orders of the defined types were placed in this encounter.  Medication changes: No orders of the defined types were placed in this encounter.   Signed, Park Liter, MD, [facility] 12/08/2021 8:47 AM    [place]

## 2021-12-08 NOTE — Patient Instructions (Signed)
Medication Instructions:  Your physician recommends that you continue on your current medications as directed. Please refer to the Current Medication list given to you today.  *If you need a refill on your cardiac medications before your next appointment, please call your pharmacy*   Lab Work: Your physician recommends that you return for lab work in:   Labs today: Lipids  If you have labs (blood work) drawn today and your tests are completely normal, you will receive your results only by: [place] (if you have [place]) OR A paper copy in the mail If you have any lab test that is abnormal or we need to change your treatment, we will call you to review the results.   Testing/Procedures: Your physician has requested that you have a carotid duplex. This test is an ultrasound of the carotid arteries in your neck. It looks at blood flow through these arteries that supply the brain with blood. Allow one hour for this exam. There are no restrictions or special instructions.   Your physician has requested that you have an echocardiogram. Echocardiography is a painless test that uses sound waves to create images of your heart. It provides your doctor with information about the size and shape of your heart and how well your hearts chambers and valves are working. This procedure takes approximately one hour. There are no restrictions for this procedure.    Follow-Up: At [facility], you and your health needs are our priority.  As part of our continuing mission to provide you with exceptional heart care, we have created designated Provider Care Teams.  These Care Teams include your primary Cardiologist (physician) and Advanced Practice Providers (APPs -  Physician Assistants and Nurse Practitioners) who all work together to provide you with the care you need, when you need it.  We recommend signing up for the patient portal called "MyChart".  Sign up information is provided on this After  Visit Summary.  MyChart is used to connect with patients for Virtual Visits (Telemedicine).  Patients are able to view lab/test results, encounter notes, upcoming appointments, etc.  Non-urgent messages can be sent to your provider as well.   To learn more about what you can do with MyChart, go to [email or web address].    Your next appointment:   6 month(s)  The format for your next appointment:   In Person  Provider:   Jenne Campus, MD    Other Instructions None

## 2021-12-09 LAB — LIPID PANEL
Chol/HDL Ratio: 4 ratio (ref 0.0–4.4)
Cholesterol, Total: 210 mg/dL — ABNORMAL HIGH (ref 100–199)
HDL: 53 mg/dL (ref >39)
LDL Chol Calc (NIH): 135 mg/dL — ABNORMAL HIGH (ref 0–99)
Triglycerides: 124 mg/dL (ref 0–149)
VLDL Cholesterol Cal: 22 mg/dL (ref 5–40)

## 2021-12-13 ENCOUNTER — Other Ambulatory Visit: Payer: Self-pay

## 2021-12-13 ENCOUNTER — Ambulatory Visit (HOSPITAL_BASED_OUTPATIENT_CLINIC_OR_DEPARTMENT_OTHER)
Admission: RE | Admit: 2021-12-13 | Discharge: 2021-12-13 | Disposition: A | Discharge status: Home / Self Care | Payer: Medicare Other | Source: Ambulatory Visit | Attending: Cardiology | Admitting: Cardiology

## 2021-12-13 DIAGNOSIS — E785 Hyperlipidemia, unspecified: Secondary | ICD-10-CM

## 2021-12-13 DIAGNOSIS — I10 Essential (primary) hypertension: Secondary | ICD-10-CM

## 2021-12-13 DIAGNOSIS — R0609 Other forms of dyspnea: Secondary | ICD-10-CM

## 2021-12-13 DIAGNOSIS — I351 Nonrheumatic aortic (valve) insufficiency: Secondary | ICD-10-CM | POA: Diagnosis not present

## 2021-12-13 DIAGNOSIS — I7121 Aneurysm of the ascending aorta, without rupture: Secondary | ICD-10-CM

## 2021-12-13 LAB — ECHOCARDIOGRAM COMPLETE
AR max vel: 2.94 cm2
AV Area VTI: 3.13 cm2
AV Area mean vel: 3.1 cm2
AV Mean grad: 4 mmHg
AV Peak grad: 9.6 mmHg
Ao pk vel: 1.55 m/s
Area-P 1/2: 6.54 cm2
P 1/2 time: 334 msec
S' Lateral: 3.1 cm

## 2021-12-13 NOTE — Progress Notes (Signed)
°  Echocardiogram 2D Echocardiogram has been performed.  Yolanda Hernandez 12/13/2021, 12:53 PM

## 2021-12-15 ENCOUNTER — Other Ambulatory Visit: Payer: Self-pay

## 2021-12-15 ENCOUNTER — Ambulatory Visit (HOSPITAL_COMMUNITY)
Admission: RE | Admit: 2021-12-15 | Discharge: 2021-12-15 | Disposition: A | Discharge status: Home / Self Care | Payer: Medicare Other | Source: Ambulatory Visit | Attending: Cardiology | Admitting: Cardiology

## 2021-12-15 DIAGNOSIS — I7121 Aneurysm of the ascending aorta, without rupture: Secondary | ICD-10-CM | POA: Diagnosis present

## 2021-12-15 DIAGNOSIS — I351 Nonrheumatic aortic (valve) insufficiency: Secondary | ICD-10-CM | POA: Diagnosis present

## 2021-12-15 DIAGNOSIS — I10 Essential (primary) hypertension: Secondary | ICD-10-CM | POA: Insufficient documentation

## 2021-12-15 DIAGNOSIS — E782 Mixed hyperlipidemia: Secondary | ICD-10-CM

## 2021-12-15 DIAGNOSIS — E785 Hyperlipidemia, unspecified: Secondary | ICD-10-CM | POA: Diagnosis present

## 2021-12-15 MED ORDER — ROSUVASTATIN CALCIUM 10 MG PO TABS: 10.0000 mg | ORAL_TABLET | Freq: Every day | ORAL | 3 refills | Status: DC

## 2021-12-16 ENCOUNTER — Encounter: Payer: Self-pay | Admitting: Family Medicine

## 2021-12-16 NOTE — Telephone Encounter (Signed)
Called pt. No answer but left VM with Dr. Irene Limbo recommendations. Provided office number if patient has questions or concerns. ?

## 2021-12-19 NOTE — Telephone Encounter (Signed)
Prior auth approved ?PA# [id number] ? ? ?

## 2021-12-21 NOTE — Telephone Encounter (Signed)
Pt ready for scheduling on or after 01/11/22 ? ?Out-of-pocket cost due at time of visit: $$200 (deductible) ? ?Primary: U[employer] Medicare ?Prolia co-insurance: 0% ?Admin fee co-insurance: 0% ? ?Secondary: n/a ?Prolia co-insurance:  ?Admin fee co-insurance:  ? ?Deductible: $0 of $200 met ? ?Prior Auth: APPROVED ?PA# [id number] ?Valid: 12/19/21-12/20/22 ?  ? ?** This summary of benefits is an estimation of the patient's out-of-pocket cost. Exact cost may very based on individual plan coverage.  ? ?

## 2021-12-26 NOTE — Telephone Encounter (Signed)
My Chart message sent

## 2022-01-15 ENCOUNTER — Telehealth: Payer: Medicare Other | Admitting: Nurse Practitioner

## 2022-01-15 DIAGNOSIS — U071 COVID-19: Secondary | ICD-10-CM

## 2022-01-15 MED ORDER — MOLNUPIRAVIR EUA 200MG CAPSULE: 4.0000 | ORAL_CAPSULE | Freq: Two times a day (BID) | ORAL | 0 refills | Status: AC

## 2022-01-15 NOTE — Patient Instructions (Signed)
1. Take meds as prescribed ?2. Use a cool mist humidifier especially during the winter months and when heat has been humid. ?3. Use saline nose sprays frequently ?4. Saline irrigations of the nose can be very helpful if done frequently. ? * 4X daily for 1 week* ? * Use of a nettie pot can be helpful with this. Follow directions with this* ?5. Drink plenty of fluids ?6. Keep thermostat turn down low ?7.For any cough or congestion- delsym if needed ?8. For fever or aces or pains- take tylenol or ibuprofen appropriate for age and weight. ? * for fevers greater than 101 orally you may alternate ibuprofen and tylenol every  3 hours. ?  ? ?

## 2022-01-15 NOTE — Progress Notes (Signed)
? ?Virtual Visit Consent  ? ?Yolanda Hernandez, you are scheduled for a virtual visit with Yolanda Hernandez, Yolanda Hernandez, a [facility] provider, today.   ?  ?Just as with appointments in the office, your consent must be obtained to participate.  Your consent will be active for this visit and any virtual visit you may have with one of our providers in the next 365 days.   ?  ?If you have a MyChart account, a copy of this consent can be sent to you electronically.  All virtual visits are billed to your insurance company just like a traditional visit in the office.   ? ?As this is a virtual visit, video technology does not allow for your provider to perform a traditional examination.  This may limit your provider's ability to fully assess your condition.  If your provider identifies any concerns that need to be evaluated in person or the need to arrange testing (such as labs, EKG, etc.), we will make arrangements to do so.   ?  ?Although advances in technology are sophisticated, we cannot ensure that it will always work on either your end or our end.  If the connection with a video visit is poor, the visit may have to be switched to a telephone visit.  With either a video or telephone visit, we are not always able to ensure that we have a secure connection.    ? ?I need to obtain your verbal consent now.   Are you willing to proceed with your visit today? YES ?  ?Yolanda Hernandez has provided verbal consent on 01/15/2022 for a virtual visit (video or telephone). ?  ?Yolanda Yolanda Done, FNP  ? ?Date: 01/15/2022 4:09 PM ? ? ?Virtual Visit via Video Note  ? ?I, Yolanda Hernandez, connected with Yolanda Hernandez ([id number], 1956/03/27) on 01/15/22 at  4:00 PM EDT by a video-enabled telemedicine application and verified that I am speaking with the correct person using two identifiers. ? ?Location: ?Patient: Virtual Visit Location Patient: Home ?Provider: Virtual Visit Location Provider: Mobile ?  ?I discussed the limitations  of evaluation and management by telemedicine and the availability of in person appointments. The patient expressed understanding and agreed to proceed.   ? ?History of Present Illness: ?Yolanda Hernandez is a 66 y.o. who identifies as a female who was assigned female at birth, and is being seen today for covid positive. ? ?HPI: Patient states her husband has been sick for several days. He tested positive for covid on Friday. She tested herself and she was negative. She retested this afternoon and was positive. ?  ?Review of Systems  ?Constitutional:  Negative for chills and fever.  ?HENT:  Positive for congestion. Negative for sore throat.   ?Musculoskeletal:  Negative for myalgias.  ?Neurological:  Negative for dizziness and headaches.  ? ?Problems:  ?Patient Active Problem List  ? Diagnosis Date Noted  ? Aortic insufficiency 06/08/2021  ? Ascending aortic aneurysm ([place]) 06/08/2021  ? Dyslipidemia 06/08/2021  ? Hypertension 06/07/2021  ? Cerumen impaction 06/07/2021  ? Conflict between patient and family 03/26/2021  ? Heart murmur 03/26/2021  ? Muscle cramping 03/26/2021  ? Right calf pain 03/22/2021  ? HPV in female 2019  ? Hematuria 07/31/2017  ? Asymmetric SNHL (sensorineural hearing loss) 11/29/2015  ? Bilateral impacted cerumen 11/29/2015  ? Subjective tinnitus of left ear 11/29/2015  ? Cervical cancer screening 03/12/2015  ? Rib pain 01/25/2015  ? Contact dermatitis 07/07/2013  ? Hyperlipidemia, mixed 03/19/2013  ?  Hyperglycemia 03/19/2013  ? Osteopenia 03/19/2013  ? Preventative health care 09/08/2011  ? Osteoporosis 04/11/2010  ? FIBROIDS, UTERUS 03/07/2010  ? ELEVATED BLOOD PRESSURE WITHOUT DIAGNOSIS OF HYPERTENSION 03/07/2010  ? SKIN CANCER, HX OF 03/07/2010  ?  ?Allergies:  ?Allergies  ?Allergen Reactions  ? Iodine Rash  ? Sulfa Antibiotics Rash  ? ?Medications:  ?Current Outpatient Medications:  ?  molnupiravir EUA (LAGEVRIO) 200 mg CAPS capsule, Take 4 capsules (800 mg total) by mouth 2 (two) times  daily for 5 days., Disp: 40 capsule, Rfl: 0 ?  Calcium-Vitamin D-Vitamin K (CALCIUM SOFT CHEWS PO), Take 1 each by mouth daily. Unknown strength, Disp: , Rfl:  ?  denosumab (PROLIA) 60 MG/ML SOSY injection, INJECT '60MG'$  SUBCUTANEOUSLY  EVERY 6 MONTHS, Disp: 1 mL, Rfl: 1 ?  Multiple Vitamins-Minerals (PRESERVISION AREDS 2+MULTI VIT PO), Take 1 tablet by mouth in the morning and at bedtime., Disp: , Rfl:  ?  Probiotic Product (PROBIOTIC PO), Take 1 tablet by mouth daily., Disp: , Rfl:  ?  rosuvastatin (CRESTOR) 10 MG tablet, Take 1 tablet (10 mg total) by mouth daily., Disp: 90 tablet, Rfl: 3 ?  tretinoin (RETIN-A) 0.05 % cream, Apply topically at bedtime. (Patient taking differently: Apply 1 tablet topically at bedtime.), Disp: 45 g, Rfl: 5 ? ?Observations/Objective: ?Patient is well-developed, well-nourished in no acute distress.  ?Resting comfortably  at home.  ?Head is normocephalic, atraumatic.  ?No labored breathing.  ?Speech is clear and coherent with logical content.  ?Patient is alert and oriented at baseline.  ? ? ?Assessment and Plan: ? ?Yolanda Hernandez in today with chief complaint of Covid Positive ? ? ?1. Positive self-administered antigen test for COVID-19 ?1. Take meds as prescribed ?2. Use a cool mist humidifier especially during the winter months and when heat has been humid. ?3. Use saline nose sprays frequently ?4. Saline irrigations of the nose can be very helpful if Hernandez frequently. ? * 4X daily for 1 week* ? * Use of a nettie pot can be helpful with this. Follow directions with this* ?5. Drink plenty of fluids ?6. Keep thermostat turn down low ?7.For any cough or congestion- delsym if needed ?8. For fever or aces or pains- take tylenol or ibuprofen appropriate for age and weight. ? * for fevers greater than 101 orally you may alternate ibuprofen and tylenol every  3 hours. ?  ? ?Meds ordered this encounter  ?Medications  ? molnupiravir EUA (LAGEVRIO) 200 mg CAPS capsule  ?  Sig: Take 4 capsules  (800 mg total) by mouth 2 (two) times daily for 5 days.  ?  Dispense:  40 capsule  ?  Refill:  0  ?  Order Specific Question:   Supervising Provider  ?  Answer:   Noemi Chapel [[id number]]  ? ? ? ? ? ?Follow Up Instructions: ?I discussed the assessment and treatment plan with the patient. The patient was provided an opportunity to ask questions and all were answered. The patient agreed with the plan and demonstrated an understanding of the instructions.  A copy of instructions were sent to the patient via MyChart. ? ?The patient was advised to call back or seek an in-person evaluation if the symptoms worsen or if the condition fails to improve as anticipated. ? ?Time:  ?I spent 9 minutes with the patient via telehealth technology discussing the above problems/concerns.   ? ?Yolanda Yolanda Done, FNP ? ?

## 2022-02-01 ENCOUNTER — Ambulatory Visit (INDEPENDENT_AMBULATORY_CARE_PROVIDER_SITE_OTHER): Payer: Medicare Other

## 2022-02-01 DIAGNOSIS — M810 Age-related osteoporosis without current pathological fracture: Secondary | ICD-10-CM | POA: Diagnosis not present

## 2022-02-01 MED ORDER — DENOSUMAB 60 MG/ML ~~LOC~~ SOSY
60.0000 mg | PREFILLED_SYRINGE | Freq: Once | SUBCUTANEOUS | Status: AC
  Administered 2022-02-01: 60 mg via SUBCUTANEOUS

## 2022-02-01 NOTE — Progress Notes (Signed)
Yolanda Hernandez is a 66 y.o. female presents to the office  ?today for Prolia injection, per physician's orders. ? ?Prolia 60 mg administered SQ: in left arm. ?Patient tolerated injection. ?

## 2022-02-02 LAB — HEPATIC FUNCTION PANEL
ALT: 12 IU/L (ref 0–32)
AST: 10 IU/L (ref 0–40)
Albumin: 4.3 g/dL (ref 3.8–4.8)
Alkaline Phosphatase: 67 IU/L (ref 44–121)
Bilirubin Total: 0.5 mg/dL (ref 0.0–1.2)
Bilirubin, Direct: 0.15 mg/dL (ref 0.00–0.40)
Total Protein: 6.4 g/dL (ref 6.0–8.5)

## 2022-02-02 LAB — LIPID PANEL
Chol/HDL Ratio: 3 ratio (ref 0.0–4.4)
Cholesterol, Total: 128 mg/dL (ref 100–199)
HDL: 43 mg/dL (ref >39)
LDL Chol Calc (NIH): 57 mg/dL (ref 0–99)
Triglycerides: 169 mg/dL — ABNORMAL HIGH (ref 0–149)
VLDL Cholesterol Cal: 28 mg/dL (ref 5–40)

## 2022-02-04 NOTE — Telephone Encounter (Signed)
Last Prolia inj 02/01/22 ?Next Prolia inj due 08/04/22 ? ?Prior Auth: APPROVED ?PA# [id number] ?Valid: 12/19/21-12/20/22 ?

## 2022-03-22 NOTE — Progress Notes (Signed)
Subjective:    Patient ID: Yolanda Hernandez, female    DOB: [birth year], 66 y.o.   MRN: [id number]  Chief Complaint  Patient presents with   Annual Exam    HPI Patient is in today for her annual physical exam and follow up on chronic medical concerns. No recent febrile illness or acute hospitalizations. Her opthamologist saw some early changes suggestive of Macular degeneration and her mother had MD so she is worried no concerning symptoms. She will follow up with opthamology. She is staying active and maintaining a heart healthy diet. No complaints of polyuria or polydipsia. Denies CP/palp/SOB/HA/congestion/fevers/GI or GU c/o. Taking meds as prescribed   Past Medical History:  Diagnosis Date   Cerumen impaction    chronic   Cervical cancer screening 03/12/2015   Menarche at 10 Regular and moderate flow  history of abnormal pap in past, bx and cryo once many years ago, normal since. Last 2011 and normal G0P0, s/p No history of abnormal MGM Last in 2015, no breast concerns  No concerns today Biopsy, cervical: gyn surgeries Menopause at 76 ish   Conflict between patient and family 03/26/2021   Dysmenorrhea    Heart murmur 03/26/2021   Hematuria 07/31/2017   HPV in female 2019   Hyperglycemia 03/19/2013   Hypertension    Muscle cramping 03/26/2021   Osteopenia    Osteoporosis 04/11/2010   Qualifier: Diagnosis of  By: Wynona Luna    Other and unspecified hyperlipidemia 03/19/2013   Right calf pain 03/22/2021   Skin cancer    Skin cancer 07/14/2016   Uterine fibroid     Past Surgical History:  Procedure Laterality Date   BREAST SURGERY     biopsy left   COLPOSCOPY     GYNECOLOGIC CRYOSURGERY     MYRINGOPLASTY  1972   TONSILLECTOMY      Family History  Problem Relation Age of Onset   Osteoporosis Mother    Hyperlipidemia Mother    Hypertension Mother    Macular degeneration Mother    Arthritis Mother    Hearing loss Mother    Vision loss Mother    Heart disease  Father    Diabetes Father    Hyperlipidemia Father    Hypertension Father    Asthma Father    Diabetes Sister    Heart disease Sister        s/p cardiac stent   Hyperlipidemia Sister    Hypertension Sister    Osteoporosis Sister    Arthritis Maternal Grandmother    Vision loss Maternal Grandmother    Heart disease Paternal Grandfather    Kidney disease Other    Alcohol abuse Other    Heart disease Paternal Uncle    Colon cancer Neg Hx     Social History   Socioeconomic History   Marital status: Married    Spouse name: Not on file   Number of children: Not on file   Years of education: Not on file   Highest education level: Not on file  Occupational History   Not on file  Tobacco Use   Smoking status: Never   Smokeless tobacco: Never  Vaping Use   Vaping Use: Never used  Substance and Sexual Activity   Alcohol use: Yes    Comment: Less than 1 glass of wine or beer per week   Drug use: Never    Comment: older than 14, less than 5   Sexual activity: Yes    Birth control/protection:  Post-menopausal  Other Topics Concern   Not on file  Social History Narrative   Not on file   Social Determinants of Health   Financial Resource Strain: Low Risk  (09/20/2021)   Overall Financial Resource Strain (CARDIA)    Difficulty of Paying Living Expenses: Not hard at all  Food Insecurity: No Food Insecurity (09/20/2021)   Hunger Vital Sign    Worried About Running Out of Food in the Last Year: Never true    Ran Out of Food in the Last Year: Never true  Transportation Needs: No Transportation Needs (09/20/2021)   PRAPARE - Hydrologist (Medical): No    Lack of Transportation (Non-Medical): No  Physical Activity: Sufficiently Active (09/20/2021)   Exercise Vital Sign    Days of Exercise per Week: 6 days    Minutes of Exercise per Session: 60 min  Stress: No Stress Concern Present (09/20/2021)   [place]    Feeling of Stress : Not at all  Social Connections: Moderately Isolated (09/20/2021)   Social Connection and Isolation Panel [NHANES]    Frequency of Communication with Friends and Family: More than three times a week    Frequency of Social Gatherings with Friends and Family: More than three times a week    Attends Religious Services: Never    Marine scientist or Organizations: No    Attends Archivist Meetings: Never    Marital Status: Married  Human resources officer Violence: Not At Risk (09/20/2021)   Humiliation, Afraid, Rape, and Kick questionnaire    Fear of Current or Ex-Partner: No    Emotionally Abused: No    Physically Abused: No    Sexually Abused: No    Outpatient Medications Prior to Visit  Medication Sig Dispense Refill   Calcium-Vitamin D-Vitamin K (CALCIUM SOFT CHEWS PO) Take 1 each by mouth daily. Unknown strength     denosumab (PROLIA) 60 MG/ML SOSY injection INJECT '60MG'$  SUBCUTANEOUSLY  EVERY 6 MONTHS 1 mL 1   Multiple Vitamins-Minerals (PRESERVISION AREDS 2+MULTI VIT PO) Take 1 tablet by mouth in the morning and at bedtime.     Probiotic Product (PROBIOTIC PO) Take 1 tablet by mouth daily.     rosuvastatin (CRESTOR) 10 MG tablet Take 1 tablet (10 mg total) by mouth daily. 90 tablet 3   tretinoin (RETIN-A) 0.05 % cream Apply topically at bedtime. (Patient taking differently: Apply 1 tablet topically at bedtime.) 45 g 5   No facility-administered medications prior to visit.    Allergies  Allergen Reactions   Iodine Rash   Sulfa Antibiotics Rash    Review of Systems  Constitutional:  Negative for chills, fever and malaise/fatigue.  HENT:  Negative for congestion and hearing loss.   Eyes:  Negative for discharge.  Respiratory:  Negative for cough, sputum production and shortness of breath.   Cardiovascular:  Negative for chest pain, palpitations and leg swelling.  Gastrointestinal:  Negative for abdominal pain, blood in stool,  constipation, diarrhea, heartburn, nausea and vomiting.  Genitourinary:  Negative for dysuria, frequency, hematuria and urgency.  Musculoskeletal:  Negative for back pain, falls and myalgias.  Skin:  Negative for rash.  Neurological:  Negative for dizziness, sensory change, loss of consciousness, weakness and headaches.  Endo/Heme/Allergies:  Negative for environmental allergies. Does not bruise/bleed easily.  Psychiatric/Behavioral:  Negative for depression and suicidal ideas. The patient is not nervous/anxious and does not have insomnia.  Objective:    Physical Exam Constitutional:      General: She is not in acute distress.    Appearance: She is well-developed.  HENT:     Head: Normocephalic and atraumatic.  Eyes:     Conjunctiva/sclera: Conjunctivae normal.  Neck:     Thyroid: No thyromegaly.  Cardiovascular:     Rate and Rhythm: Normal rate and regular rhythm.     Heart sounds: Normal heart sounds. No murmur heard. Pulmonary:     Effort: Pulmonary effort is normal. No respiratory distress.     Breath sounds: Normal breath sounds.  Abdominal:     General: Bowel sounds are normal. There is no distension.     Palpations: Abdomen is soft. There is no mass.     Tenderness: There is no abdominal tenderness.  Musculoskeletal:     Cervical back: Neck supple.  Lymphadenopathy:     Cervical: No cervical adenopathy.  Skin:    General: Skin is warm and dry.  Neurological:     Mental Status: She is alert and oriented to person, place, and time.  Psychiatric:        Behavior: Behavior normal.     BP 124/70 (BP Location: Left Arm, Patient Position: Sitting, Cuff Size: Normal)   Pulse 83   Resp 20   Ht '5\' 2"'$  (1.575 m)   Wt 126 lb (57.2 kg)   SpO2 98%   BMI 23.05 kg/m  Wt Readings from Last 3 Encounters:  03/23/22 126 lb (57.2 kg)  12/08/21 125 lb (56.7 kg)  11/17/21 125 lb (56.7 kg)    Diabetic Foot Exam - Simple   No data filed    Lab Results  Component  Value Date   WBC 7.0 03/22/2021   HGB 15.1 (H) 03/22/2021   HCT 44.8 03/22/2021   PLT 333.0 03/22/2021   GLUCOSE 88 03/22/2021   CHOL 128 02/01/2022   TRIG 169 (H) 02/01/2022   HDL 43 02/01/2022   LDLCALC 57 02/01/2022   ALT 12 02/01/2022   AST 10 02/01/2022   NA 141 03/22/2021   K 4.1 03/22/2021   CL 104 03/22/2021   CREATININE 0.78 03/22/2021   BUN 18 03/22/2021   CO2 30 03/22/2021   TSH 4.13 03/22/2021   HGBA1C 5.9 03/22/2021    Lab Results  Component Value Date   TSH 4.13 03/22/2021   Lab Results  Component Value Date   WBC 7.0 03/22/2021   HGB 15.1 (H) 03/22/2021   HCT 44.8 03/22/2021   MCV 92.8 03/22/2021   PLT 333.0 03/22/2021   Lab Results  Component Value Date   NA 141 03/22/2021   K 4.1 03/22/2021   CO2 30 03/22/2021   GLUCOSE 88 03/22/2021   BUN 18 03/22/2021   CREATININE 0.78 03/22/2021   BILITOT 0.5 02/01/2022   ALKPHOS 67 02/01/2022   AST 10 02/01/2022   ALT 12 02/01/2022   PROT 6.4 02/01/2022   ALBUMIN 4.3 02/01/2022   CALCIUM 9.7 03/22/2021   GFR 79.15 03/22/2021   Lab Results  Component Value Date   CHOL 128 02/01/2022   Lab Results  Component Value Date   HDL 43 02/01/2022   Lab Results  Component Value Date   LDLCALC 57 02/01/2022   Lab Results  Component Value Date   TRIG 169 (H) 02/01/2022   Lab Results  Component Value Date   CHOLHDL 3.0 02/01/2022   Lab Results  Component Value Date   HGBA1C 5.9 03/22/2021  Assessment & Plan:   Problem List Items Addressed This Visit     Osteoporosis   Relevant Orders   VITAMIN D 25 Hydroxy (Vit-D Deficiency, Fractures)   Preventative health care - Primary    Patient encouraged to maintain heart healthy diet, regular exercise, adequate sleep. Consider daily probiotics. Take medications as prescribed. Labs ordered and reviewed. Immunizations UTD Had Pneumovax 20. Last colonoscopy 2018 needs repeat in next year  Last [facility] 06/2021 repeat late 2023 early 2024Last Dexa 11/2021  repeat in 2025-26. Last Pap 2021  Repeat 2024-25      Hyperlipidemia, mixed    Encourage heart healthy diet such as MIND or DASH diet, increase exercise, avoid trans fats, simple carbohydrates and processed foods, consider a krill or fish or flaxseed oil cap daily. Tolerating Rosuvastatin      Relevant Orders   Lipid panel   Hyperglycemia    hgba1c acceptable, minimize simple carbs. Increase exercise as tolerated.       Relevant Orders   Hemoglobin A1c   Osteopenia    Encouraged to get adequate exercise, calcium and vitamin d intake. Continue Prolia given history      Hypertension    Well controlled, no changes to meds. Encouraged heart healthy diet such as the DASH diet and exercise as tolerated.       Relevant Orders   TSH   Comprehensive metabolic panel   CBC   Magnesium   Aortic insufficiency    Following with cardiology      History of COVID-19    Early 2023. Is fully recovered. Will consider a covid booster in the fall      Other Visit Diagnoses     Muscle cramping       Relevant Orders   TSH   Colon cancer screening       Relevant Orders   Ambulatory referral to Gastroenterology   Hx of colonic polyp       Relevant Orders   Ambulatory referral to Gastroenterology       I am having Christean Grief. Drew maintain her Calcium-Vitamin D-Vitamin K (CALCIUM SOFT CHEWS PO), Probiotic Product (PROBIOTIC PO), tretinoin, Prolia, Multiple Vitamins-Minerals (PRESERVISION AREDS 2+MULTI VIT PO), and rosuvastatin.  No orders of the defined types were placed in this encounter.

## 2022-03-23 ENCOUNTER — Encounter: Payer: Self-pay | Admitting: Family Medicine

## 2022-03-23 ENCOUNTER — Ambulatory Visit (INDEPENDENT_AMBULATORY_CARE_PROVIDER_SITE_OTHER): Payer: Medicare Other | Admitting: Family Medicine

## 2022-03-23 VITALS — BP 124/70 | HR 83 | Resp 20 | Ht 62.0 in | Wt 126.0 lb

## 2022-03-23 DIAGNOSIS — Z1211 Encounter for screening for malignant neoplasm of colon: Secondary | ICD-10-CM

## 2022-03-23 DIAGNOSIS — M8580 Other specified disorders of bone density and structure, unspecified site: Secondary | ICD-10-CM

## 2022-03-23 DIAGNOSIS — I351 Nonrheumatic aortic (valve) insufficiency: Secondary | ICD-10-CM

## 2022-03-23 DIAGNOSIS — E782 Mixed hyperlipidemia: Secondary | ICD-10-CM | POA: Diagnosis not present

## 2022-03-23 DIAGNOSIS — I10 Essential (primary) hypertension: Secondary | ICD-10-CM | POA: Diagnosis not present

## 2022-03-23 DIAGNOSIS — Z0000 Encounter for general adult medical examination without abnormal findings: Secondary | ICD-10-CM

## 2022-03-23 DIAGNOSIS — Z86010 Personal history of colonic polyps: Secondary | ICD-10-CM

## 2022-03-23 DIAGNOSIS — R739 Hyperglycemia, unspecified: Secondary | ICD-10-CM

## 2022-03-23 DIAGNOSIS — M810 Age-related osteoporosis without current pathological fracture: Secondary | ICD-10-CM

## 2022-03-23 DIAGNOSIS — Z8616 Personal history of COVID-19: Secondary | ICD-10-CM | POA: Insufficient documentation

## 2022-03-23 DIAGNOSIS — R252 Cramp and spasm: Secondary | ICD-10-CM

## 2022-03-23 NOTE — Assessment & Plan Note (Signed)
Encourage heart healthy diet such as MIND or DASH diet, increase exercise, avoid trans fats, simple carbohydrates and processed foods, consider a krill or fish or flaxseed oil cap daily.  Tolerating Rosuvastatin 

## 2022-03-23 NOTE — Patient Instructions (Signed)
Preventive Care 65 Years and Older, Female Preventive care refers to lifestyle choices and visits with your health care provider that can promote health and wellness. Preventive care visits are also called wellness exams. What can I expect for my preventive care visit? Counseling Your health care provider may ask you questions about your: Medical history, including: Past medical problems. Family medical history. Pregnancy and menstrual history. History of falls. Current health, including: Memory and ability to understand (cognition). Emotional well-being. Home life and relationship well-being. Sexual activity and sexual health. Lifestyle, including: Alcohol, nicotine or tobacco, and drug use. Access to firearms. Diet, exercise, and sleep habits. Work and work environment. Sunscreen use. Safety issues such as seatbelt and bike helmet use. Physical exam Your health care provider will check your: Height and weight. These may be used to calculate your BMI (body mass index). BMI is a measurement that tells if you are at a healthy weight. Waist circumference. This measures the distance around your waistline. This measurement also tells if you are at a healthy weight and may help predict your risk of certain diseases, such as type 2 diabetes and high blood pressure. Heart rate and blood pressure. Body temperature. Skin for abnormal spots. What immunizations do I need?  Vaccines are usually given at various ages, according to a schedule. Your health care provider will recommend vaccines for you based on your age, medical history, and lifestyle or other factors, such as travel or where you work. What tests do I need? Screening Your health care provider may recommend screening tests for certain conditions. This may include: Lipid and cholesterol levels. Hepatitis C test. Hepatitis B test. HIV (human immunodeficiency virus) test. STI (sexually transmitted infection) testing, if you are at  risk. Lung cancer screening. Colorectal cancer screening. Diabetes screening. This is done by checking your blood sugar (glucose) after you have not eaten for a while (fasting). Mammogram. Talk with your health care provider about how often you should have regular mammograms. BRCA-related cancer screening. This may be done if you have a family history of breast, ovarian, tubal, or peritoneal cancers. Bone density scan. This is done to screen for osteoporosis. Talk with your health care provider about your test results, treatment options, and if necessary, the need for more tests. Follow these instructions at home: Eating and drinking  Eat a diet that includes fresh fruits and vegetables, whole grains, lean protein, and low-fat dairy products. Limit your intake of foods with high amounts of sugar, saturated fats, and salt. Take vitamin and mineral supplements as recommended by your health care provider. Do not drink alcohol if your health care provider tells you not to drink. If you drink alcohol: Limit how much you have to 0-1 drink a day. Know how much alcohol is in your drink. In the [place] one drink equals one 12 oz bottle of beer (355 mL), one 5 oz glass of wine (148 mL), or one 1 oz glass of hard liquor (44 mL). Lifestyle Brush your teeth every morning and night with fluoride toothpaste. Floss one time each day. Exercise for at least 30 minutes 5 or more days each week. Do not use any products that contain nicotine or tobacco. These products include cigarettes, chewing tobacco, and vaping devices, such as e-cigarettes. If you need help quitting, ask your health care provider. Do not use drugs. If you are sexually active, practice safe sex. Use a condom or other form of protection in order to prevent STIs. Take aspirin only as told by   your health care provider. Make sure that you understand how much to take and what form to take. Work with your health care provider to find out whether it  is safe and beneficial for you to take aspirin daily. Ask your health care provider if you need to take a cholesterol-lowering medicine (statin). Find healthy ways to manage stress, such as: Meditation, yoga, or listening to music. Journaling. Talking to a trusted person. Spending time with friends and family. Minimize exposure to UV radiation to reduce your risk of skin cancer. Safety Always wear your seat belt while driving or riding in a vehicle. Do not drive: If you have been drinking alcohol. Do not ride with someone who has been drinking. When you are tired or distracted. While texting. If you have been using any mind-altering substances or drugs. Wear a helmet and other protective equipment during sports activities. If you have firearms in your house, make sure you follow all gun safety procedures. What's next? Visit your health care provider once a year for an annual wellness visit. Ask your health care provider how often you should have your eyes and teeth checked. Stay up to date on all vaccines. This information is not intended to replace advice given to you by your health care provider. Make sure you discuss any questions you have with your health care provider. Document Revised: 03/30/2021 Document Reviewed: 03/30/2021 Elsevier Patient Education  2023 [employer]  

## 2022-03-23 NOTE — Assessment & Plan Note (Signed)
hgba1c acceptable, minimize simple carbs. Increase exercise as tolerated.  

## 2022-03-23 NOTE — Assessment & Plan Note (Signed)
Encouraged to get adequate exercise, calcium and vitamin d intake. Continue Prolia given history

## 2022-03-23 NOTE — Assessment & Plan Note (Signed)
Following with cardiology. 

## 2022-03-23 NOTE — Assessment & Plan Note (Addendum)
Patient encouraged to maintain heart healthy diet, regular exercise, adequate sleep. Consider daily probiotics. Take medications as prescribed. Labs ordered and reviewed. Immunizations UTD Had Pneumovax 20. Last colonoscopy 2018 needs repeat in next year  Last [facility] 06/2021 repeat late 2023 early 2024Last Dexa 11/2021 repeat in 2025-26. Last Pap 2021  Repeat 2024-25

## 2022-03-23 NOTE — Assessment & Plan Note (Signed)
Early 2023. Is fully recovered. Will consider a covid booster in the fall

## 2022-03-23 NOTE — Assessment & Plan Note (Signed)
Well controlled, no changes to meds. Encouraged heart healthy diet such as the DASH diet and exercise as tolerated.  °

## 2022-03-31 ENCOUNTER — Other Ambulatory Visit (INDEPENDENT_AMBULATORY_CARE_PROVIDER_SITE_OTHER): Payer: Medicare Other

## 2022-03-31 DIAGNOSIS — M810 Age-related osteoporosis without current pathological fracture: Secondary | ICD-10-CM | POA: Diagnosis not present

## 2022-03-31 DIAGNOSIS — E782 Mixed hyperlipidemia: Secondary | ICD-10-CM | POA: Diagnosis not present

## 2022-03-31 DIAGNOSIS — R739 Hyperglycemia, unspecified: Secondary | ICD-10-CM

## 2022-03-31 DIAGNOSIS — I10 Essential (primary) hypertension: Secondary | ICD-10-CM | POA: Diagnosis not present

## 2022-03-31 DIAGNOSIS — R252 Cramp and spasm: Secondary | ICD-10-CM

## 2022-03-31 LAB — CBC
HCT: 38.9 % (ref 36.0–46.0)
Hemoglobin: 13.1 g/dL (ref 12.0–15.0)
MCHC: 33.8 g/dL (ref 30.0–36.0)
MCV: 92.8 fl (ref 78.0–100.0)
Platelets: 300 10*3/uL (ref 150.0–400.0)
RBC: 4.19 Mil/uL (ref 3.87–5.11)
RDW: 12.8 % (ref 11.5–15.5)
WBC: 4.7 10*3/uL (ref 4.0–10.5)

## 2022-03-31 LAB — LIPID PANEL
Cholesterol: 133 mg/dL (ref 0–200)
HDL: 52.4 mg/dL (ref >39.00)
LDL Cholesterol: 58 mg/dL (ref 0–99)
NonHDL: 80.89
Total CHOL/HDL Ratio: 3
Triglycerides: 113 mg/dL (ref 0.0–149.0)
VLDL: 22.6 mg/dL (ref 0.0–40.0)

## 2022-03-31 LAB — COMPREHENSIVE METABOLIC PANEL
ALT: 19 U/L (ref 0–35)
AST: 18 U/L (ref 0–37)
Albumin: 4 g/dL (ref 3.5–5.2)
Alkaline Phosphatase: 41 U/L (ref 39–117)
BUN: 12 mg/dL (ref 6–23)
CO2: 32 mEq/L (ref 19–32)
Calcium: 9.4 mg/dL (ref 8.4–10.5)
Chloride: 103 mEq/L (ref 96–112)
Creatinine, Ser: 0.7 mg/dL (ref 0.40–1.20)
GFR: 89.48 mL/min (ref >60.00)
Glucose, Bld: 90 mg/dL (ref 70–99)
Potassium: 4.1 mEq/L (ref 3.5–5.1)
Sodium: 141 mEq/L (ref 135–145)
Total Bilirubin: 0.6 mg/dL (ref 0.2–1.2)
Total Protein: 6.1 g/dL (ref 6.0–8.3)

## 2022-03-31 LAB — MAGNESIUM: Magnesium: 1.9 mg/dL (ref 1.5–2.5)

## 2022-03-31 LAB — HEMOGLOBIN A1C: Hgb A1c MFr Bld: 5.6 % (ref 4.6–6.5)

## 2022-03-31 LAB — TSH: TSH: 4.82 u[IU]/mL (ref 0.35–5.50)

## 2022-03-31 LAB — VITAMIN D 25 HYDROXY (VIT D DEFICIENCY, FRACTURES): VITD: 46.29 ng/mL (ref 30.00–100.00)

## 2022-03-31 NOTE — Addendum Note (Signed)
Addended by: Kelle Darting A on: 03/31/2022 07:50 AM   Modules accepted: Orders

## 2022-04-24 DIAGNOSIS — H61303 Acquired stenosis of external ear canal, unspecified, bilateral: Secondary | ICD-10-CM | POA: Insufficient documentation

## 2022-06-20 ENCOUNTER — Ambulatory Visit: Payer: Medicare Other | Attending: Cardiology | Admitting: Cardiology

## 2022-06-20 ENCOUNTER — Encounter: Payer: Self-pay | Admitting: Gastroenterology

## 2022-06-20 ENCOUNTER — Encounter: Payer: Self-pay | Admitting: Cardiology

## 2022-06-20 VITALS — BP 124/68 | HR 75 | Ht 62.0 in | Wt 127.0 lb

## 2022-06-20 DIAGNOSIS — E782 Mixed hyperlipidemia: Secondary | ICD-10-CM | POA: Diagnosis not present

## 2022-06-20 DIAGNOSIS — I351 Nonrheumatic aortic (valve) insufficiency: Secondary | ICD-10-CM | POA: Diagnosis not present

## 2022-06-20 DIAGNOSIS — I10 Essential (primary) hypertension: Secondary | ICD-10-CM | POA: Diagnosis not present

## 2022-06-20 DIAGNOSIS — I7121 Aneurysm of the ascending aorta, without rupture: Secondary | ICD-10-CM

## 2022-06-20 DIAGNOSIS — R0609 Other forms of dyspnea: Secondary | ICD-10-CM

## 2022-06-20 NOTE — Patient Instructions (Signed)

## 2022-06-20 NOTE — Progress Notes (Signed)
Cardiology Office Note:    Date:  06/20/2022   ID:  VERLISA VARA, DOB 10/06/1956, MRN [id number]  PCP:  Mosie Lukes, MD  Cardiologist:  Jenne Campus, MD    Referring MD: Mosie Lukes, MD   Chief Complaint  Patient presents with   Follow-up    History of Present Illness:    Yolanda Hernandez is a 66 y.o. female with past medical history significant for moderate to severe arctic insufficiency with normal ejection fraction normal LV size, essential hypertension, dyslipidemia.  Also has mild elevation of pulm artery pressure.  Comes to my office for follow-up.  Overall she is doing very well.  She is asymptomatic, there is no chest pain tightness squeezing pressure burning chest.  She is very active likes to travel a lot and enjoyed traveling with no difficulties.  Past Medical History:  Diagnosis Date   Cerumen impaction    chronic   Cervical cancer screening 03/12/2015   Menarche at 10 Regular and moderate flow  history of abnormal pap in past, bx and cryo once many years ago, normal since. Last 2011 and normal G0P0, s/p No history of abnormal MGM Last in 2015, no breast concerns  No concerns today Biopsy, cervical: gyn surgeries Menopause at 42 ish   Conflict between patient and family 03/26/2021   Dysmenorrhea    Heart murmur 03/26/2021   Hematuria 07/31/2017   HPV in female 2019   Hyperglycemia 03/19/2013   Hypertension    Muscle cramping 03/26/2021   Osteopenia    Osteoporosis 04/11/2010   Qualifier: Diagnosis of  By: Wynona Luna    Other and unspecified hyperlipidemia 03/19/2013   Right calf pain 03/22/2021   Skin cancer    Skin cancer 07/14/2016   Uterine fibroid     Past Surgical History:  Procedure Laterality Date   BREAST SURGERY     biopsy left   COLPOSCOPY     GYNECOLOGIC CRYOSURGERY     MYRINGOPLASTY  1972   TONSILLECTOMY      Current Medications: Current Meds  Medication Sig   Calcium-Vitamin D-Vitamin K (CALCIUM SOFT CHEWS PO) Take  1 each by mouth daily. Unknown strength   denosumab (PROLIA) 60 MG/ML SOSY injection INJECT '60MG'$  SUBCUTANEOUSLY  EVERY 6 MONTHS (Patient taking differently: Inject 60 mg into the skin every 6 (six) months. INJECT '60MG'$  SUBCUTANEOUSLY  EVERY 6 MONTHS)   Multiple Vitamins-Minerals (PRESERVISION AREDS 2+MULTI VIT PO) Take 1 tablet by mouth in the morning and at bedtime.   Probiotic Product (PROBIOTIC PO) Take 1 tablet by mouth daily.   rosuvastatin (CRESTOR) 10 MG tablet Take 1 tablet (10 mg total) by mouth daily.   tretinoin (RETIN-A) 0.05 % cream Apply topically at bedtime. (Patient taking differently: Apply 1 tablet topically at bedtime.)     Allergies:   Iodine and Sulfa antibiotics   Social History   Socioeconomic History   Marital status: Married    Spouse name: Not on file   Number of children: Not on file   Years of education: Not on file   Highest education level: Not on file  Occupational History   Not on file  Tobacco Use   Smoking status: Never   Smokeless tobacco: Never  Vaping Use   Vaping Use: Never used  Substance and Sexual Activity   Alcohol use: Yes    Comment: Less than 1 glass of wine or beer per week   Drug use: Never    Comment: older  than 15, less than 5   Sexual activity: Yes    Birth control/protection: Post-menopausal  Other Topics Concern   Not on file  Social History Narrative   Not on file   Social Determinants of Health   Financial Resource Strain: Low Risk  (09/20/2021)   Overall Financial Resource Strain (CARDIA)    Difficulty of Paying Living Expenses: Not hard at all  Food Insecurity: No Food Insecurity (09/20/2021)   Hunger Vital Sign    Worried About Running Out of Food in the Last Year: Never true    Ran Out of Food in the Last Year: Never true  Transportation Needs: No Transportation Needs (09/20/2021)   PRAPARE - Hydrologist (Medical): No    Lack of Transportation (Non-Medical): No  Physical Activity:  Sufficiently Active (09/20/2021)   Exercise Vital Sign    Days of Exercise per Week: 6 days    Minutes of Exercise per Session: 60 min  Stress: No Stress Concern Present (09/20/2021)   [place]    Feeling of Stress : Not at all  Social Connections: Moderately Isolated (09/20/2021)   Social Connection and Isolation Panel [NHANES]    Frequency of Communication with Friends and Family: More than three times a week    Frequency of Social Gatherings with Friends and Family: More than three times a week    Attends Religious Services: Never    Marine scientist or Organizations: No    Attends Music therapist: Never    Marital Status: Married     Family History: The patient's family history includes Alcohol abuse in an other family member; Arthritis in her maternal grandmother and mother; Asthma in her father; Diabetes in her father and sister; Hearing loss in her mother; Heart disease in her father, paternal grandfather, paternal uncle, and sister; Hyperlipidemia in her father, mother, and sister; Hypertension in her father, mother, and sister; Kidney disease in an other family member; Macular degeneration in her mother; Osteoporosis in her mother and sister; Vision loss in her maternal grandmother and mother. There is no history of Colon cancer. ROS:   Please see the history of present illness.    All 14 point review of systems negative except as described per history of present illness  EKGs/Labs/Other Studies Reviewed:      Recent Labs: 03/31/2022: ALT 19; BUN 12; Creatinine, Ser 0.70; Hemoglobin 13.1; Magnesium 1.9; Platelets 300.0; Potassium 4.1; Sodium 141; TSH 4.82  Recent Lipid Panel    Component Value Date/Time   CHOL 133 03/31/2022 0750   CHOL 128 02/01/2022 1012   TRIG 113.0 03/31/2022 0750   HDL 52.40 03/31/2022 0750   HDL 43 02/01/2022 1012   CHOLHDL 3 03/31/2022 0750   VLDL 22.6 03/31/2022  0750   LDLCALC 58 03/31/2022 0750   LDLCALC 57 02/01/2022 1012    Physical Exam:    VS:  BP 124/68 (BP Location: Left Arm, Patient Position: Sitting)   Pulse 75   Ht '5\' 2"'$  (1.575 m)   Wt 127 lb (57.6 kg)   SpO2 96%   BMI 23.23 kg/m     Wt Readings from Last 3 Encounters:  06/20/22 127 lb (57.6 kg)  03/23/22 126 lb (57.2 kg)  12/08/21 125 lb (56.7 kg)     GEN:  Well nourished, well developed in no acute distress HEENT: Normal NECK: No JVD; No carotid bruits LYMPHATICS: No lymphadenopathy CARDIAC: RRR, diastolic murmur  grade 1/6 to 2/6 best heard left border of sternum, no rubs, no gallops RESPIRATORY:  Clear to auscultation without rales, wheezing or rhonchi  ABDOMEN: Soft, non-tender, non-distended MUSCULOSKELETAL:  No edema; No deformity  SKIN: Warm and dry LOWER EXTREMITIES: no swelling NEUROLOGIC:  Alert and oriented x 3 PSYCHIATRIC:  Normal affect   ASSESSMENT:    1. Nonrheumatic aortic valve insufficiency   2. Aneurysm of ascending aorta without rupture ([place])   3. Primary hypertension   4. Hyperlipidemia, mixed    PLAN:    In order of problems listed above:  Aortic insufficiency, last echocardiogram showed normal left ventricle ejection fraction normal left ventricle size, mild to moderate aortic insufficiency.  She is completely asymptomatic she will have another echocardiogram done in about a month or 2 I want her about signs and symptoms of worsening of heart insufficiency lets ask her to let me know if it happens Ascending aortic enlargement: Last echocardiogram however showed arctic root size of 40 mm History of pulmonary hypertension last echocardiogram did not show any pulmonary hypertension. Overall clinically she is doing well continue present management Dyslipidemia she is taking Crestor 10 and her LDL is a 58 HDL 52 excellent cholesterol profile, continue present management   Medication Adjustments/Labs and Tests Ordered: Current medicines are  reviewed at length with the patient today.  Concerns regarding medicines are outlined above.  No orders of the defined types were placed in this encounter.  Medication changes: No orders of the defined types were placed in this encounter.   Signed, Park Liter, MD, [facility] 06/20/2022 1:58 PM    [facility]

## 2022-06-20 NOTE — Addendum Note (Signed)
Addended by: Jacobo Forest D on: 06/20/2022 02:10 PM   Modules accepted: Orders

## 2022-06-24 NOTE — Telephone Encounter (Signed)
Prolia VOB initiated via [email or web address] 

## 2022-07-04 LAB — HM MAMMOGRAPHY

## 2022-07-05 ENCOUNTER — Encounter: Payer: Self-pay | Admitting: Family Medicine

## 2022-07-05 NOTE — Telephone Encounter (Signed)
Pt ready for scheduling on or after 08/04/22   Out-of-pocket cost due at time of visit: $0   Primary: U[employer] Medicare Prolia co-insurance: 0% Admin fee co-insurance: 0%   Secondary: n/a Prolia co-insurance:  Admin fee co-insurance:    Deductible: $200 of $200 met   Prior Auth: APPROVED PA# [id number] Valid: 12/19/21-12/20/22     ** This summary of benefits is an estimation of the patient's out-of-pocket cost. Exact cost may very based on individual plan coverage.    

## 2022-07-06 ENCOUNTER — Ambulatory Visit (AMBULATORY_SURGERY_CENTER): Payer: Medicare Other

## 2022-07-06 ENCOUNTER — Other Ambulatory Visit: Payer: Self-pay

## 2022-07-06 VITALS — Ht 62.0 in | Wt 124.6 lb

## 2022-07-06 DIAGNOSIS — Z860100 Personal history of colon polyps, unspecified: Secondary | ICD-10-CM

## 2022-07-06 MED ORDER — NA SULFATE-K SULFATE-MG SULF 17.5-3.13-1.6 GM/177ML PO SOLN: 1.0000 | Freq: Once | ORAL | 0 refills | Status: AC

## 2022-07-06 NOTE — Telephone Encounter (Signed)
My Chart message sent

## 2022-07-06 NOTE — Progress Notes (Signed)
Denies allergies to eggs or soy products. Denies complication of anesthesia or sedation. Denies use of weight loss medication. Denies use of O2.   Emmi instructions given for colonoscopy.  

## 2022-07-13 ENCOUNTER — Encounter: Payer: Self-pay | Admitting: Gastroenterology

## 2022-07-20 ENCOUNTER — Telehealth: Payer: Self-pay

## 2022-07-20 NOTE — Telephone Encounter (Signed)
FYI: I called pt to let her know that she is due for next Prolia injection on 08/04/22. She says she is now on a program called "Osteostrong" (https://www.osteostronggso.com/how-it-works.html)  and is considering taking a holiday from Prolia since she has been on this for a number of years.

## 2022-07-24 ENCOUNTER — Encounter: Payer: Self-pay | Admitting: Family Medicine

## 2022-07-27 ENCOUNTER — Encounter: Payer: Self-pay | Admitting: Gastroenterology

## 2022-07-27 ENCOUNTER — Ambulatory Visit (AMBULATORY_SURGERY_CENTER): Payer: Medicare Other | Admitting: Gastroenterology

## 2022-07-27 VITALS — BP 132/66 | HR 60 | Temp 97.8°F | Resp 9 | Ht 62.0 in | Wt 124.0 lb

## 2022-07-27 DIAGNOSIS — Z860100 Personal history of colon polyps, unspecified: Secondary | ICD-10-CM

## 2022-07-27 DIAGNOSIS — Z09 Encounter for follow-up examination after completed treatment for conditions other than malignant neoplasm: Secondary | ICD-10-CM

## 2022-07-27 MED ORDER — SODIUM CHLORIDE 0.9 % IV SOLN: 500.0000 mL | Freq: Once | INTRAVENOUS | Status: DC

## 2022-07-27 NOTE — Progress Notes (Signed)
[facility] History and Physical   Primary Care Physician:  Mosie Lukes, MD   Reason for Procedure:  History of adenomatous colon polyps  Plan:    Surveillance colonoscopy with possible interventions as needed     HPI: Yolanda Hernandez is a very pleasant 66 y.o. female here for surveillance colonoscopy. Denies any nausea, vomiting, abdominal pain, melena or bright red blood per rectum  The risks and benefits as well as alternatives of endoscopic procedure(s) have been discussed and reviewed. All questions answered. The patient agrees to proceed.    Past Medical History:  Diagnosis Date   Cerumen impaction    chronic   Cervical cancer screening 03/12/2015   Menarche at 10 Regular and moderate flow  history of abnormal pap in past, bx and cryo once many years ago, normal since. Last 2011 and normal G0P0, s/p No history of abnormal MGM Last in 2015, no breast concerns  No concerns today Biopsy, cervical: gyn surgeries Menopause at 47 ish   Conflict between patient and family 03/26/2021   Dysmenorrhea    Heart murmur 03/26/2021   Hematuria 07/31/2017   HPV in female 2019   Hyperglycemia 03/19/2013   Hypertension    Muscle cramping 03/26/2021   Osteopenia    Osteoporosis 04/11/2010   Qualifier: Diagnosis of  By: Wynona Luna    Other and unspecified hyperlipidemia 03/19/2013   Right calf pain 03/22/2021   Skin cancer    Skin cancer 07/14/2016   Uterine fibroid     Past Surgical History:  Procedure Laterality Date   BREAST SURGERY     biopsy left   COLPOSCOPY     GYNECOLOGIC CRYOSURGERY     MYRINGOPLASTY  1972   TONSILLECTOMY      Prior to Admission medications   Medication Sig Start Date End Date Taking? Authorizing Provider  Calcium-Vitamin D-Vitamin K (CALCIUM SOFT CHEWS PO) Take 1 each by mouth daily. Unknown strength   Yes [provider]  denosumab (PROLIA) 60 MG/ML SOSY injection INJECT '60MG'$  SUBCUTANEOUSLY  EVERY 6  MONTHS Patient taking differently: Inject 60 mg into the skin every 6 (six) months. INJECT '60MG'$  SUBCUTANEOUSLY  EVERY 6 MONTHS 11/22/21  Yes Roma Schanz R, DO  Multiple Vitamins-Minerals (PRESERVISION AREDS 2+MULTI VIT PO) Take 1 tablet by mouth in the morning and at bedtime. 11/28/21  Yes [provider]  Probiotic Product (PROBIOTIC PO) Take 1 tablet by mouth daily.   Yes [provider]  rosuvastatin (CRESTOR) 10 MG tablet Take 1 tablet (10 mg total) by mouth daily. 12/15/21  Yes Park Liter, MD  tretinoin (RETIN-A) 0.05 % cream Apply topically at bedtime. Patient taking differently: Apply 1 tablet topically at bedtime. 03/22/21  Yes Mosie Lukes, MD    Current Outpatient Medications  Medication Sig Dispense Refill   Calcium-Vitamin D-Vitamin K (CALCIUM SOFT CHEWS PO) Take 1 each by mouth daily. Unknown strength     denosumab (PROLIA) 60 MG/ML SOSY injection INJECT '60MG'$  SUBCUTANEOUSLY  EVERY 6 MONTHS (Patient taking differently: Inject 60 mg into the skin every 6 (six) months. INJECT '60MG'$  SUBCUTANEOUSLY  EVERY 6 MONTHS) 1 mL 1   Multiple Vitamins-Minerals (PRESERVISION AREDS 2+MULTI VIT PO) Take 1 tablet by mouth in the morning and at bedtime.     Probiotic Product (PROBIOTIC PO) Take 1 tablet by mouth daily.     rosuvastatin (CRESTOR) 10 MG tablet Take 1 tablet (10 mg total) by mouth daily. 90 tablet 3   tretinoin (  RETIN-A) 0.05 % cream Apply topically at bedtime. (Patient taking differently: Apply 1 tablet topically at bedtime.) 45 g 5   Current Facility-Administered Medications  Medication Dose Route Frequency Provider Last Rate Last Admin   0.9 %  sodium chloride infusion  500 mL Intravenous Once Jayceon Troy, Venia Minks, MD        Allergies as of 07/27/2022 - Review Complete 07/27/2022  Allergen Reaction Noted   Iodine Rash 08/23/2011   Sulfa antibiotics Rash 08/23/2011    Family History  Problem Relation Age of Onset   Osteoporosis Mother     Hyperlipidemia Mother    Hypertension Mother    Macular degeneration Mother    Arthritis Mother    Hearing loss Mother    Vision loss Mother    Heart disease Father    Diabetes Father    Hyperlipidemia Father    Hypertension Father    Asthma Father    Diabetes Sister    Heart disease Sister        s/p cardiac stent   Hyperlipidemia Sister    Hypertension Sister    Osteoporosis Sister    Colon cancer Maternal Uncle    Heart disease Paternal Uncle    Arthritis Maternal Grandmother    Vision loss Maternal Grandmother    Heart disease Paternal Grandfather    Kidney disease Other    Alcohol abuse Other    Esophageal cancer Neg Hx    Stomach cancer Neg Hx    Rectal cancer Neg Hx     Social History   Socioeconomic History   Marital status: Married    Spouse name: Not on file   Number of children: Not on file   Years of education: Not on file   Highest education level: Not on file  Occupational History   Not on file  Tobacco Use   Smoking status: Never   Smokeless tobacco: Never  Vaping Use   Vaping Use: Never used  Substance and Sexual Activity   Alcohol use: Yes    Comment: Less than 1 glass of wine or beer per week   Drug use: Never    Comment: older than 32, less than 5   Sexual activity: Yes    Birth control/protection: Post-menopausal  Other Topics Concern   Not on file  Social History Narrative   Not on file   Social Determinants of Health   Financial Resource Strain: Low Risk  (09/20/2021)   Overall Financial Resource Strain (CARDIA)    Difficulty of Paying Living Expenses: Not hard at all  Food Insecurity: No Food Insecurity (09/20/2021)   Hunger Vital Sign    Worried About Running Out of Food in the Last Year: Never true    Ran Out of Food in the Last Year: Never true  Transportation Needs: No Transportation Needs (09/20/2021)   PRAPARE - Hydrologist (Medical): No    Lack of Transportation (Non-Medical): No  Physical  Activity: Sufficiently Active (09/20/2021)   Exercise Vital Sign    Days of Exercise per Week: 6 days    Minutes of Exercise per Session: 60 min  Stress: No Stress Concern Present (09/20/2021)   [place]    Feeling of Stress : Not at all  Social Connections: Moderately Isolated (09/20/2021)   Social Connection and Isolation Panel [NHANES]    Frequency of Communication with Friends and Family: More than three times a week    Frequency  of Social Gatherings with Friends and Family: More than three times a week    Attends Religious Services: Never    Marine scientist or Organizations: No    Attends Archivist Meetings: Never    Marital Status: Married  Human resources officer Violence: Not At Risk (09/20/2021)   Humiliation, Afraid, Rape, and Kick questionnaire    Fear of Current or Ex-Partner: No    Emotionally Abused: No    Physically Abused: No    Sexually Abused: No    Review of Systems:  All other review of systems negative except as mentioned in the HPI.  Physical Exam: Vital signs in last 24 hours: Blood Pressure (Abnormal) 123/56   Pulse 77   Temperature 97.8 F (36.6 C)   Respiration 10   Height '5\' 2"'$  (1.575 m)   Weight 124 lb (56.2 kg)   Oxygen Saturation 99%   Body Mass Index 22.68 kg/m  General:   Alert, NAD Lungs:  Clear .   Heart:  Regular rate and rhythm Abdomen:  Soft, nontender and nondistended. Neuro/Psych:  Alert and cooperative. Normal mood and affect. A and O x 3  Reviewed labs, radiology imaging, old records and pertinent past GI work up  Patient is appropriate for planned procedure(s) and anesthesia in an ambulatory setting   K. Denzil Magnuson , MD [phone]

## 2022-07-27 NOTE — Op Note (Signed)
[place] Patient Name: Yolanda Hernandez Procedure Date: 07/27/2022 7:54 AM MRN: [id number] Endoscopist: Mauri Pole , MD Age: 66 Referring MD:  Date of Birth: 01/17/1956 Gender: Female Account #: [id number] Procedure:                Colonoscopy Indications:              High risk colon cancer surveillance: Personal                            history of colonic polyps, High risk colon cancer                            surveillance: Personal history of adenoma less than                            10 mm in size Medicines:                Monitored Anesthesia Care Procedure:                Pre-Anesthesia Assessment:                           - Prior to the procedure, a History and Physical                            was performed, and patient medications and                            allergies were reviewed. The patient's tolerance of                            previous anesthesia was also reviewed. The risks                            and benefits of the procedure and the sedation                            options and risks were discussed with the patient.                            All questions were answered, and informed consent                            was obtained. Prior Anticoagulants: The patient has                            taken no previous anticoagulant or antiplatelet                            agents. ASA Grade Assessment: II - A patient with                            mild systemic disease. After reviewing the risks  and benefits, the patient was deemed in                            satisfactory condition to undergo the procedure.                           After obtaining informed consent, the colonoscope                            was passed under direct vision. Throughout the                            procedure, the patient's blood pressure, pulse, and                            oxygen saturations were monitored  continuously. The                            PCF-HQ190L Colonoscope was introduced through the                            anus and advanced to the the cecum, identified by                            appendiceal orifice and ileocecal valve. The                            colonoscopy was performed without difficulty. The                            patient tolerated the procedure well. The quality                            of the bowel preparation was good. The ileocecal                            valve, appendiceal orifice, and rectum were                            photographed. Scope In: 8:12:07 AM Scope Out: 8:25:57 AM Scope Withdrawal Time: 0 hours 6 minutes 44 seconds  Total Procedure Duration: 0 hours 13 minutes 50 seconds  Findings:                 The perianal and digital rectal examinations were                            normal.                           A few small-mouthed diverticula were found in the                            sigmoid colon.  Non-bleeding internal hemorrhoids were found during                            retroflexion. The hemorrhoids were small.                           The exam was otherwise without abnormality. Complications:            No immediate complications. Estimated Blood Loss:     Estimated blood loss was minimal. Impression:               - Diverticulosis in the sigmoid colon.                           - Non-bleeding internal hemorrhoids.                           - The examination was otherwise normal.                           - No specimens collected. Recommendation:           - Patient has a contact number available for                            emergencies. The signs and symptoms of potential                            delayed complications were discussed with the                            patient. Return to normal activities tomorrow.                            Written discharge instructions were provided to the                             patient.                           - Resume previous diet.                           - Continue present medications.                           - Repeat colonoscopy in 10 years for surveillance. Mauri Pole, MD 07/27/2022 8:35:25 AM This report has been signed electronically.

## 2022-07-27 NOTE — Patient Instructions (Signed)
Read all of the handouts given to you by your recovery room nurse.  YOU HAD AN ENDOSCOPIC PROCEDURE TODAY AT [facility]:   Refer to the procedure report that was given to you for any specific questions about what was found during the examination.  If the procedure report does not answer your questions, please call your gastroenterologist to clarify.  If you requested that your care partner not be given the details of your procedure findings, then the procedure report has been included in a sealed envelope for you to review at your convenience later.  YOU SHOULD EXPECT: Some feelings of bloating in the abdomen. Passage of more gas than usual.  Walking can help get rid of the air that was put into your GI tract during the procedure and reduce the bloating. If you had a lower endoscopy (such as a colonoscopy or flexible sigmoidoscopy) you may notice spotting of blood in your stool or on the toilet paper. If you underwent a bowel prep for your procedure, you may not have a normal bowel movement for a few days.  Please Note:  You might notice some irritation and congestion in your nose or some drainage.  This is from the oxygen used during your procedure.  There is no need for concern and it should clear up in a day or so.  SYMPTOMS TO REPORT IMMEDIATELY:  Following lower endoscopy (colonoscopy or flexible sigmoidoscopy):  Excessive amounts of blood in the stool  Significant tenderness or worsening of abdominal pains  Swelling of the abdomen that is new, acute  Fever of 100F or higher  For urgent or emergent issues, a gastroenterologist can be reached at any hour by calling [phone]. Do not use MyChart messaging for urgent concerns.    DIET:  We do recommend a small meal at first, but then you may proceed to your regular diet.  Drink plenty of fluids but you should avoid alcoholic beverages for 24 hours. Try to increase the fiber in your diet, and drink plenty of  water.  ACTIVITY:  You should plan to take it easy for the rest of today and you should NOT DRIVE or use heavy machinery until tomorrow (because of the sedation medicines used during the test).    FOLLOW UP: Our staff will call the number listed on your records the next business day following your procedure.  We will call around 7:15- 8:00 am to check on you and address any questions or concerns that you may have regarding the information given to you following your procedure. If we do not reach you, we will leave a message.      SIGNATURES/CONFIDENTIALITY: You and/or your care partner have signed paperwork which will be entered into your electronic medical record.  These signatures attest to the fact that that the information above on your After Visit Summary has been reviewed and is understood.  Full responsibility of the confidentiality of this discharge information lies with you and/or your care-partner.

## 2022-07-27 NOTE — Progress Notes (Signed)
Pt resting comfortably. VSS. Airway intact. SBAR complete to RN. All questions answered.   

## 2022-07-28 ENCOUNTER — Ambulatory Visit (INDEPENDENT_AMBULATORY_CARE_PROVIDER_SITE_OTHER): Payer: Medicare Other

## 2022-07-28 ENCOUNTER — Telehealth: Payer: Self-pay | Admitting: *Deleted

## 2022-07-28 DIAGNOSIS — Z23 Encounter for immunization: Secondary | ICD-10-CM | POA: Diagnosis not present

## 2022-07-28 NOTE — Progress Notes (Signed)
Pt here today for High Doze Influenza shot   Tolerated well in Left Deltoid

## 2022-07-28 NOTE — Telephone Encounter (Signed)
Attempted to call patient for their post-procedure follow-up call. No answer. Left voicemail.   

## 2022-08-04 ENCOUNTER — Encounter: Payer: Self-pay | Admitting: Gastroenterology

## 2022-08-11 ENCOUNTER — Telehealth: Payer: Self-pay | Admitting: Gastroenterology

## 2022-08-11 NOTE — Telephone Encounter (Signed)
Patient called is concerned and confused because she received a letter regarding a recall change due to guidelines from 2023 to 2025 however she just had a colonoscopy done in October please advise.

## 2022-08-14 NOTE — Telephone Encounter (Signed)
Please advise the patient to disregard.  Recall should have changed, but letter was sent out in error. No longer accurate.  Recall will now be based on procedure from 07/27/22

## 2022-08-14 NOTE — Telephone Encounter (Signed)
Called the patient. No answer. No voicemail. 

## 2022-08-18 ENCOUNTER — Ambulatory Visit (HOSPITAL_BASED_OUTPATIENT_CLINIC_OR_DEPARTMENT_OTHER)
Admission: RE | Admit: 2022-08-18 | Discharge: 2022-08-18 | Disposition: A | Discharge status: Home / Self Care | Payer: Medicare Other | Source: Ambulatory Visit | Attending: Cardiology | Admitting: Cardiology

## 2022-08-18 DIAGNOSIS — R0609 Other forms of dyspnea: Secondary | ICD-10-CM | POA: Insufficient documentation

## 2022-08-18 DIAGNOSIS — I351 Nonrheumatic aortic (valve) insufficiency: Secondary | ICD-10-CM

## 2022-08-18 DIAGNOSIS — I77819 Aortic ectasia, unspecified site: Secondary | ICD-10-CM

## 2022-08-18 LAB — ECHOCARDIOGRAM COMPLETE
AR max vel: 2.63 cm2
AV Area VTI: 2.33 cm2
AV Area mean vel: 2.47 cm2
AV Mean grad: 4 mmHg
AV Peak grad: 7.8 mmHg
Ao pk vel: 1.4 m/s
Area-P 1/2: 4.15 cm2
P 1/2 time: 338 msec
S' Lateral: 3 cm
Single Plane A4C EF: 60.8 %

## 2022-09-04 NOTE — Telephone Encounter (Signed)
July 24, 2022 Mosie Lukes, MD  to LONISHA BOBBY      07/24/22  3:31 PM Greetings,   I think your logic is sound. The hard unknown is we do see an increase in risk of fracture after being off of bone meds awhile. It is not massive but it is real and can be life altering. That being said you are well motivated and doing all the right things so I really feel OK about you trying a holiday. We can rethink our position at your next visit and if circumstances change and you have to change your regimen let me know. My daughter is improving and her Post treatment PET scan was clear so her survival odds have jumped from roughly 30% to nearly 80% so such a relief. Thanks for asking, talk soon. Dr B  Last read by Lady Gary at  5:27 PM on 07/24/2022.       07/24/22  1:20 PM Cousar, Adriana Mccallum, CMA routed this conversation to Mosie Lukes, MD Lady Gary  to [facility] (supporting Mosie Lukes, MD)      07/24/22 11:57 AM Hi Dr. Charlett Blake, I am strongly considering discontinuation of Prolia injections. I have received them for several years, but continue to be concerned about the possible side effects, particularly jaw disintegration. Per my research, this doesn't usually occur until after significant dental procedures and the risk increases with longer term use. (I've had 4 root canals over an extended number of years-ugh!)My last bone density test in Dec. 2022 fell in the osteopenia range. I have started a program called OsteoStrong which is designed to improve bone density. Also, since retiring I walk 3-5 miles at least 5 days per week and do strength training exercises at [facility]. I continue to take 1 calcium chew (600 mg) daily and I eat green leafy veggies (e.g., collards, spinach) and eat [place] yogurt and some cheese and use milk in my coffee. I also started eating prunes, at your suggestion. I've never had a fracture, nor has my 67 year old  mother who has osteoporosis, thank goodness. Do you think it's reasonable to at least take a break from Prolia? Thanks for your advice. (Please bill my insurance company, as appropriate, for your consideration of this issue.) Lolita Lenz  P.S. Hope your daughter's recovery is continuing to go well.

## 2022-09-14 ENCOUNTER — Other Ambulatory Visit: Payer: Self-pay | Admitting: Family Medicine

## 2022-09-26 ENCOUNTER — Ambulatory Visit (INDEPENDENT_AMBULATORY_CARE_PROVIDER_SITE_OTHER): Payer: Medicare Other | Admitting: *Deleted

## 2022-09-26 VITALS — BP 139/68 | HR 82 | Ht 62.0 in | Wt 128.2 lb

## 2022-09-26 DIAGNOSIS — Z0000 Encounter for general adult medical examination without abnormal findings: Secondary | ICD-10-CM | POA: Diagnosis not present

## 2022-09-26 NOTE — Patient Instructions (Signed)
Yolanda Hernandez , Thank you for taking time to come for your Medicare Wellness Visit. I appreciate your ongoing commitment to your health goals. Please review the following plan we discussed and let me know if I can assist you in the future.   These are the goals we discussed:  Goals      Patient Stated     Exercise more consistently        This is a list of the screening recommended for you and due dates:  Health Maintenance  Topic Date Due   COVID-19 Vaccine (7 - 2023-24 season) 07/14/2022   Medicare Annual Wellness Visit  09/27/2023   Mammogram  07/04/2024   DTaP/Tdap/Td vaccine (3 - Td or Tdap) 08/22/2028   Colon Cancer Screening  07/27/2032   Pneumonia Vaccine  Completed   Flu Shot  Completed   DEXA scan (bone density measurement)  Completed   Hepatitis C Screening: USPSTF Recommendation to screen - Ages 14-79 yo.  Completed   Zoster (Shingles) Vaccine  Completed   HPV Vaccine  Aged Out     Next appointment: Follow up in one year for your annual wellness visit.   Preventive Care 67 Years and Older, Female Preventive care refers to lifestyle choices and visits with your health care provider that can promote health and wellness. What does preventive care include? A yearly physical exam. This is also called an annual well check. Dental exams once or twice a year. Routine eye exams. Ask your health care provider how often you should have your eyes checked. Personal lifestyle choices, including: Daily care of your teeth and gums. Regular physical activity. Eating a healthy diet. Avoiding tobacco and drug use. Limiting alcohol use. Practicing safe sex. Taking low-dose aspirin every day. Taking vitamin and mineral supplements as recommended by your health care provider. What happens during an annual well check? The services and screenings done by your health care provider during your annual well check will depend on your age, overall health, lifestyle risk factors, and  family history of disease. Counseling  Your health care provider may ask you questions about your: Alcohol use. Tobacco use. Drug use. Emotional well-being. Home and relationship well-being. Sexual activity. Eating habits. History of falls. Memory and ability to understand (cognition). Work and work Statistician. Reproductive health. Screening  You may have the following tests or measurements: Height, weight, and BMI. Blood pressure. Lipid and cholesterol levels. These may be checked every 5 years, or more frequently if you are over 66 years old. Skin check. Lung cancer screening. You may have this screening every year starting at age 4 if you have a 30-pack-year history of smoking and currently smoke or have quit within the past 15 years. Fecal occult blood test (FOBT) of the stool. You may have this test every year starting at age 65. Flexible sigmoidoscopy or colonoscopy. You may have a sigmoidoscopy every 5 years or a colonoscopy every 10 years starting at age 25. Hepatitis C blood test. Hepatitis B blood test. Sexually transmitted disease (STD) testing. Diabetes screening. This is done by checking your blood sugar (glucose) after you have not eaten for a while (fasting). You may have this done every 1-3 years. Bone density scan. This is done to screen for osteoporosis. You may have this done starting at age 3. Mammogram. This may be done every 1-2 years. Talk to your health care provider about how often you should have regular mammograms. Talk with your health care provider about your test results, treatment  options, and if necessary, the need for more tests. Vaccines  Your health care provider may recommend certain vaccines, such as: Influenza vaccine. This is recommended every year. Tetanus, diphtheria, and acellular pertussis (Tdap, Td) vaccine. You may need a Td booster every 10 years. Zoster vaccine. You may need this after age 67. Pneumococcal 13-valent conjugate  (PCV13) vaccine. One dose is recommended after age 27. Pneumococcal polysaccharide (PPSV23) vaccine. One dose is recommended after age 4. Talk to your health care provider about which screenings and vaccines you need and how often you need them. This information is not intended to replace advice given to you by your health care provider. Make sure you discuss any questions you have with your health care provider. Document Released: 10/29/2015 Document Revised: 06/21/2016 Document Reviewed: 08/03/2015 Elsevier Interactive Patient Education  2017 [place] Prevention in the Home Falls can cause injuries. They can happen to people of all ages. There are many things you can do to make your home safe and to help prevent falls. What can I do on the outside of my home? Regularly fix the edges of walkways and driveways and fix any cracks. Remove anything that might make you trip as you walk through a door, such as a raised step or threshold. Trim any bushes or trees on the path to your home. Use bright outdoor lighting. Clear any walking paths of anything that might make someone trip, such as rocks or tools. Regularly check to see if handrails are loose or broken. Make sure that both sides of any steps have handrails. Any raised decks and porches should have guardrails on the edges. Have any leaves, snow, or ice cleared regularly. Use sand or salt on walking paths during winter. Clean up any spills in your garage right away. This includes oil or grease spills. What can I do in the bathroom? Use night lights. Install grab bars by the toilet and in the tub and shower. Do not use towel bars as grab bars. Use non-skid mats or decals in the tub or shower. If you need to sit down in the shower, use a plastic, non-slip stool. Keep the floor dry. Clean up any water that spills on the floor as soon as it happens. Remove soap buildup in the tub or shower regularly. Attach bath mats securely with  double-sided non-slip rug tape. Do not have throw rugs and other things on the floor that can make you trip. What can I do in the bedroom? Use night lights. Make sure that you have a light by your bed that is easy to reach. Do not use any sheets or blankets that are too big for your bed. They should not hang down onto the floor. Have a firm chair that has side arms. You can use this for support while you get dressed. Do not have throw rugs and other things on the floor that can make you trip. What can I do in the kitchen? Clean up any spills right away. Avoid walking on wet floors. Keep items that you use a lot in easy-to-reach places. If you need to reach something above you, use a strong step stool that has a grab bar. Keep electrical cords out of the way. Do not use floor polish or wax that makes floors slippery. If you must use wax, use non-skid floor wax. Do not have throw rugs and other things on the floor that can make you trip. What can I do with my stairs? Do not  leave any items on the stairs. Make sure that there are handrails on both sides of the stairs and use them. Fix handrails that are broken or loose. Make sure that handrails are as long as the stairways. Check any carpeting to make sure that it is firmly attached to the stairs. Fix any carpet that is loose or worn. Avoid having throw rugs at the top or bottom of the stairs. If you do have throw rugs, attach them to the floor with carpet tape. Make sure that you have a light switch at the top of the stairs and the bottom of the stairs. If you do not have them, ask someone to add them for you. What else can I do to help prevent falls? Wear shoes that: Do not have high heels. Have rubber bottoms. Are comfortable and fit you well. Are closed at the toe. Do not wear sandals. If you use a stepladder: Make sure that it is fully opened. Do not climb a closed stepladder. Make sure that both sides of the stepladder are locked  into place. Ask someone to hold it for you, if possible. Clearly mark and make sure that you can see: Any grab bars or handrails. First and last steps. Where the edge of each step is. Use tools that help you move around (mobility aids) if they are needed. These include: Canes. Walkers. Scooters. Crutches. Turn on the lights when you go into a dark area. Replace any light bulbs as soon as they burn out. Set up your furniture so you have a clear path. Avoid moving your furniture around. If any of your floors are uneven, fix them. If there are any pets around you, be aware of where they are. Review your medicines with your doctor. Some medicines can make you feel dizzy. This can increase your chance of falling. Ask your doctor what other things that you can do to help prevent falls. This information is not intended to replace advice given to you by your health care provider. Make sure you discuss any questions you have with your health care provider. Document Released: 07/29/2009 Document Revised: 03/09/2016 Document Reviewed: 11/06/2014 Elsevier Interactive Patient Education  2017 [employer].

## 2022-09-26 NOTE — Progress Notes (Signed)
Subjective:   Yolanda Hernandez is a 66 y.o. female who presents for Medicare Annual (Subsequent) preventive examination.  Review of Systems    Defer to PCP Cardiac Risk Factors include: advanced age (>23mn, >>72women);dyslipidemia     Objective:    Today's Vitals   09/26/22 0911  BP: 139/68  Pulse: 82  Weight: 128 lb 3.2 oz (58.2 kg)  Height: '5\' 2"'$  (1.575 m)   Body mass index is 23.45 kg/m.     09/26/2022    9:13 AM 09/20/2021    9:06 AM 05/14/2017    4:25 PM 03/12/2015   10:20 AM  Advanced Directives  Does Patient Have a Medical Advance Directive? Yes Yes Yes Yes  Type of AParamedicof [place] will [place]ount [place]rest[place] will [place] will [place] will  Does patient want to make changes to medical advance directive? No - Patient declined   Yes - information given  Copy of [place] Chart? Yes - validated most recent copy scanned in chart (See row information) Yes - validated most recent copy scanned in chart (See row information)  No - copy requested    Current Medications (verified) Outpatient Encounter Medications as of 09/26/2022  Medication Sig   Calcium-Vitamin D-Vitamin K (CALCIUM SOFT CHEWS PO) Take 1 each by mouth daily. Unknown strength   Multiple Vitamins-Minerals (PRESERVISION AREDS 2+MULTI VIT PO) Take 1 tablet by mouth in the morning and at bedtime.   Probiotic Product (PROBIOTIC PO) Take 1 tablet by mouth daily.   rosuvastatin (CRESTOR) 10 MG tablet Take 1 tablet (10 mg total) by mouth daily.   tretinoin (RETIN-A) 0.05 % cream APPLY TOPICALLY AT BEDTIME   [DISCONTINUED] denosumab (PROLIA) 60 MG/ML SOSY injection INJECT '60MG'$  SUBCUTANEOUSLY  EVERY 6 MONTHS (Patient taking differently: Inject 60 mg into the skin every 6 (six) months. INJECT '60MG'$  SUBCUTANEOUSLY  EVERY 6 MONTHS)   No facility-administered encounter medications on file as of  09/26/2022.    Allergies (verified) Iodine and Sulfa antibiotics   History: Past Medical History:  Diagnosis Date   Cerumen impaction    chronic   Cervical cancer screening 03/12/2015   Menarche at 10 Regular and moderate flow  history of abnormal pap in past, bx and cryo once many years ago, normal since. Last 2011 and normal G0P0, s/p No history of abnormal MGM Last in 2015, no breast concerns  No concerns today Biopsy, cervical: gyn surgeries Menopause at 89ish   Conflict between patient and family 03/26/2021   Dysmenorrhea    Heart murmur 03/26/2021   Hematuria 07/31/2017   HPV in female 2019   Hyperglycemia 03/19/2013   Hypertension    Muscle cramping 03/26/2021   Osteopenia    Osteoporosis 04/11/2010   Qualifier: Diagnosis of  By: YWynona Luna   Other and unspecified hyperlipidemia 03/19/2013   Right calf pain 03/22/2021   Skin cancer    Skin cancer 07/14/2016   Uterine fibroid    Past Surgical History:  Procedure Laterality Date   BREAST SURGERY     biopsy left   COLPOSCOPY     GYNECOLOGIC CRYOSURGERY     MYRINGOPLASTY  1972   TONSILLECTOMY     Family History  Problem Relation Age of Onset   Osteoporosis Mother    Hyperlipidemia Mother    Hypertension Mother    Macular degeneration Mother    Arthritis Mother    Hearing loss  Mother    Vision loss Mother    Heart disease Father    Diabetes Father    Hyperlipidemia Father    Hypertension Father    Asthma Father    Diabetes Sister    Heart disease Sister        s/p cardiac stent   Hyperlipidemia Sister    Hypertension Sister    Osteoporosis Sister    Colon cancer Maternal Uncle    Heart disease Paternal Uncle    Arthritis Maternal Grandmother    Vision loss Maternal Grandmother    Heart disease Paternal Grandfather    Kidney disease Other    Alcohol abuse Other    Esophageal cancer Neg Hx    Stomach cancer Neg Hx    Rectal cancer Neg Hx    Social History   Socioeconomic History    Marital status: Married    Spouse name: Not on file   Number of children: Not on file   Years of education: Not on file   Highest education level: Not on file  Occupational History   Not on file  Tobacco Use   Smoking status: Never   Smokeless tobacco: Never  Vaping Use   Vaping Use: Never used  Substance and Sexual Activity   Alcohol use: Yes    Comment: Less than 1 glass of wine or beer per week   Drug use: Never    Comment: older than 44, less than 5   Sexual activity: Yes    Birth control/protection: Post-menopausal  Other Topics Concern   Not on file  Social History Narrative   Not on file   Social Determinants of Health   Financial Resource Strain: Low Risk  (09/20/2021)   Overall Financial Resource Strain (CARDIA)    Difficulty of Paying Living Expenses: Not hard at all  Food Insecurity: No Food Insecurity (09/26/2022)   Hunger Vital Sign    Worried About Running Out of Food in the Last Year: Never true    Ran Out of Food in the Last Year: Never true  Transportation Needs: No Transportation Needs (09/26/2022)   PRAPARE - Hydrologist (Medical): No    Lack of Transportation (Non-Medical): No  Physical Activity: Sufficiently Active (09/20/2021)   Exercise Vital Sign    Days of Exercise per Week: 6 days    Minutes of Exercise per Session: 60 min  Stress: No Stress Concern Present (09/20/2021)   [place]    Feeling of Stress : Not at all  Social Connections: Moderately Isolated (09/20/2021)   Social Connection and Isolation Panel [NHANES]    Frequency of Communication with Friends and Family: More than three times a week    Frequency of Social Gatherings with Friends and Family: More than three times a week    Attends Religious Services: Never    Marine scientist or Organizations: No    Attends Music therapist: Never    Marital Status: Married     Tobacco Counseling Counseling given: Not Answered   Clinical Intake:  Pre-visit preparation completed: Yes  Pain : No/denies pain     Diabetes: No  How often do you need to have someone help you when you read instructions, pamphlets, or other written materials from your doctor or pharmacy?: 1 - Never  Activities of Daily Living    09/26/2022    9:17 AM  In your present state of health, do  you have any difficulty performing the following activities:  Hearing? 1  Comment wears hearing aids  Vision? 0  Difficulty concentrating or making decisions? 0  Walking or climbing stairs? 0  Dressing or bathing? 0  Doing errands, shopping? 0  Preparing Food and eating ? N  Using the Toilet? N  In the past six months, have you accidently leaked urine? N  Do you have problems with loss of bowel control? N  Managing your Medications? N  Managing your Finances? N  Housekeeping or managing your Housekeeping? N    Patient Care Team: Mosie Lukes, MD as PCP - General (Family Medicine) Salvadore Dom, MD as Consulting Physician (Obstetrics and Gynecology)  Indicate any recent Medical Services you may have received from other than [facility] providers in the past year (date may be approximate).     Assessment:   This is a routine wellness examination for [place].  Hearing/Vision screen No results found.  Dietary issues and exercise activities discussed: Current Exercise Habits: Home exercise routine, Type of exercise: strength training/weights;treadmill;walking, Time (Minutes): 45, Frequency (Times/Week): 3, Weekly Exercise (Minutes/Week): 135, Intensity: Mild, Exercise limited by: None identified   Goals Addressed   None    Depression Screen    09/26/2022    9:14 AM 03/23/2022    9:19 AM 09/20/2021    9:09 AM 03/22/2021    2:52 PM 08/22/2018    8:59 AM 07/14/2016    8:58 AM  PHQ 2/9 Scores  PHQ - 2 Score 0 0 0 0 0 0  PHQ- 9 Score     1     Fall Risk    09/26/2022     9:13 AM 03/23/2022    9:19 AM 09/20/2021    9:07 AM 03/22/2021    2:52 PM 07/14/2016    8:58 AM  [place] in the past year? 0 0 0 0 No  Number falls in past yr: 0 0 0 0   Injury with Fall? 0 0 0 0   Risk for fall due to : No Fall Risks No Fall Risks     Follow up Follow up appointment Falls evaluation completed Falls prevention discussed      FALL RISK PREVENTION PERTAINING TO THE HOME:  Any stairs in or around the home? Yes  If so, are there any without handrails? No  Home free of loose throw rugs in walkways, pet beds, electrical cords, etc? Yes  Adequate lighting in your home to reduce risk of falls? Yes   ASSISTIVE DEVICES UTILIZED TO PREVENT FALLS:  Life alert? No  Use of a cane, walker or w/c? No  Grab bars in the bathroom? Yes  Shower chair or bench in shower? Yes  Elevated toilet seat or a handicapped toilet? No   TIMED UP AND GO:  Was the test performed? Yes .  Length of time to ambulate 10 feet: 5 sec.   Gait steady and fast without use of assistive device  Cognitive Function:        09/26/2022    9:20 AM  6CIT Screen  What Year? 0 points  What month? 0 points  What time? 0 points  Count back from 20 0 points  Months in reverse 0 points  Repeat phrase 0 points  Total Score 0 points    Immunizations Immunization History  Administered Date(s) Administered   Fluad Quad(high Dose 65+) 07/16/2020, 07/27/2021, 07/28/2022   Influenza Inj Mdck Quad Pf 07/25/2019   Influenza,inj,Quad  PF,6+ Mos 07/07/2013, 07/09/2015, 07/14/2016, 07/31/2017, 07/08/2018   PFIZER(Purple Top)SARS-COV-2 Vaccination 11/10/2019, 12/01/2019, 07/23/2020, 02/14/2021, 05/19/2022   PNEUMOCOCCAL CONJUGATE-20 11/16/2021   Pfizer Covid-19 Vaccine Bivalent Booster 3yr & up 08/04/2021   Pneumococcal Conjugate-13 03/12/2015   Pneumococcal Polysaccharide-23 07/14/2016   Respiratory Syncytial Virus Vaccine,Recomb Aduvanted(Arexvy) 08/18/2022   Td 10/14/2008   Tdap 08/22/2018    Zoster Recombinat (Shingrix) 12/11/2017, 02/12/2018   Zoster, Live 10/23/2014    TDAP status: Up to date  Flu Vaccine status: Up to date  Pneumococcal vaccine status: Up to date  Covid-19 vaccine status: Information provided on how to obtain vaccines.   Qualifies for Shingles Vaccine? Yes   Zostavax completed Yes   Shingrix Completed?: Yes  Screening Tests Health Maintenance  Topic Date Due   COVID-19 Vaccine (7 - 2023-24 season) 07/14/2022   Medicare Annual Wellness (AWV)  09/20/2022   MAMMOGRAM  07/04/2024   DTaP/Tdap/Td (3 - Td or Tdap) 08/22/2028   COLONOSCOPY (Pts 45-440yrInsurance coverage will need to be confirmed)  07/27/2032   Pneumonia Vaccine 6541Years old  Completed   INFLUENZA VACCINE  Completed   DEXA SCAN  Completed   Hepatitis C Screening  Completed   Zoster Vaccines- Shingrix  Completed   HPV VACCINES  Aged Out    Health Maintenance  Health Maintenance Due  Topic Date Due   COVID-19 Vaccine (7 - 2023-24 season) 07/14/2022   Medicare Annual Wellness (AWV)  09/20/2022    Colorectal cancer screening: Type of screening: Colonoscopy. Completed 07/27/22. Repeat every 10 years  Mammogram status: Completed 07/04/22. Repeat every year  Bone Density status: Completed 10/05/21. Results reflect: Bone density results: OSTEOPENIA. Repeat every 2 years.  Lung Cancer Screening: (Low Dose CT Chest recommended if Age 67-80ears, 30 pack-year currently smoking OR have quit w/in 15years.) does not qualify.   Additional Screening:  Hepatitis C Screening: does qualify; Completed 07/14/16  Vision Screening: Recommended annual ophthalmology exams for early detection of glaucoma and other disorders of the eye. Is the patient up to date with their annual eye exam?  Yes  Who is the provider or what is the name of the office in which the patient attends annual eye exams? Dr. KaMaggie Schwalbef pt is not established with a provider, would they like to be referred to a provider  to establish care? No .   Dental Screening: Recommended annual dental exams for proper oral hygiene  Community Resource Referral / Chronic Care Management: CRR required this visit?  No   CCM required this visit?  No      Plan:     I have personally reviewed and noted the following in the patient's chart:   Medical and social history Use of alcohol, tobacco or illicit drugs  Current medications and supplements including opioid prescriptions. Patient is not currently taking opioid prescriptions. Functional ability and status Nutritional status Physical activity Advanced directives List of other physicians Hospitalizations, surgeries, and ER visits in previous 12 months Vitals Screenings to include cognitive, depression, and falls Referrals and appointments  In addition, I have reviewed and discussed with patient certain preventive protocols, quality metrics, and best practice recommendations. A written personalized care plan for preventive services as well as general preventive health recommendations were provided to patient.     [place]ris [place] 09/26/2022   Nurse Notes: None

## 2022-11-13 DIAGNOSIS — S22000A Wedge compression fracture of unspecified thoracic vertebra, initial encounter for closed fracture: Secondary | ICD-10-CM | POA: Insufficient documentation

## 2022-11-16 ENCOUNTER — Ambulatory Visit (INDEPENDENT_AMBULATORY_CARE_PROVIDER_SITE_OTHER): Payer: Medicare Other | Admitting: Family

## 2022-11-16 ENCOUNTER — Other Ambulatory Visit (HOSPITAL_BASED_OUTPATIENT_CLINIC_OR_DEPARTMENT_OTHER): Payer: Self-pay

## 2022-11-16 ENCOUNTER — Encounter: Payer: Self-pay | Admitting: Family

## 2022-11-16 VITALS — BP 144/86 | HR 95 | Ht 62.0 in

## 2022-11-16 DIAGNOSIS — M62838 Other muscle spasm: Secondary | ICD-10-CM

## 2022-11-16 DIAGNOSIS — M546 Pain in thoracic spine: Secondary | ICD-10-CM | POA: Diagnosis not present

## 2022-11-16 MED ORDER — METHOCARBAMOL 500 MG PO TABS
500.0000 mg | ORAL_TABLET | Freq: Three times a day (TID) | ORAL | 0 refills | Status: DC | PRN
  Filled 2022-11-16: qty 20, 7d supply, fill #0

## 2022-11-16 MED ORDER — MELOXICAM 15 MG PO TABS
15.0000 mg | ORAL_TABLET | Freq: Every day | ORAL | 0 refills | Status: DC
  Filled 2022-11-16: qty 20, 20d supply, fill #0

## 2022-11-16 NOTE — Progress Notes (Signed)
Yolanda Hernandez is a 67 y.o. female with the following history as recorded in [employer]:  Patient Active Problem List   Diagnosis Date Noted   History of COVID-19 03/23/2022   Aortic insufficiency 06/08/2021   Ascending aortic aneurysm ([place]) 06/08/2021   Hypertension 82/95/6213   Conflict between patient and family 03/26/2021   Heart murmur 03/26/2021   HPV in female 2019   Hematuria 07/31/2017   Asymmetric SNHL (sensorineural hearing loss) 11/29/2015   Subjective tinnitus of left ear 11/29/2015   Cervical cancer screening 03/12/2015   Contact dermatitis 07/07/2013   Hyperlipidemia, mixed 03/19/2013   Hyperglycemia 03/19/2013   Osteopenia 03/19/2013   Preventative health care 09/08/2011   Osteoporosis 04/11/2010   FIBROIDS, UTERUS 03/07/2010   ELEVATED BLOOD PRESSURE WITHOUT DIAGNOSIS OF HYPERTENSION 03/07/2010   SKIN CANCER, HX OF 03/07/2010    Current Outpatient Medications  Medication Sig Dispense Refill   Calcium-Vitamin D-Vitamin K (CALCIUM SOFT CHEWS PO) Take 1 each by mouth daily. Unknown strength     meloxicam (MOBIC) 15 MG tablet Take 1 tablet (15 mg total) by mouth daily. 20 tablet 0   methocarbamol (ROBAXIN) 500 MG tablet Take 1 tablet (500 mg total) by mouth every 8 (eight) hours as needed for muscle spasms. 20 tablet 0   Multiple Vitamins-Minerals (PRESERVISION AREDS 2+MULTI VIT PO) Take 1 tablet by mouth in the morning and at bedtime.     Probiotic Product (PROBIOTIC PO) Take 1 tablet by mouth daily.     rosuvastatin (CRESTOR) 10 MG tablet Take 1 tablet (10 mg total) by mouth daily. 90 tablet 3   tretinoin (RETIN-A) 0.05 % cream APPLY TOPICALLY AT BEDTIME 45 g 5   No current facility-administered medications for this visit.    Allergies: Iodine and Sulfa antibiotics  Past Medical History:  Diagnosis Date   Cerumen impaction    chronic   Cervical cancer screening 03/12/2015   Menarche at 10 Regular and moderate flow  history of abnormal pap in past, bx and  cryo once many years ago, normal since. Last 2011 and normal G0P0, s/p No history of abnormal MGM Last in 2015, no breast concerns  No concerns today Biopsy, cervical: gyn surgeries Menopause at 78 ish   Conflict between patient and family 03/26/2021   Dysmenorrhea    Heart murmur 03/26/2021   Hematuria 07/31/2017   HPV in female 2019   Hyperglycemia 03/19/2013   Hypertension    Muscle cramping 03/26/2021   Osteopenia    Osteoporosis 04/11/2010   Qualifier: Diagnosis of  By: Wynona Luna    Other and unspecified hyperlipidemia 03/19/2013   Right calf pain 03/22/2021   Skin cancer    Skin cancer 07/14/2016   Uterine fibroid     Past Surgical History:  Procedure Laterality Date   BREAST SURGERY     biopsy left   COLPOSCOPY     GYNECOLOGIC CRYOSURGERY     MYRINGOPLASTY  1972   TONSILLECTOMY      Family History  Problem Relation Age of Onset   Osteoporosis Mother    Hyperlipidemia Mother    Hypertension Mother    Macular degeneration Mother    Arthritis Mother    Hearing loss Mother    Vision loss Mother    Heart disease Father    Diabetes Father    Hyperlipidemia Father    Hypertension Father    Asthma Father    Diabetes Sister    Heart disease Sister  s/p cardiac stent   Hyperlipidemia Sister    Hypertension Sister    Osteoporosis Sister    Colon cancer Maternal Uncle    Heart disease Paternal Uncle    Arthritis Maternal Grandmother    Vision loss Maternal Grandmother    Heart disease Paternal Grandfather    Kidney disease Other    Alcohol abuse Other    Esophageal cancer Neg Hx    Stomach cancer Neg Hx    Rectal cancer Neg Hx     Social History   Tobacco Use   Smoking status: Never   Smokeless tobacco: Never  Substance Use Topics   Alcohol use: Yes    Comment: Less than 1 glass of wine or beer per week    Subjective:   Low back pain x 2 days; injured back while exercising; felt pain go through "mid back" while exercising; has been  applying heat and taking Tylenol; more noticeable with certain movements; does feel that movement to get in/out of her car to come to this appointment made her symptoms worse;  Prefers not to take NSAIDs but will consider if needed;   Objective:  Vitals:   11/16/22 1457  BP: (!) 144/86  Pulse: 95  SpO2: 96%  Height: '5\' 2"'$  (1.575 m)    General: Well developed, well nourished, in no acute distress  Skin : Warm and dry.  Head: Normocephalic and atraumatic  Lungs: Respirations unlabored;  Musculoskeletal: No deformities; no active joint inflammation  Extremities: No edema, cyanosis, clubbing  Vessels: Symmetric bilaterally  Neurologic: Alert and oriented; speech intact; face symmetrical; moves all extremities well; CNII-XII intact without focal deficit   Assessment:  1. Acute midline thoracic back pain   2. Muscle spasm     Plan:  Rx for Mobic and Robaxin; continue to rest, apply heat and follow up worse, no better.   No follow-ups on file.  No orders of the defined types were placed in this encounter.   Requested Prescriptions   Signed Prescriptions Disp Refills   meloxicam (MOBIC) 15 MG tablet 20 tablet 0    Sig: Take 1 tablet (15 mg total) by mouth daily.   methocarbamol (ROBAXIN) 500 MG tablet 20 tablet 0    Sig: Take 1 tablet (500 mg total) by mouth every 8 (eight) hours as needed for muscle spasms.

## 2022-11-17 ENCOUNTER — Other Ambulatory Visit (HOSPITAL_BASED_OUTPATIENT_CLINIC_OR_DEPARTMENT_OTHER): Payer: Self-pay

## 2022-11-20 ENCOUNTER — Ambulatory Visit: Payer: Medicare Other | Admitting: Obstetrics and Gynecology

## 2022-12-10 ENCOUNTER — Other Ambulatory Visit: Payer: Self-pay | Admitting: Cardiology

## 2022-12-11 NOTE — Telephone Encounter (Signed)
Refill to pharmacy 

## 2022-12-12 ENCOUNTER — Other Ambulatory Visit: Payer: Self-pay | Admitting: Family

## 2022-12-12 ENCOUNTER — Encounter: Payer: Self-pay | Admitting: Family

## 2022-12-12 ENCOUNTER — Ambulatory Visit (HOSPITAL_BASED_OUTPATIENT_CLINIC_OR_DEPARTMENT_OTHER)
Admission: RE | Admit: 2022-12-12 | Discharge: 2022-12-12 | Disposition: A | Discharge status: Home / Self Care | Payer: Medicare Other | Source: Ambulatory Visit | Attending: Family | Admitting: Family

## 2022-12-12 DIAGNOSIS — M549 Dorsalgia, unspecified: Secondary | ICD-10-CM

## 2022-12-12 NOTE — Progress Notes (Signed)
lw 

## 2022-12-14 NOTE — Progress Notes (Signed)
67 y.o. G0P0000 Married White or Caucasian Not Hispanic or Latino female here for annual exam.  No vaginal bleeding. No bowel or bladder issues. She had a few episodes of mild urge incontinence in the fall, none since.  Sexually active, no pain.   She states she has a compression fracture in her back in 1/23. She was on prolia, last shot was in 4/23. She went off the prolia to take a break. Her last DEXA was in 12/22 and showed osteopenia, improved from osteoporosis.     No LMP recorded. Patient is postmenopausal.          Sexually active: Yes.    The current method of family planning is post menopausal status.    Exercising: No.   Work out classes  Smoker:  no  Health Maintenance: Pap: 09-30-20 neg HPV HR neg, 09-30-19 neg HPV HR neg,08/22/2018 positive HPV, neg 16/18/45 History of abnormal Pap:  Yes MMG:  07/04/22 Bi-rads 1 neg  BMD:  09/20/21 Osteopenia, on prolia (with primary), she was due to for her last shot in the Fall, didn't get it.  Colonoscopy:07/27/22 normal follow up 10 years  TDaP:  08/22/2018 Gardasil: N/A   reports that she has never smoked. She has never used smokeless tobacco. She reports current alcohol use. She reports that she does not use drugs. She is a retired Electrical engineer, working part time. Husband has sarcoidosis.    Past Medical History:  Diagnosis Date   Cerumen impaction    chronic   Cervical cancer screening 03/12/2015   Menarche at 10 Regular and moderate flow  history of abnormal pap in past, bx and cryo once many years ago, normal since. Last 2011 and normal G0P0, s/p No history of abnormal MGM Last in 2015, no breast concerns  No concerns today Biopsy, cervical: gyn surgeries Menopause at 43 ish   Conflict between patient and family 03/26/2021   Dysmenorrhea    Heart murmur 03/26/2021   Hematuria 07/31/2017   HPV in female 2019   Hyperglycemia 03/19/2013   Hypertension    Muscle cramping 03/26/2021   Osteopenia    Osteoporosis 04/11/2010    Qualifier: Diagnosis of  By: Wynona Luna    Other and unspecified hyperlipidemia 03/19/2013   Right calf pain 03/22/2021   Skin cancer    Skin cancer 07/14/2016   Uterine fibroid     Past Surgical History:  Procedure Laterality Date   BREAST SURGERY     biopsy left   COLPOSCOPY     GYNECOLOGIC CRYOSURGERY     MYRINGOPLASTY  1972   TONSILLECTOMY      Current Outpatient Medications  Medication Sig Dispense Refill   Calcium-Vitamin D-Vitamin K (CALCIUM SOFT CHEWS PO) Take 1 each by mouth daily. Unknown strength     meloxicam (MOBIC) 15 MG tablet Take 1 tablet (15 mg total) by mouth daily. 20 tablet 0   methocarbamol (ROBAXIN) 500 MG tablet Take 1 tablet (500 mg total) by mouth every 8 (eight) hours as needed for muscle spasms. 20 tablet 0   Multiple Vitamins-Minerals (PRESERVISION AREDS 2+MULTI VIT PO) Take 1 tablet by mouth in the morning and at bedtime.     Probiotic Product (PROBIOTIC PO) Take 1 tablet by mouth daily.     rosuvastatin (CRESTOR) 10 MG tablet TAKE ONE TABLET BY MOUTH DAILY 90 tablet 3   tretinoin (RETIN-A) 0.05 % cream APPLY TOPICALLY AT BEDTIME 45 g 5   No current facility-administered medications for this visit.  Family History  Problem Relation Age of Onset   Osteoporosis Mother    Hyperlipidemia Mother    Hypertension Mother    Macular degeneration Mother    Arthritis Mother    Hearing loss Mother    Vision loss Mother    Heart disease Father    Diabetes Father    Hyperlipidemia Father    Hypertension Father    Asthma Father    Diabetes Sister    Heart disease Sister        s/p cardiac stent   Hyperlipidemia Sister    Hypertension Sister    Osteoporosis Sister    Colon cancer Maternal Uncle    Heart disease Paternal Uncle    Arthritis Maternal Grandmother    Vision loss Maternal Grandmother    Heart disease Paternal Grandfather    Kidney disease Other    Alcohol abuse Other    Esophageal cancer Neg Hx    Stomach cancer Neg Hx     Rectal cancer Neg Hx     Review of Systems  All other systems reviewed and are negative.   Exam:   There were no vitals taken for this visit.  Weight change: '@WEIGHTCHANGE'$ @ Height:      Ht Readings from Last 3 Encounters:  11/16/22 '5\' 2"'$  (1.575 m)  09/26/22 '5\' 2"'$  (1.575 m)  07/27/22 '5\' 2"'$  (1.575 m)    General appearance: alert, cooperative and appears stated age Head: Normocephalic, without obvious abnormality, atraumatic Neck: no adenopathy, supple, symmetrical, trachea midline and thyroid normal to inspection and palpation Breasts: normal appearance, no masses or tenderness Abdomen: soft, non-tender; non distended,  no masses,  no organomegaly Extremities: extremities normal, atraumatic, no cyanosis or edema Skin: Skin color, texture, turgor normal. No rashes or lesions Lymph nodes: Cervical, supraclavicular, and axillary nodes normal. No abnormal inguinal nodes palpated Neurologic: Grossly normal   Pelvic: External genitalia:  no lesions              Urethra:  normal appearing urethra with no masses, tenderness or lesions              Bartholins and Skenes: normal                 Vagina: normal appearing vagina with normal color and discharge, no lesions              Cervix: no lesions               Bimanual Exam:  Uterus:   retroverted, 8 week sized (stable)              Adnexa: no mass, fullness, tenderness               Rectovaginal: Confirms               Anus:  normal sphincter tone, no lesions  Gae Dry, CMA chaperoned for the exam.  1. Encounter for breast and pelvic examination Discussed breast self exam Discussed calcium and vit D intake Mammogram, colonoscopy, labs UTD  2. Screening for cervical cancer - Cytology - PAP  3. History of osteoporosis She improved with prolia, didn't get her last prolia which was due in 10/23  -We discussed the high risk of decline in vertebral bone density with stopping prolia and the increased risk of vertebral fractures  after coming off of prolia. We reviewed the recommendation for continuation of prolia indefinitely. -Will refer to Endocrinology for further counseling and management In the meantime will  start Fosamax to try and prevent further fractures.  - alendronate (FOSAMAX) 70 MG tablet; Take 1 tablet (70 mg total) by mouth every 7 (seven) days. Take first thing in am with 6 oz. Water.  Be upright after taking.  Eat nothing for one hour.  Dispense: 12 tablet; Refill: 3 -DEXA ordered at [facility] -Normal calcium/vit d and creatinine within the last year.  She is getting calcium and vit d  4. History of vertebral compression fracture - alendronate (FOSAMAX) 70 MG tablet; Take 1 tablet (70 mg total) by mouth every 7 (seven) days. Take first thing in am with 6 oz. Water.  Be upright after taking.  Eat nothing for one hour.  Dispense: 12 tablet; Refill: 3   In addition to the breast and pelvic exam, over 30 minutes was spent in patient care regarding osteoporosis and vertebral fracture

## 2022-12-15 ENCOUNTER — Other Ambulatory Visit: Payer: Self-pay | Admitting: Family

## 2022-12-15 ENCOUNTER — Other Ambulatory Visit (HOSPITAL_COMMUNITY): Payer: Self-pay

## 2022-12-15 DIAGNOSIS — S22000A Wedge compression fracture of unspecified thoracic vertebra, initial encounter for closed fracture: Secondary | ICD-10-CM

## 2022-12-15 MED ORDER — TRAMADOL HCL 50 MG PO TABS: 50.0000 mg | ORAL_TABLET | Freq: Four times a day (QID) | ORAL | 0 refills | Status: AC | PRN

## 2022-12-19 ENCOUNTER — Ambulatory Visit (INDEPENDENT_AMBULATORY_CARE_PROVIDER_SITE_OTHER): Payer: Medicare Other | Admitting: Orthopedic Surgery

## 2022-12-19 VITALS — BP 162/80 | HR 103 | Ht 62.0 in | Wt 129.0 lb

## 2022-12-19 DIAGNOSIS — S22060A Wedge compression fracture of T7-T8 vertebra, initial encounter for closed fracture: Secondary | ICD-10-CM | POA: Diagnosis not present

## 2022-12-19 NOTE — Progress Notes (Signed)
Orthopedic Spine Surgery Office Note  Assessment: Patient is a 67 y.o. female with T8 compression fracture   Plan: -Explained that most compression fractures go on to heal with time.  No surgical intervention is usually needed for these fractures.  She does not need to wear a brace for this fracture.  She can be out of bed as tolerated. -No bending/lifting/twisting greater than 10 pounds for the first 3 months after injury -Patient has tried tramadol, Tylenol, muscle relaxer, activity modification -She is already being treated for osteoporosis -If she is doing symptomatically better at her next visit, will refer to physical therapy to start core strengthening -Patient should return to office in 6 weeks, x-rays at next visit: Apl/lateral thoracic   Patient expressed understanding of the plan and all questions were answered to the patient's satisfaction.   ___________________________________________________________________________   History:  Patient is a 67 y.o. female who presents today for thoracic spine.  Patient was doing an exercise and describes a lifting type exercise when she noted a pop and sharp pain in her mid back.  At first, she thought it was a muscle strain and thought it would get better.  She tried muscle relaxers and given time but it did not get better.  She eventually got an x-ray that showed a compression fracture.  Pain is in the mid back and sometimes radiates along the right chest wall.  It does not cross midline.  She does not get any numbness or tingling.  She has not noticed any unsteadiness with gait.  Has not noticed any lower extremity weakness.  Pain has been getting better as she has been taking it easier for the last week or so.   Weakness: Denies Symptoms of imbalance: Denies Paresthesias and numbness: Denies Bowel or bladder incontinence: Denies Saddle anesthesia: Denies  Treatments tried: Tylenol, tramadol, muscle relaxer, activity  modification  Review of systems: Denies fevers and chills, night sweats, unexplained weight loss.  Has had pain that wakes her at night and has had history of skin cancer  Past medical history: Hyperlipidemia Skin cancer Osteoporosis Aortic valve insufficiency  Allergies: Iodine, sulfa  Past surgical history:  None  Social history: Denies use of nicotine product (smoking, vaping, patches, smokeless) Alcohol use: Rare Denies recreational drug use   Physical Exam:  General: no acute distress, appears stated age Neurologic: alert, answering questions appropriately, following commands Respiratory: unlabored breathing on room air, symmetric chest rise Psychiatric: appropriate affect, normal cadence to speech   MSK (spine):  -Strength exam      Left  Right EHL    5/5  5/5 TA    5/5  5/5 GSC    5/5  5/5 Knee extension  5/5  5/5 Hip flexion   5/5  5/5  -Sensory exam    Sensation intact to light touch in L3-S1 nerve distributions of bilateral lower extremities  -Achilles DTR: 2/4 on the left, 2/4 on the right -Patellar tendon DTR: 2/4 on the left, 2/4 on the right  -Normal gait -Clonus: no beats bilaterally  -Left hip exam: No pain through range of motion -Right hip exam: No pain through range of motion  Imaging: XR of the thoracic spine from 12/12/2022 was independently reviewed and interpreted, showing T8 compression fracture with anterior height loss.  No other fracture seen.  No spondylolisthesis seen.   Patient name: Yolanda Hernandez Patient MRN: [id number] Date of visit: 12/19/22

## 2022-12-20 ENCOUNTER — Ambulatory Visit: Payer: Medicare Other | Attending: Cardiology | Admitting: Cardiology

## 2022-12-20 ENCOUNTER — Encounter: Payer: Self-pay | Admitting: Cardiology

## 2022-12-20 VITALS — BP 136/60 | HR 88 | Ht 62.5 in | Wt 125.0 lb

## 2022-12-20 DIAGNOSIS — I351 Nonrheumatic aortic (valve) insufficiency: Secondary | ICD-10-CM | POA: Diagnosis not present

## 2022-12-20 DIAGNOSIS — I10 Essential (primary) hypertension: Secondary | ICD-10-CM | POA: Diagnosis not present

## 2022-12-20 DIAGNOSIS — R0609 Other forms of dyspnea: Secondary | ICD-10-CM | POA: Diagnosis not present

## 2022-12-20 DIAGNOSIS — I7121 Aneurysm of the ascending aorta, without rupture: Secondary | ICD-10-CM | POA: Diagnosis not present

## 2022-12-20 MED ORDER — ROSUVASTATIN CALCIUM 10 MG PO TABS: 10.0000 mg | ORAL_TABLET | Freq: Every day | ORAL | 3 refills | Status: DC

## 2022-12-20 NOTE — Addendum Note (Signed)
Addended by: Jacobo Forest D on: 12/20/2022 03:19 PM   Modules accepted: Orders

## 2022-12-20 NOTE — Patient Instructions (Addendum)
Medication Instructions:  Your physician recommends that you continue on your current medications as directed. Please refer to the Current Medication list given to you today.  *If you need a refill on your cardiac medications before your next appointment, please call your pharmacy*   Lab Work: None Ordered If you have labs (blood work) drawn today and your tests are completely normal, you will receive your results only by: [place] (if you have MyChart) OR A paper copy in the mail If you have any lab test that is abnormal or we need to change your treatment, we will call you to review the results.   Testing/Procedures: Your physician has requested that you have an echocardiogram. Echocardiography is a painless test that uses sound waves to create images of your heart. It provides your doctor with information about the size and shape of your heart and how well your heart's chambers and valves are working. This procedure takes approximately one hour. There are no restrictions for this procedure. Please do NOT wear cologne, perfume, aftershave, or lotions (deodorant is allowed). Please arrive 15 minutes prior to your appointment time.    Follow-Up: At [facility], you and your health needs are our priority.  As part of our continuing mission to provide you with exceptional heart care, we have created designated Provider Care Teams.  These Care Teams include your primary Cardiologist (physician) and Advanced Practice Providers (APPs -  Physician Assistants and Nurse Practitioners) who all work together to provide you with the care you need, when you need it.  We recommend signing up for the patient portal called "MyChart".  Sign up information is provided on this After Visit Summary.  MyChart is used to connect with patients for Virtual Visits (Telemedicine).  Patients are able to view lab/test results, encounter notes, upcoming appointments, etc.  Non-urgent messages can be sent to your  provider as well.   To learn more about what you can do with MyChart, go to [email or web address].    Your next appointment:   7 month(s)- After Echocardiogram  The format for your next appointment:   In Person  Provider:   Jenne Campus, MD    Other Instructions NA

## 2022-12-20 NOTE — Progress Notes (Signed)
Cardiology Office Note:    Date:  12/20/2022   ID:  Yolanda Hernandez, DOB [birth year], MRN [id number]  PCP:  Mosie Lukes, MD  Cardiologist:  Jenne Campus, MD    Referring MD: Mosie Lukes, MD   No chief complaint on file. I have a broken back  History of Present Illness:    Yolanda Hernandez is a 67 y.o. female past medical history significant for moderate aortic insufficiency, normal ejection fraction, normal LV size, ascending aortic monitor is measuring 41 mm, essential hypertension, dyslipidemia. She comes today to months for follow-up.  Overall she is doing very well.  She denies have any chest pain tightness squeezing pressure burning chest no palpitations dizziness swelling of lower extremities she is however suffered from a compression fracture in her thoracic spine she was working out at [employer] and suddenly felt pain talked she simply pulled muscle but then x-ray revealed that there was a fracture conservative approach so far however there is some discussion about potentially doing kyphoplasty  Past Medical History:  Diagnosis Date   Cerumen impaction    chronic   Cervical cancer screening 03/12/2015   Menarche at 10 Regular and moderate flow  history of abnormal pap in past, bx and cryo once many years ago, normal since. Last 2011 and normal G0P0, s/p No history of abnormal MGM Last in 2015, no breast concerns  No concerns today Biopsy, cervical: gyn surgeries Menopause at 3 ish   Conflict between patient and family 03/26/2021   Dysmenorrhea    Heart murmur 03/26/2021   Hematuria 07/31/2017   HPV in female 2019   Hyperglycemia 03/19/2013   Hypertension    Muscle cramping 03/26/2021   Osteopenia    Osteoporosis 04/11/2010   Qualifier: Diagnosis of  By: Wynona Luna    Other and unspecified hyperlipidemia 03/19/2013   Preventative health care 09/08/2011   Right calf pain 03/22/2021   Skin cancer    Skin cancer 07/14/2016   Uterine fibroid     Past  Surgical History:  Procedure Laterality Date   BREAST SURGERY     biopsy left   COLPOSCOPY     GYNECOLOGIC CRYOSURGERY     MYRINGOPLASTY  1972   TONSILLECTOMY      Current Medications: Current Meds  Medication Sig   Calcium-Vitamin D-Vitamin K (CALCIUM SOFT CHEWS PO) Take 1 each by mouth daily. Unknown strength   methocarbamol (ROBAXIN) 500 MG tablet Take 1 tablet (500 mg total) by mouth every 8 (eight) hours as needed for muscle spasms.   Multiple Vitamins-Minerals (PRESERVISION AREDS 2+MULTI VIT PO) Take 1 tablet by mouth in the morning and at bedtime.   Probiotic Product (PROBIOTIC PO) Take 1 tablet by mouth daily.   rosuvastatin (CRESTOR) 10 MG tablet TAKE ONE TABLET BY MOUTH DAILY   traMADol (ULTRAM) 50 MG tablet Take 1 tablet (50 mg total) by mouth every 6 (six) hours as needed for up to 5 days for moderate pain.   tretinoin (RETIN-A) 0.05 % cream APPLY TOPICALLY AT BEDTIME     Allergies:   Iodine and Sulfa antibiotics   Social History   Socioeconomic History   Marital status: Married    Spouse name: Not on file   Number of children: Not on file   Years of education: Not on file   Highest education level: Not on file  Occupational History   Not on file  Tobacco Use   Smoking status: Never   Smokeless tobacco:  Never  Vaping Use   Vaping Use: Never used  Substance and Sexual Activity   Alcohol use: Yes    Comment: Less than 1 glass of wine or beer per week   Drug use: Never    Comment: older than 15, less than 5   Sexual activity: Yes    Birth control/protection: Post-menopausal  Other Topics Concern   Not on file  Social History Narrative   Not on file   Social Determinants of Health   Financial Resource Strain: Low Risk  (09/20/2021)   Overall Financial Resource Strain (CARDIA)    Difficulty of Paying Living Expenses: Not hard at all  Food Insecurity: No Food Insecurity (09/26/2022)   Hunger Vital Sign    Worried About Running Out of Food in the Last  Year: Never true    Ran Out of Food in the Last Year: Never true  Transportation Needs: No Transportation Needs (09/26/2022)   PRAPARE - Hydrologist (Medical): No    Lack of Transportation (Non-Medical): No  Physical Activity: Sufficiently Active (09/20/2021)   Exercise Vital Sign    Days of Exercise per Week: 6 days    Minutes of Exercise per Session: 60 min  Stress: No Stress Concern Present (09/20/2021)   [place]    Feeling of Stress : Not at all  Social Connections: Moderately Isolated (09/20/2021)   Social Connection and Isolation Panel [NHANES]    Frequency of Communication with Friends and Family: More than three times a week    Frequency of Social Gatherings with Friends and Family: More than three times a week    Attends Religious Services: Never    Marine scientist or Organizations: No    Attends Music therapist: Never    Marital Status: Married     Family History: The patient's family history includes Alcohol abuse in an other family member; Arthritis in her maternal grandmother and mother; Asthma in her father; Colon cancer in her maternal uncle; Diabetes in her father and sister; Hearing loss in her mother; Heart disease in her father, paternal grandfather, paternal uncle, and sister; Hyperlipidemia in her father, mother, and sister; Hypertension in her father, mother, and sister; Kidney disease in an other family member; Macular degeneration in her mother; Osteoporosis in her mother and sister; Vision loss in her maternal grandmother and mother. There is no history of Esophageal cancer, Stomach cancer, or Rectal cancer. ROS:   Please see the history of present illness.    All 14 point review of systems negative except as described per history of present illness  EKGs/Labs/Other Studies Reviewed:      Recent Labs: 03/31/2022: ALT 19; BUN 12; Creatinine, Ser  0.70; Hemoglobin 13.1; Magnesium 1.9; Platelets 300.0; Potassium 4.1; Sodium 141; TSH 4.82  Recent Lipid Panel    Component Value Date/Time   CHOL 133 03/31/2022 0750   CHOL 128 02/01/2022 1012   TRIG 113.0 03/31/2022 0750   HDL 52.40 03/31/2022 0750   HDL 43 02/01/2022 1012   CHOLHDL 3 03/31/2022 0750   VLDL 22.6 03/31/2022 0750   LDLCALC 58 03/31/2022 0750   LDLCALC 57 02/01/2022 1012    Physical Exam:    VS:  BP 136/60 (BP Location: Left Arm, Patient Position: Sitting)   Pulse 88   Ht 5' 2.5" (1.588 m)   Wt 125 lb (56.7 kg)   SpO2 99%   BMI 22.50 kg/m  Wt Readings from Last 3 Encounters:  12/20/22 125 lb (56.7 kg)  12/19/22 129 lb (58.5 kg)  09/26/22 128 lb 3.2 oz (58.2 kg)     GEN:  Well nourished, well developed in no acute distress HEENT: Normal NECK: No JVD; No carotid bruits LYMPHATICS: No lymphadenopathy CARDIAC: RRR, no murmurs, no rubs, no gallops RESPIRATORY:  Clear to auscultation without rales, wheezing or rhonchi  ABDOMEN: Soft, non-tender, non-distended MUSCULOSKELETAL:  No edema; No deformity  SKIN: Warm and dry LOWER EXTREMITIES: no swelling NEUROLOGIC:  Alert and oriented x 3 PSYCHIATRIC:  Normal affect   ASSESSMENT:    1. Nonrheumatic aortic valve insufficiency   2. Aneurysm of ascending aorta without rupture ([place])   3. Primary hypertension    PLAN:    In order of problems listed above:  Nonrheumatic arctic valve insufficiency is still holding quite well.  Ejection fraction normal left ventricle size normal.  Patient asymptomatic I discussed the pathophysiology of this condition and I warned her about shortness of breath as potential symptoms of it we will schedule her to have another echocardiogram symptoms in October and see her shortly after that. Aneurysm of the ascending aorta measuring 41 mm.  Will be reassessed by echocardiogram next time. Essential hypertension blood pressure controlled continue present management   Medication  Adjustments/Labs and Tests Ordered: Current medicines are reviewed at length with the patient today.  Concerns regarding medicines are outlined above.  No orders of the defined types were placed in this encounter.  Medication changes: No orders of the defined types were placed in this encounter.   Signed, Park Liter, MD, [facility] 12/20/2022 3:10 PM    [place]

## 2022-12-25 ENCOUNTER — Encounter: Payer: Self-pay | Admitting: Family

## 2022-12-26 ENCOUNTER — Ambulatory Visit (INDEPENDENT_AMBULATORY_CARE_PROVIDER_SITE_OTHER): Payer: Medicare Other | Admitting: Obstetrics and Gynecology

## 2022-12-26 ENCOUNTER — Other Ambulatory Visit (HOSPITAL_COMMUNITY)
Admission: RE | Admit: 2022-12-26 | Discharge: 2022-12-26 | Disposition: A | Discharge status: Home / Self Care | Payer: Medicare Other | Source: Ambulatory Visit | Attending: Obstetrics and Gynecology | Admitting: Obstetrics and Gynecology

## 2022-12-26 ENCOUNTER — Encounter: Payer: Self-pay | Admitting: Obstetrics and Gynecology

## 2022-12-26 ENCOUNTER — Telehealth: Payer: Self-pay

## 2022-12-26 VITALS — BP 128/84 | HR 77 | Ht 61.81 in | Wt 126.0 lb

## 2022-12-26 DIAGNOSIS — Z8739 Personal history of other diseases of the musculoskeletal system and connective tissue: Secondary | ICD-10-CM | POA: Diagnosis not present

## 2022-12-26 DIAGNOSIS — M8000XA Age-related osteoporosis with current pathological fracture, unspecified site, initial encounter for fracture: Secondary | ICD-10-CM

## 2022-12-26 DIAGNOSIS — Z8781 Personal history of (healed) traumatic fracture: Secondary | ICD-10-CM | POA: Diagnosis not present

## 2022-12-26 DIAGNOSIS — Z124 Encounter for screening for malignant neoplasm of cervix: Secondary | ICD-10-CM

## 2022-12-26 DIAGNOSIS — Z9189 Other specified personal risk factors, not elsewhere classified: Secondary | ICD-10-CM | POA: Diagnosis not present

## 2022-12-26 DIAGNOSIS — Z8619 Personal history of other infectious and parasitic diseases: Secondary | ICD-10-CM | POA: Diagnosis not present

## 2022-12-26 DIAGNOSIS — Z01419 Encounter for gynecological examination (general) (routine) without abnormal findings: Secondary | ICD-10-CM

## 2022-12-26 MED ORDER — ALENDRONATE SODIUM 70 MG PO TABS: 70.0000 mg | ORAL_TABLET | ORAL | 3 refills | Status: DC

## 2022-12-26 NOTE — Patient Instructions (Signed)

## 2022-12-26 NOTE — Telephone Encounter (Signed)
Yolanda Dom, MD  [place] Triage This patient has a h/o osteoporosis. She developed a vertebral fracture when she went off of prolia. Please get her in to see an Endocrinologist. I'm ordering another DEXA.  [facility]  Dr. Meredith Pel or Dr. Buddy Duty at [facility]  She needs someone to follow her.

## 2022-12-26 NOTE — Telephone Encounter (Signed)
Amb referral placed in Epic. Routed to [place] so she can forward referral/records and schedule.

## 2022-12-28 ENCOUNTER — Encounter: Payer: Self-pay | Admitting: Orthopedic Surgery

## 2022-12-28 LAB — CYTOLOGY - PAP: Diagnosis: NEGATIVE

## 2022-12-28 MED ORDER — TRAMADOL HCL 50 MG PO TABS: 50.0000 mg | ORAL_TABLET | Freq: Four times a day (QID) | ORAL | 0 refills | Status: AC | PRN

## 2023-01-04 LAB — HM DEXA SCAN

## 2023-01-05 ENCOUNTER — Encounter: Payer: Self-pay | Admitting: Obstetrics and Gynecology

## 2023-01-17 NOTE — Telephone Encounter (Signed)
Patient is scheduled 02/12/23 at 2:15pm with Dr. Chalmers Cater.

## 2023-02-14 ENCOUNTER — Other Ambulatory Visit (INDEPENDENT_AMBULATORY_CARE_PROVIDER_SITE_OTHER): Payer: Medicare Other

## 2023-02-14 ENCOUNTER — Ambulatory Visit (INDEPENDENT_AMBULATORY_CARE_PROVIDER_SITE_OTHER): Payer: Medicare Other | Admitting: Orthopedic Surgery

## 2023-02-14 DIAGNOSIS — S22060A Wedge compression fracture of T7-T8 vertebra, initial encounter for closed fracture: Secondary | ICD-10-CM | POA: Diagnosis not present

## 2023-02-14 LAB — LAB REPORT - SCANNED
Calcium: 10.3
EGFR: 87
PTH: 24
TSH: 3.08 (ref 0.41–5.90)

## 2023-02-14 NOTE — Progress Notes (Signed)
Orthopedic Spine Surgery Office Note  Assessment: Patient is a 67 y.o. female with T8 compression fracture (~3 months from injury)   Plan: -Patient is currently established with an endocrinologist who recommended Tymlos.  I told her that that is a reasonable recommendation since Tymlos is a bone forming agent -Referred her to physical therapy in [place] for postural training -Will eventually have her do physical therapy for core strengthening and rehabilitation after she has healed further -Pain control: OTC medications -Patient should return to office in 8 weeks, x-rays at next visit: AP/lateral thoracic   Patient expressed understanding of the plan and all questions were answered to the patient's satisfaction.   ___________________________________________________________________________  History: Patient is a 67 y.o. female who has been previously seen in the office for symptoms of thoracic back pain consistent with her T8 compression fracture.  Her pain is improved since the last time I saw her.  She states that it is not perfect and she still has pain if she bends or lifts something.  It is significantly better though than when this fracture first occurred.  She is only taking over-the-counter pain medications for pain relief.  Has now established with an endocrinologist.  Got a repeat DEXA scan that showed a T-score of -2.8.  Has not noticed any weakness in her legs or unsteadiness.  Denies paresthesias and numbness.  Previous treatments: Tylenol, tramadol, muscle relaxer, activity modification   Physical Exam:  General: no acute distress, appears stated age Neurologic: alert, answering questions appropriately, following commands Respiratory: unlabored breathing on room air, symmetric chest rise Psychiatric: appropriate affect, normal cadence to speech   MSK (spine):  -Strength exam      Left  Right EHL    5/5  5/5 TA    5/5  5/5 GSC    5/5  5/5 Knee extension   5/5  5/5 Hip flexion   5/5  5/5  -Sensory exam    Sensation intact to light touch in L3-S1 nerve distributions of bilateral lower extremities  Imaging: XR of the thoracic spine from 02/14/2023 was independently reviewed and interpreted, showing T8 compression fracture with anterior height loss.  No significant change in height loss since films on 12/12/2022. No other fracture seen.  No spondylolisthesis seen.    Patient name: Yolanda Hernandez Patient MRN: [id number] Date of visit: 02/14/23

## 2023-02-28 ENCOUNTER — Telehealth: Payer: Self-pay | Admitting: Orthopedic Surgery

## 2023-02-28 NOTE — Telephone Encounter (Signed)
Patient called. [facility] needs the referral signed by Dr. Christell Constant and faxed back. Her call back number is [phone]

## 2023-03-01 NOTE — Telephone Encounter (Signed)
Referral with Dr. Christell Constant signature faxed back to PT as requested.

## 2023-03-09 ENCOUNTER — Encounter: Payer: Self-pay | Admitting: Orthopedic Surgery

## 2023-03-17 LAB — LAB REPORT - SCANNED: EGFR: 83

## 2023-03-20 DIAGNOSIS — R531 Weakness: Secondary | ICD-10-CM | POA: Insufficient documentation

## 2023-03-20 DIAGNOSIS — G8929 Other chronic pain: Secondary | ICD-10-CM | POA: Insufficient documentation

## 2023-03-20 DIAGNOSIS — R293 Abnormal posture: Secondary | ICD-10-CM | POA: Insufficient documentation

## 2023-03-20 DIAGNOSIS — S22060A Wedge compression fracture of T7-T8 vertebra, initial encounter for closed fracture: Secondary | ICD-10-CM | POA: Insufficient documentation

## 2023-04-05 IMAGING — CT CT CHEST W/O CM
2 of 4 series · 15 of 36 positions shown, 18 images · non-contrast
Comparison: Plain film of right-sided ribs dated 01/25/2015.

CLINICAL DATA: Ascending aortic aneurysm of 39 mm.

EXAM:
CT CHEST WITHOUT CONTRAST
TECHNIQUE: Multidetector CT imaging of the chest was performed following the
standard protocol without IV contrast.

[Series 2: thorax · axial · 0.77mm/px · z∈[-380,-116]mm · 12 of 156 slices shown, 15 images]
[im 12/156  mediastinal]
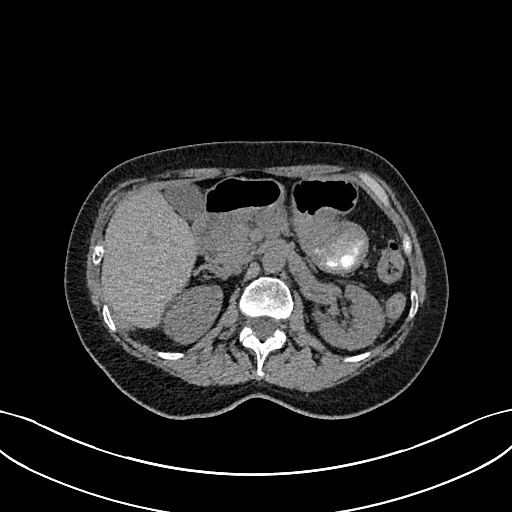
[im 12/156  lung]
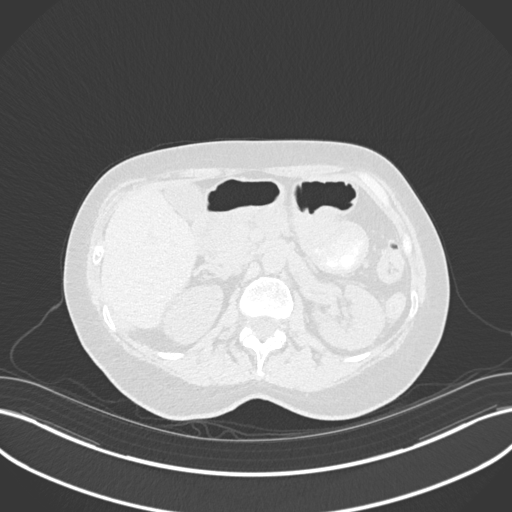
[im 23/156  lung]
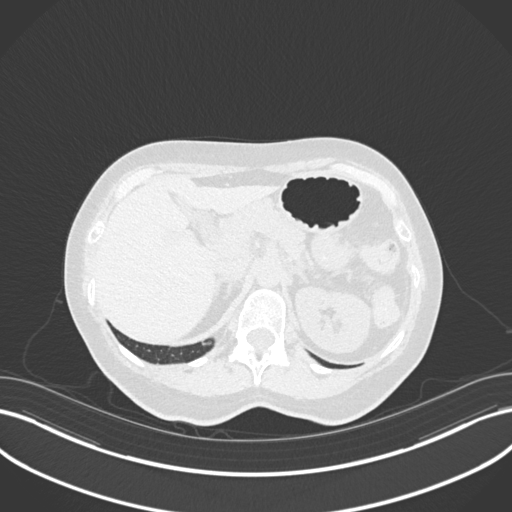
[im 34/156  lung]
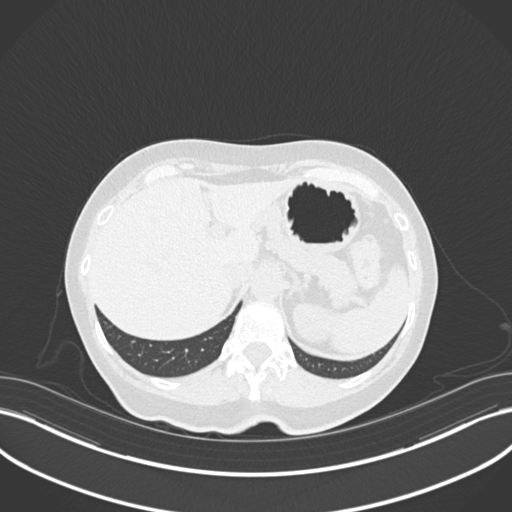
[im 45/156  lung]
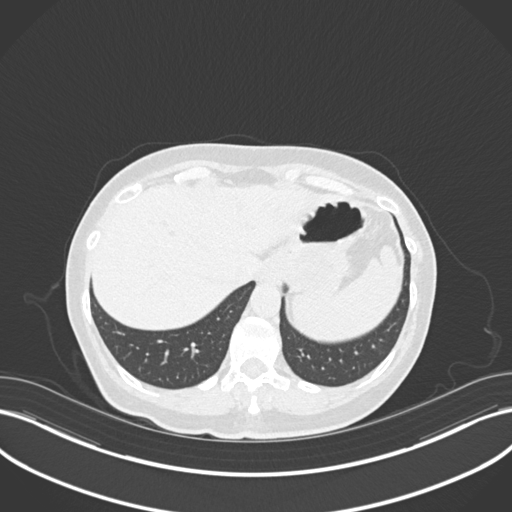
[im 56/156  mediastinal]
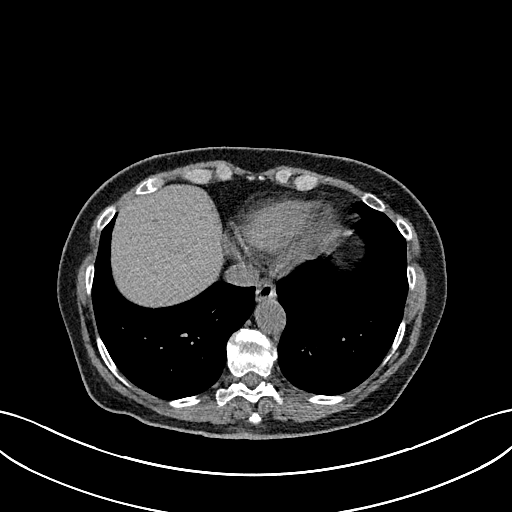
[im 56/156  lung]
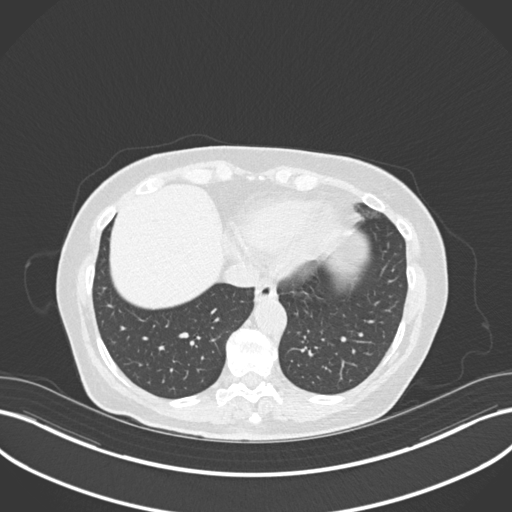
[im 67/156  lung]
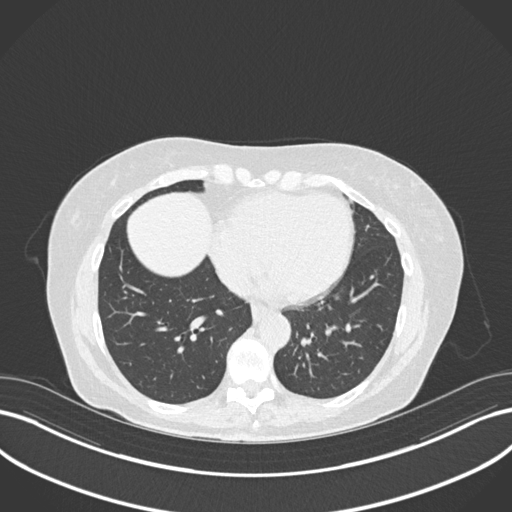
[im 89/156  lung]
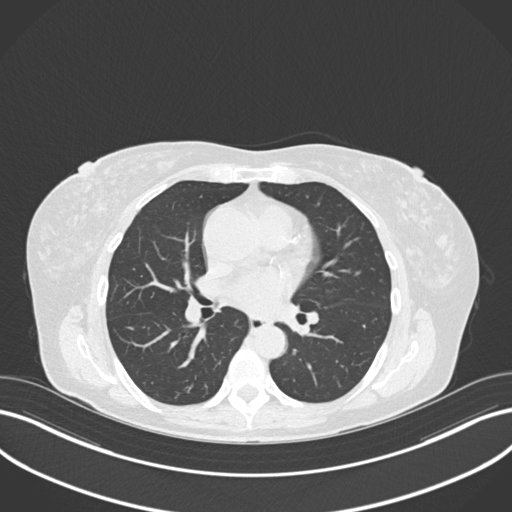
[im 100/156  lung]
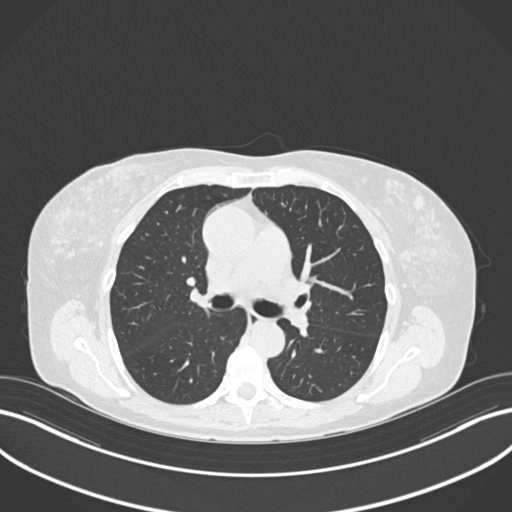
[im 111/156  mediastinal]
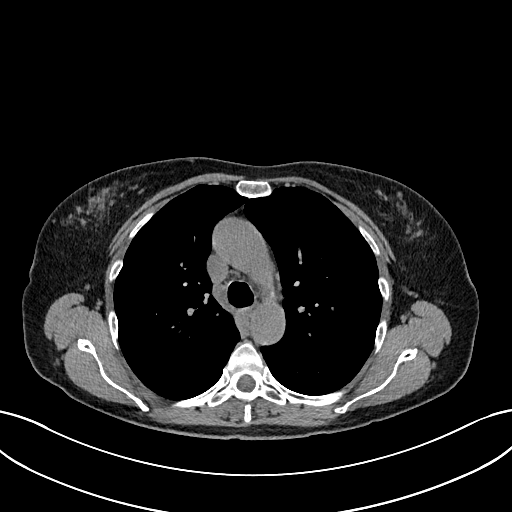
[im 111/156  lung]
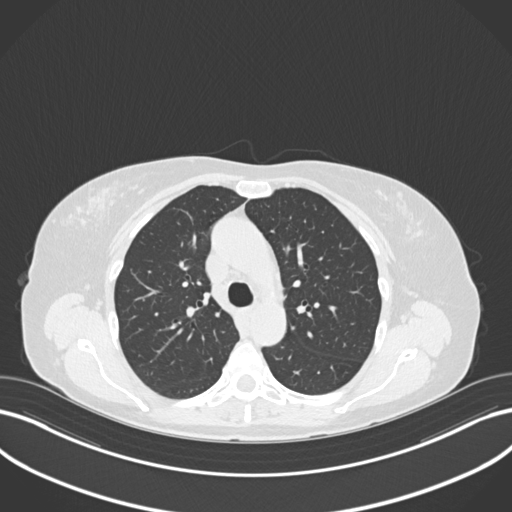
[im 122/156  lung]
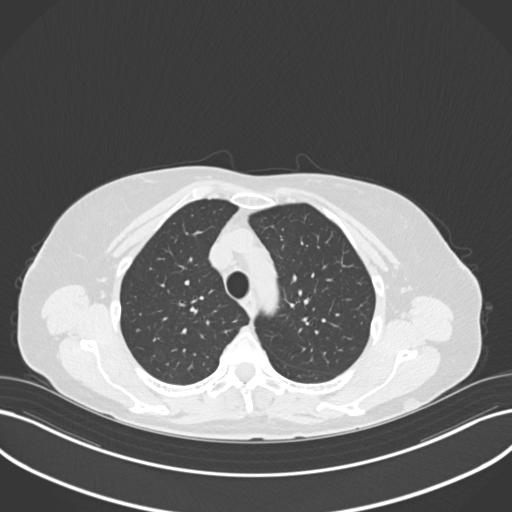
[im 133/156  lung]
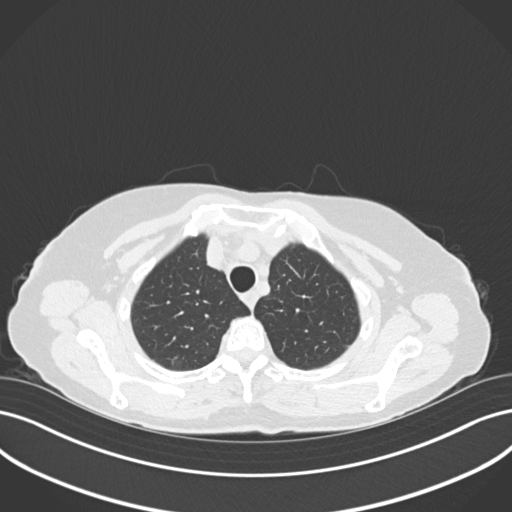
[im 144/156  lung]
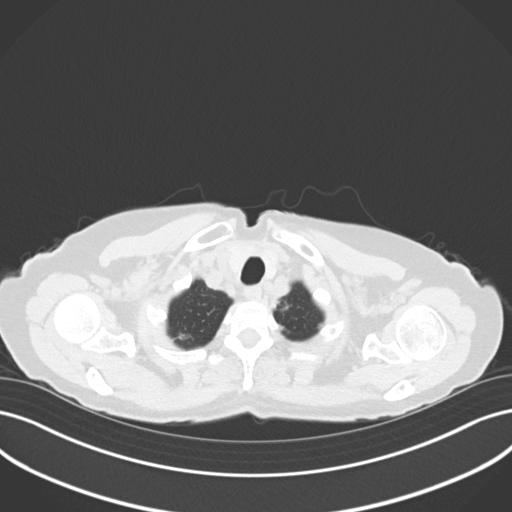

[Series 5: coronal · coronal · 0.60mm/px · 3 of 120 slices shown]
[im 24/120  lung]
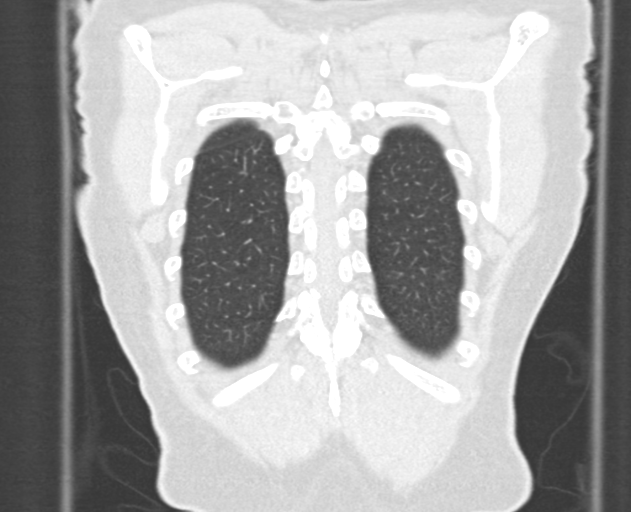
[im 48/120  lung]
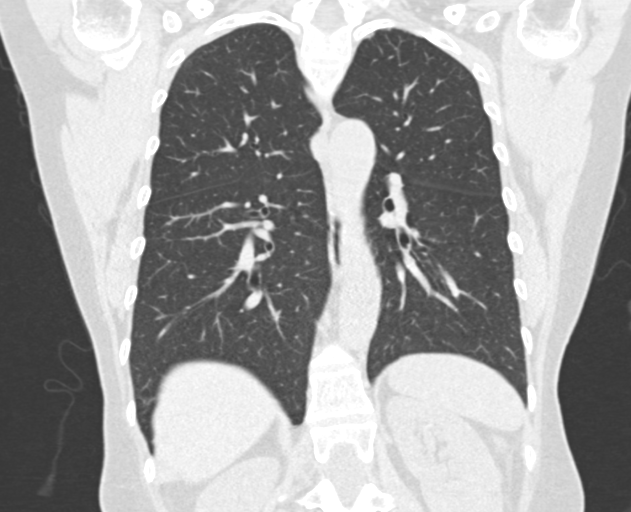
[im 72/120  lung]
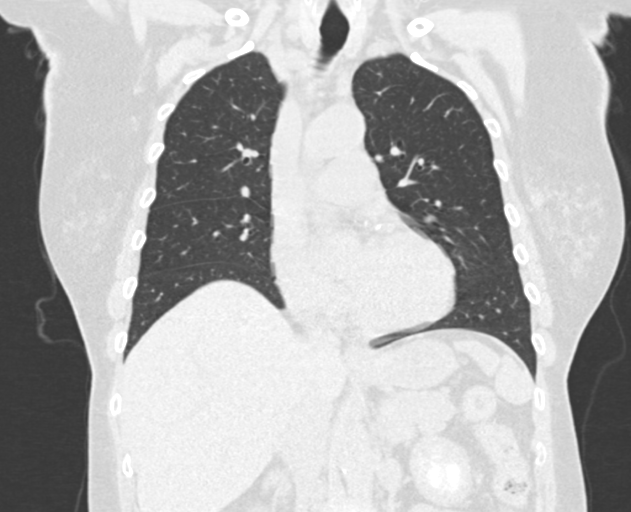

[15 of 36 positions shown; findings below may reference images not displayed]

FINDINGS: Cardiovascular: Aortic atherosclerosis. Based on transverse images,
the aorta measures 3.4 cm at the sinuses, 3.8 cm in the mid
ascending segment. Based on coronal reformats, the aorta measures up
to 3.9 cm in the mid ascending segment.

Normal caliber of the great vessels. Normal heart size, without
pericardial effusion. Three vessel coronary artery calcification.

Mediastinum/Nodes: No mediastinal or definite hilar adenopathy,
given limitations of unenhanced CT.

Lungs/Pleura: No pleural fluid. Left upper lobe ground-glass nodule
of 4 mm on 41/4.

Minimal biapical pleuroparenchymal scarring.

Upper Abdomen: Hepatic subcentimeter hypoattenuating lesions are
likely cysts. Normal imaged portions of the spleen, stomach,
pancreas, gallbladder, adrenal glands, kidneys.

Musculoskeletal: No acute osseous abnormality.
IMPRESSION: 1. Upper normal ascending aortic size, including at up to 3.9 cm.
2.  No acute process in the chest.
3.  Aortic Atherosclerosis (87LBT-29T.T).
4. Age advanced coronary artery atherosclerosis. Recommend
assessment of coronary risk factors and consideration of medical
therapy.
5. Left upper lobe 4 mm ground-glass nodule. Per consensus criteria,
this does not warrant imaging follow-up. This recommendation follows
the consensus statement: Guidelines for Management of Incidental
Pulmonary Nodules Detected on CT Images:From the [HOSPITAL]
8943; published online before print (10.1148/radiol.8171747464).

## 2023-04-18 ENCOUNTER — Other Ambulatory Visit: Payer: Self-pay

## 2023-04-18 ENCOUNTER — Ambulatory Visit (INDEPENDENT_AMBULATORY_CARE_PROVIDER_SITE_OTHER): Payer: Medicare Other | Admitting: Orthopedic Surgery

## 2023-04-18 DIAGNOSIS — S22060D Wedge compression fracture of T7-T8 vertebra, subsequent encounter for fracture with routine healing: Secondary | ICD-10-CM

## 2023-04-18 NOTE — Progress Notes (Signed)
Orthopedic Spine Surgery Office Note   Assessment: Patient is a 67 y.o. female with T8 compression fracture (~5 months from injury)     Plan: -No acute operative intervention -Continue with endocrinology for osteoporosis treatment -Can start strengthening exercises with PT -Pain control: OTC medications -Patient should return to office on as needed basis    Patient expressed understanding of the plan and all questions were answered to the patient's satisfaction.    ___________________________________________________________________________   History: Patient is a 67 y.o. female who has been previously seen in the office for symptoms of thoracic back pain consistent with her T8 compression fracture.  Patient's back pain has improved since the last time I saw her.  She now just has occasional pain in her back.  She has not any pain radiating to either lower extremity.   Previous treatments: Tylenol, tramadol, muscle relaxer, activity modification    Physical Exam:   General: no acute distress, appears stated age Neurologic: alert, answering questions appropriately, following commands Respiratory: unlabored breathing on room air, symmetric chest rise Psychiatric: appropriate affect, normal cadence to speech     MSK (spine):   -Strength exam                                                   Left                  Right EHL                              5/5                  5/5 TA                                 5/5                  5/5 GSC                             5/5                  5/5 Knee extension            5/5                  5/5 Hip flexion                    5/5                  5/5   -Sensory exam                           Sensation intact to light touch in L3-S1 nerve distributions of bilateral lower extremities   Imaging: XR of the thoracic spine from 04/18/2023 was independently reviewed and interpreted, showing T8 compression fracture with anterior height  loss.  No significant change in height loss since films on 12/12/2022. No other fracture seen.  No spondylolisthesis seen.      Patient name: Yolanda Hernandez Patient MRN: [id number] Date of visit: 04/18/23

## 2023-05-16 ENCOUNTER — Encounter (INDEPENDENT_AMBULATORY_CARE_PROVIDER_SITE_OTHER): Payer: Self-pay

## 2023-05-16 LAB — HM DEXA SCAN

## 2023-05-31 ENCOUNTER — Other Ambulatory Visit (HOSPITAL_BASED_OUTPATIENT_CLINIC_OR_DEPARTMENT_OTHER): Payer: Self-pay | Admitting: Orthopaedic Surgery

## 2023-05-31 DIAGNOSIS — S22060A Wedge compression fracture of T7-T8 vertebra, initial encounter for closed fracture: Secondary | ICD-10-CM

## 2023-06-01 ENCOUNTER — Telehealth: Payer: Self-pay | Admitting: Family Medicine

## 2023-06-01 NOTE — Telephone Encounter (Signed)
Pt called to reschedule her CPE with Dr. Abner Greenspan. After scheduling her for 1.14.25, she expressed that she was very disappointed with her having to wait for her appt. Offered an appt with Ladona Ridgel or Melissa. Pt declined stating that she wanted to go over her health with a dr. Rock Nephew asked if she could be seen by another Dr sooner than January. Please Advise.

## 2023-06-04 ENCOUNTER — Encounter: Payer: Medicare Other | Admitting: Family Medicine

## 2023-06-04 NOTE — Assessment & Plan Note (Signed)
hgba1c acceptable, minimize simple carbs. Increase exercise as tolerated.  

## 2023-06-04 NOTE — Assessment & Plan Note (Signed)
Hydrate and monitor 

## 2023-06-04 NOTE — Assessment & Plan Note (Signed)
Well controlled, no changes to meds. Encouraged heart healthy diet such as the DASH diet and exercise as tolerated.  °

## 2023-06-04 NOTE — Assessment & Plan Note (Signed)
[place] for echo

## 2023-06-04 NOTE — Assessment & Plan Note (Signed)
Encouraged to get adequate exercise, calcium and vitamin d intake monitor vitamin d levels. Patient now on Tymlos prescribed by her endocrinologist but her orthopaedist has suggested she consider higher dose HRT to manage her bone health +/- testosterone. A specialist in [place] has been recommend. Will attempt to find a practitioner doing this work closer to home.

## 2023-06-04 NOTE — Assessment & Plan Note (Signed)
Encourage heart healthy diet such as MIND or DASH diet, increase exercise, avoid trans fats, simple carbohydrates and processed foods, consider a krill or fish or flaxseed oil cap daily.  Tolerating Rosuvastatin 

## 2023-06-04 NOTE — Telephone Encounter (Signed)
Called pt lvm to see if she would like to do VV with  Dr.Blyth. Call our office back to schedule the appt.

## 2023-06-05 ENCOUNTER — Encounter: Payer: Self-pay | Admitting: Family Medicine

## 2023-06-05 ENCOUNTER — Telehealth (INDEPENDENT_AMBULATORY_CARE_PROVIDER_SITE_OTHER): Payer: Medicare Other | Admitting: Family Medicine

## 2023-06-05 DIAGNOSIS — R252 Cramp and spasm: Secondary | ICD-10-CM

## 2023-06-05 DIAGNOSIS — I351 Nonrheumatic aortic (valve) insufficiency: Secondary | ICD-10-CM

## 2023-06-05 DIAGNOSIS — R739 Hyperglycemia, unspecified: Secondary | ICD-10-CM | POA: Diagnosis not present

## 2023-06-05 DIAGNOSIS — M810 Age-related osteoporosis without current pathological fracture: Secondary | ICD-10-CM

## 2023-06-05 DIAGNOSIS — E782 Mixed hyperlipidemia: Secondary | ICD-10-CM | POA: Diagnosis not present

## 2023-06-05 DIAGNOSIS — I10 Essential (primary) hypertension: Secondary | ICD-10-CM | POA: Diagnosis not present

## 2023-06-05 NOTE — Progress Notes (Signed)
MyChart Video Visit    Virtual Visit via Video Note   This patient is at least at moderate risk for complications without adequate follow up. This format is felt to be most appropriate for this patient at this time. Physical exam was limited by quality of the video and audio technology used for the visit. Shamaine, CMA was able to get the patient set up on a video visit.  Patient location: home Patient and provider in visit Provider location: Office  I discussed the limitations of evaluation and management by telemedicine and the availability of in person appointments. The patient expressed understanding and agreed to proceed.  Visit Date: 06/05/2023  Today's healthcare provider: Danise Edge, MD     Subjective:    Patient ID: Yolanda Hernandez, female    DOB: 1956-02-12, 67 y.o.   MRN: [id number]  Chief Complaint  Patient presents with   Follow-up    Follow up    HPI Discussed the use of AI scribe software for clinical note transcription with the patient, who gave verbal consent to proceed.  History of Present Illness   The patient, a 67 year old woman with a history of osteoporosis, presents for discussion of her recent compression fracture and ongoing management of her bone health. She reports conflicting information from various specialists regarding her treatment plan. She was diagnosed with a compression fracture at the end of January, which she believes may have been due to discontinuing Prolia, a medication she had been on for four and a half years. She has since been prescribed Tymlos by an endocrinologist and has been taking it for a little over three months. She reports that her bone density scan showed worsening from the last one, but not to a severe degree. She also underwent an echo light REMS, which showed good bone quality.  The patient is considering hormone replacement therapy (HRT) as recommended by an orthopedist in [place]. She has never been on HRT before and  went through menopause without significant issues. She is aware of the potential benefits of HRT for heart, brain, and bone health, but is unsure about starting it at her age and is seeking advice.  She also mentions a diagnosis of hypercalciuria, for which she was prescribed hydrochlorothiazide. She has stopped taking calcium supplements as she believes she gets enough calcium through her diet and is concerned about the risk of kidney stones. She continues to experience some pain from her fracture and is eager to prevent future fractures.        Past Medical History:  Diagnosis Date   Cerumen impaction    chronic   Cervical cancer screening 03/12/2015   Menarche at 10 Regular and moderate flow  history of abnormal pap in past, bx and cryo once many years ago, normal since. Last 2011 and normal G0P0, s/p No history of abnormal MGM Last in 2015, no breast concerns  No concerns today Biopsy, cervical: gyn surgeries Menopause at 53 ish   Conflict between patient and family 03/26/2021   Dysmenorrhea    Heart murmur 03/26/2021   Hematuria 07/31/2017   HPV in female 2019   Hyperglycemia 03/19/2013   Hypertension    Muscle cramping 03/26/2021   Osteopenia    Osteoporosis 04/11/2010   Qualifier: Diagnosis of  By: Nena Jordan    Other and unspecified hyperlipidemia 03/19/2013   Preventative health care 09/08/2011   Right calf pain 03/22/2021   Skin cancer    Skin cancer 07/14/2016  Uterine fibroid     Past Surgical History:  Procedure Laterality Date   BREAST SURGERY     biopsy left   COLPOSCOPY     GYNECOLOGIC CRYOSURGERY     MYRINGOPLASTY  1972   TONSILLECTOMY      Family History  Problem Relation Age of Onset   Osteoporosis Mother    Hyperlipidemia Mother    Hypertension Mother    Macular degeneration Mother    Arthritis Mother    Hearing loss Mother    Vision loss Mother    Heart disease Father    Diabetes Father    Hyperlipidemia Father    Hypertension Father     Asthma Father    Diabetes Sister    Heart disease Sister        s/p cardiac stent   Hyperlipidemia Sister    Hypertension Sister    Osteoporosis Sister    Colon cancer Maternal Uncle    Heart disease Paternal Uncle    Arthritis Maternal Grandmother    Vision loss Maternal Grandmother    Heart disease Paternal Grandfather    Kidney disease Other    Alcohol abuse Other    Esophageal cancer Neg Hx    Stomach cancer Neg Hx    Rectal cancer Neg Hx     Social History   Socioeconomic History   Marital status: Married    Spouse name: Not on file   Number of children: Not on file   Years of education: Not on file   Highest education level: Not on file  Occupational History   Not on file  Tobacco Use   Smoking status: Never   Smokeless tobacco: Never  Vaping Use   Vaping status: Never Used  Substance and Sexual Activity   Alcohol use: Yes    Comment: Less than 1 glass of wine or beer per week   Drug use: Never    Comment: older than 15, less than 5   Sexual activity: Yes    Birth control/protection: Post-menopausal  Other Topics Concern   Not on file  Social History Narrative   Not on file   Social Determinants of Health   Financial Resource Strain: Low Risk  (09/20/2021)   Overall Financial Resource Strain (CARDIA)    Difficulty of Paying Living Expenses: Not hard at all  Food Insecurity: No Food Insecurity (09/26/2022)   Hunger Vital Sign    Worried About Running Out of Food in the Last Year: Never true    Ran Out of Food in the Last Year: Never true  Transportation Needs: No Transportation Needs (09/26/2022)   PRAPARE - Administrator, Civil Service (Medical): No    Lack of Transportation (Non-Medical): No  Physical Activity: Sufficiently Active (09/20/2021)   Exercise Vital Sign    Days of Exercise per Week: 6 days    Minutes of Exercise per Session: 60 min  Stress: No Stress Concern Present (09/20/2021)   [employer] of Occupational Health -  Occupational Stress Questionnaire    Feeling of Stress : Not at all  Social Connections: Unknown (12/04/2022)   [facility]   Social Network    Social Network: Not on file  Intimate Partner Violence: Unknown (12/04/2022)   Received from [facility]   HITS    Physically Hurt: Not on file    Insult or Talk Down To: Not on file    Threaten Physical Harm: Not on file    Scream or Curse:  Not on file    Outpatient Medications Prior to Visit  Medication Sig Dispense Refill   Abaloparatide (TYMLOS) 3120 MCG/1.56ML SOPN Inject 80 mcg into the skin daily.     Multiple Vitamins-Minerals (PRESERVISION AREDS 2+MULTI VIT PO) Take 1 tablet by mouth in the morning and at bedtime.     Probiotic Product (PROBIOTIC PO) Take 1 tablet by mouth daily.     rosuvastatin (CRESTOR) 10 MG tablet Take 1 tablet (10 mg total) by mouth daily. 90 tablet 3   tretinoin (RETIN-A) 0.05 % cream APPLY TOPICALLY AT BEDTIME 45 g 5   alendronate (FOSAMAX) 70 MG tablet Take 1 tablet (70 mg total) by mouth every 7 (seven) days. Take first thing in am with 6 oz. Water.  Be upright after taking.  Eat nothing for one hour. 12 tablet 3   Calcium-Vitamin D-Vitamin K (CALCIUM SOFT CHEWS PO) Take 1 each by mouth daily. Unknown strength     methocarbamol (ROBAXIN) 500 MG tablet Take 1 tablet (500 mg total) by mouth every 8 (eight) hours as needed for muscle spasms. 20 tablet 0   No facility-administered medications prior to visit.    Allergies  Allergen Reactions   Iodine Rash   Sulfa Antibiotics Rash    Review of Systems  Constitutional:  Negative for fever and malaise/fatigue.  HENT:  Negative for congestion.   Eyes:  Negative for blurred vision.  Respiratory:  Negative for shortness of breath.   Cardiovascular:  Negative for chest pain, palpitations and leg swelling.  Gastrointestinal:  Negative for abdominal pain, blood in stool and nausea.  Genitourinary:  Negative for dysuria and frequency.   Musculoskeletal:  Positive for back pain. Negative for falls.  Skin:  Negative for rash.  Neurological:  Negative for dizziness, loss of consciousness and headaches.  Endo/Heme/Allergies:  Negative for environmental allergies.  Psychiatric/Behavioral:  Negative for depression. The patient is not nervous/anxious.        Objective:    Physical Exam Constitutional:      General: She is not in acute distress.    Appearance: Normal appearance. She is not ill-appearing or toxic-appearing.  HENT:     Head: Normocephalic and atraumatic.     Right Ear: External ear normal.     Left Ear: External ear normal.     Nose: Nose normal.  Eyes:     General:        Right eye: No discharge.        Left eye: No discharge.  Pulmonary:     Effort: Pulmonary effort is normal.  Skin:    Findings: No rash.  Neurological:     Mental Status: She is alert and oriented to person, place, and time.  Psychiatric:        Behavior: Behavior normal.     There were no vitals taken for this visit. Wt Readings from Last 3 Encounters:  12/26/22 126 lb (57.2 kg)  12/20/22 125 lb (56.7 kg)  12/19/22 129 lb (58.5 kg)       Assessment & Plan:  Hyperglycemia Assessment & Plan: hgba1c acceptable, minimize simple carbs. Increase exercise as tolerated.    Hyperlipidemia, mixed Assessment & Plan: Encourage heart healthy diet such as MIND or DASH diet, increase exercise, avoid trans fats, simple carbohydrates and processed foods, consider a krill or fish or flaxseed oil cap daily. Tolerating Rosuvastatin   Primary hypertension Assessment & Plan: Well controlled, no changes to meds. Encouraged heart healthy diet such as the DASH diet and  exercise as tolerated.    Muscle cramping Assessment & Plan: Hydrate and monitor    Osteoporosis, unspecified osteoporosis type, unspecified pathological fracture presence Assessment & Plan: Encouraged to get adequate exercise, calcium and vitamin d intake monitor  vitamin d levels. Patient now on Tymlos prescribed by her endocrinologist but her orthopaedist has suggested she consider higher dose HRT to manage her bone health +/- testosterone. A specialist in [place] has been recommend. Will attempt to find a practitioner doing this work closer to home.    Nonrheumatic aortic valve insufficiency     Assessment and Plan    Osteoporosis History of compression fracture in January, currently on Tymlos for three months. Bone density scan showed worsening but not severe. Echo light REMS showed good bone quality. Considering hormone replacement therapy (HRT) for bone health. Hypercalciuria noted and currently on hydrochlorothiazide. -Continue Tymlos as prescribed by endocrinologist. -Ensure adequate dietary calcium intake (1200-1500mg  daily). -Consider consultation with a specialist for compounded HRT, specifically for osteoporosis treatment. -Continue hydrochlorothiazide for hypercalciuria. -Undergo MRI of the parathyroid region on 06/20/2023 to evaluate for possible hyperparathyroidism. -Follow up with orthopedist for further evaluation of osteoclast activity and potential treatment adjustments.         I discussed the assessment and treatment plan with the patient. The patient was provided an opportunity to ask questions and all were answered. The patient agreed with the plan and demonstrated an understanding of the instructions.   The patient was advised to call back or seek an in-person evaluation if the symptoms worsen or if the condition fails to improve as anticipated.  Danise Edge, MD [facility] at [facility] [phone] (phone) [phone] (fax)  [facility]

## 2023-06-11 ENCOUNTER — Ambulatory Visit: Payer: Medicare Other | Attending: Cardiology | Admitting: Cardiology

## 2023-06-11 ENCOUNTER — Encounter: Payer: Self-pay | Admitting: Cardiology

## 2023-06-11 VITALS — BP 122/68 | HR 85 | Ht 62.0 in | Wt 126.8 lb

## 2023-06-11 DIAGNOSIS — I351 Nonrheumatic aortic (valve) insufficiency: Secondary | ICD-10-CM | POA: Diagnosis not present

## 2023-06-11 DIAGNOSIS — I10 Essential (primary) hypertension: Secondary | ICD-10-CM | POA: Diagnosis not present

## 2023-06-11 DIAGNOSIS — I7121 Aneurysm of the ascending aorta, without rupture: Secondary | ICD-10-CM | POA: Diagnosis not present

## 2023-06-11 NOTE — Patient Instructions (Addendum)
Medication Instructions:  Your physician recommends that you continue on your current medications as directed. Please refer to the Current Medication list given to you today.  *If you need a refill on your cardiac medications before your next appointment, please call your pharmacy*   Lab Work: Your physician recommends that you return for lab work in:   Labs with PCP: Lipid  If you have labs (blood work) drawn today and your tests are completely normal, you will receive your results only by: MyChart Message (if you have MyChart) OR A paper copy in the mail If you have any lab test that is abnormal or we need to change your treatment, we will call you to review the results.   Testing/Procedures: Your physician has requested that you have an echocardiogram. Echocardiography is a painless test that uses sound waves to create images of your heart. It provides your doctor with information about the size and shape of your heart and how well your heart's chambers and valves are working. This procedure takes approximately one hour. There are no restrictions for this procedure. Please do NOT wear cologne, perfume, aftershave, or lotions (deodorant is allowed). Please arrive 15 minutes prior to your appointment time.    Follow-Up: At [facility], you and your health needs are our priority.  As part of our continuing mission to provide you with exceptional heart care, we have created designated Provider Care Teams.  These Care Teams include your primary Cardiologist (physician) and Advanced Practice Providers (APPs -  Physician Assistants and Nurse Practitioners) who all work together to provide you with the care you need, when you need it.  We recommend signing up for the patient portal called "MyChart".  Sign up information is provided on this After Visit Summary.  MyChart is used to connect with patients for Virtual Visits (Telemedicine).  Patients are able to view lab/test results,  encounter notes, upcoming appointments, etc.  Non-urgent messages can be sent to your provider as well.   To learn more about what you can do with MyChart, go to [email or web address].    Your next appointment:   1 year(s)  Provider:   Gypsy Balsam, MD    Other Instructions None

## 2023-06-11 NOTE — Progress Notes (Unsigned)
Cardiology Office Note:    Date:  06/11/2023   ID:  Yolanda Hernandez, DOB 1956/02/03, MRN [id number]  PCP:  Bradd Canary, MD  Cardiologist:  Gypsy Balsam, MD    Referring MD: Bradd Canary, MD   No chief complaint on file.   History of Present Illness:    Yolanda Hernandez is a 67 y.o. female with past medical history significant for moderate aortic insufficiency normal ejection fraction normal LV size, ascending aortic MEASURING 41 mm, essential hypertension, dyslipidemia.  She also got calcification of the coronary artery.  Comes today to months for follow-up.  Overall doing very well.  She did have compression fracture in her vertebrae doing physical therapy doing better started walking miles a day asymptomatic.  She find out that her husband does have nasopharyngeal cancer and he regularly required radiation therapy and chemotherapy and she is very stressed about it.  Denies have any shortness of breath chest pain palpitation dizziness or passing out overall seems to being well  Past Medical History:  Diagnosis Date   Cerumen impaction    chronic   Cervical cancer screening 03/12/2015   Menarche at 10 Regular and moderate flow  history of abnormal pap in past, bx and cryo once many years ago, normal since. Last 2011 and normal G0P0, s/p No history of abnormal MGM Last in 2015, no breast concerns  No concerns today Biopsy, cervical: gyn surgeries Menopause at 53 ish   Conflict between patient and family 03/26/2021   Dysmenorrhea    Heart murmur 03/26/2021   Hematuria 07/31/2017   HPV in female 2019   Hyperglycemia 03/19/2013   Hypertension    Muscle cramping 03/26/2021   Osteopenia    Osteoporosis 04/11/2010   Qualifier: Diagnosis of  By: Nena Jordan    Other and unspecified hyperlipidemia 03/19/2013   Preventative health care 09/08/2011   Right calf pain 03/22/2021   Skin cancer    Skin cancer 07/14/2016   Uterine fibroid     Past Surgical History:   Procedure Laterality Date   BREAST SURGERY     biopsy left   COLPOSCOPY     GYNECOLOGIC CRYOSURGERY     MYRINGOPLASTY  1972   TONSILLECTOMY      Current Medications: Current Meds  Medication Sig   Abaloparatide (TYMLOS) 3120 MCG/1.56ML SOPN Inject 80 mcg into the skin daily.   hydrochlorothiazide (HYDRODIURIL) 12.5 MG tablet Take 12.5 mg by mouth daily.   Multiple Vitamins-Minerals (PRESERVISION AREDS 2+MULTI VIT PO) Take 1 tablet by mouth in the morning and at bedtime.   Probiotic Product (PROBIOTIC PO) Take 1 tablet by mouth daily.   rosuvastatin (CRESTOR) 10 MG tablet Take 1 tablet (10 mg total) by mouth daily.   tretinoin (RETIN-A) 0.05 % cream APPLY TOPICALLY AT BEDTIME     Allergies:   Iodine and Sulfa antibiotics   Social History   Socioeconomic History   Marital status: Married    Spouse name: Not on file   Number of children: Not on file   Years of education: Not on file   Highest education level: Not on file  Occupational History   Not on file  Tobacco Use   Smoking status: Never   Smokeless tobacco: Never  Vaping Use   Vaping status: Never Used  Substance and Sexual Activity   Alcohol use: Yes    Comment: Less than 1 glass of wine or beer per week   Drug use: Never  Comment: older than 15, less than 5   Sexual activity: Yes    Birth control/protection: Post-menopausal  Other Topics Concern   Not on file  Social History Narrative   Not on file   Social Determinants of Health   Financial Resource Strain: Low Risk  (09/20/2021)   Overall Financial Resource Strain (CARDIA)    Difficulty of Paying Living Expenses: Not hard at all  Food Insecurity: No Food Insecurity (09/26/2022)   Hunger Vital Sign    Worried About Running Out of Food in the Last Year: Never true    Ran Out of Food in the Last Year: Never true  Transportation Needs: No Transportation Needs (09/26/2022)   PRAPARE - Administrator, Civil Service (Medical): No    Lack of  Transportation (Non-Medical): No  Physical Activity: Sufficiently Active (09/20/2021)   Exercise Vital Sign    Days of Exercise per Week: 6 days    Minutes of Exercise per Session: 60 min  Stress: No Stress Concern Present (09/20/2021)   [employer] of Occupational Health - Occupational Stress Questionnaire    Feeling of Stress : Not at all  Social Connections: Unknown (12/04/2022)   Received [facility]   Social Network    Social Network: Not on file     Family History: The patient's family history includes Alcohol abuse in an other family member; Arthritis in her maternal grandmother and mother; Asthma in her father; Colon cancer in her maternal uncle; Diabetes in her father and sister; Hearing loss in her mother; Heart disease in her father, paternal grandfather, paternal uncle, and sister; Hyperlipidemia in her father, mother, and sister; Hypertension in her father, mother, and sister; Kidney disease in an other family member; Macular degeneration in her mother; Osteoporosis in her mother and sister; Vision loss in her maternal grandmother and mother. There is no history of Esophageal cancer, Stomach cancer, or Rectal cancer. ROS:   Please see the history of present illness.    All 14 point review of systems negative except as described per history of present illness  EKGs/Labs/Other Studies Reviewed:         Recent Labs: No results found for requested labs within last 365 days.  Recent Lipid Panel    Component Value Date/Time   CHOL 133 03/31/2022 0750   CHOL 128 02/01/2022 1012   TRIG 113.0 03/31/2022 0750   HDL 52.40 03/31/2022 0750   HDL 43 02/01/2022 1012   CHOLHDL 3 03/31/2022 0750   VLDL 22.6 03/31/2022 0750   LDLCALC 58 03/31/2022 0750   LDLCALC 57 02/01/2022 1012    Physical Exam:    VS:  BP 122/68 (BP Location: Left Arm, Patient Position: Sitting, Cuff Size: Normal)   Pulse 85   Ht 5\' 2"  (1.575 m)   Wt 126 lb 12.8 oz (57.5 kg)   SpO2 99%   BMI  23.19 kg/m     Wt Readings from Last 3 Encounters:  06/11/23 126 lb 12.8 oz (57.5 kg)  12/26/22 126 lb (57.2 kg)  12/20/22 125 lb (56.7 kg)     GEN:  Well nourished, well developed in no acute distress HEENT: Normal NECK: No JVD; No carotid bruits LYMPHATICS: No lymphadenopathy CARDIAC: RRR, no murmurs, no rubs, no gallops RESPIRATORY:  Clear to auscultation without rales, wheezing or rhonchi  ABDOMEN: Soft, non-tender, non-distended MUSCULOSKELETAL:  No edema; No deformity  SKIN: Warm and dry LOWER EXTREMITIES: no swelling NEUROLOGIC:  Alert and oriented x 3 PSYCHIATRIC:  Normal affect   ASSESSMENT:    1. Nonrheumatic aortic valve insufficiency   2. Primary hypertension   3. Aneurysm of ascending aorta without rupture (HCC)    PLAN:    In order of problems listed above:  Aortic insufficiency which is moderate.  Will continue monitoring.  She required echocardiogram in November. Essential hypertension blood pressure well-controlled continue present management. Dyslipidemia I did review K PN from last year which show LDL of 58 HDL 52.  Will repeat cholesterol profile.  In the meantime we will continue with Crestor. Aneurysm of the ascending aorta measuring only 41 mm we will continue present management.. I will schedule her to have echocardiogram done in November I see her back in 1 year.   Medication Adjustments/Labs and Tests Ordered: Current medicines are reviewed at length with the patient today.  Concerns regarding medicines are outlined above.  Orders Placed This Encounter  Procedures   EKG 12-Lead   ECHOCARDIOGRAM COMPLETE   Medication changes: No orders of the defined types were placed in this encounter.   Signed, Georgeanna Lea, MD, [facility] 06/11/2023 2:48 PM    [facility]

## 2023-06-20 ENCOUNTER — Encounter: Payer: Self-pay | Admitting: Orthopedic Surgery

## 2023-06-20 ENCOUNTER — Ambulatory Visit (HOSPITAL_BASED_OUTPATIENT_CLINIC_OR_DEPARTMENT_OTHER)
Admission: RE | Admit: 2023-06-20 | Discharge: 2023-06-20 | Disposition: A | Discharge status: Home / Self Care | Payer: Medicare Other | Source: Ambulatory Visit | Attending: Orthopaedic Surgery | Admitting: Orthopaedic Surgery

## 2023-06-20 DIAGNOSIS — S22060A Wedge compression fracture of T7-T8 vertebra, initial encounter for closed fracture: Secondary | ICD-10-CM | POA: Insufficient documentation

## 2023-06-26 LAB — LIPID PANEL
Chol/HDL Ratio: 2.9 ratio (ref 0.0–4.4)
Cholesterol, Total: 156 mg/dL (ref 100–199)
HDL: 53 mg/dL (ref >39)
LDL Chol Calc (NIH): 82 mg/dL (ref 0–99)
Triglycerides: 119 mg/dL (ref 0–149)
VLDL Cholesterol Cal: 21 mg/dL (ref 5–40)

## 2023-06-29 ENCOUNTER — Telehealth: Payer: Self-pay

## 2023-06-29 NOTE — Telephone Encounter (Signed)
Patient notified through my chart.

## 2023-06-29 NOTE — Telephone Encounter (Signed)
-----   Message from Gypsy Balsam sent at 06/29/2023 12:02 PM EDT ----- Cholesterol slightly higher than was before nothing dramatic.  May be some better eating habits and exercise will be sufficient

## 2023-07-05 LAB — LAB REPORT - SCANNED: EGFR: 87

## 2023-07-20 ENCOUNTER — Telehealth: Payer: Self-pay | Admitting: Family Medicine

## 2023-07-20 NOTE — Telephone Encounter (Signed)
Katie from [facility] called and requested to speak with nurse regarding the pt's appointment with them to complete a contrast-enhanced mammogram on 07/30/23. She wants to know if pcp would approve for the pt start pre-medications for benadryl and Prednisone as the pt stated that she is allergic to iodine. Please call and advise at [phone].

## 2023-07-20 NOTE — Telephone Encounter (Signed)
Called Katie back lvm from [facility] letting her know we got the message and have faxed  the form back them.

## 2023-07-23 ENCOUNTER — Other Ambulatory Visit (HOSPITAL_BASED_OUTPATIENT_CLINIC_OR_DEPARTMENT_OTHER): Payer: Self-pay | Admitting: Orthopaedic Surgery

## 2023-07-23 DIAGNOSIS — E214 Other specified disorders of parathyroid gland: Secondary | ICD-10-CM

## 2023-07-26 NOTE — Telephone Encounter (Signed)
Refax with correct number.

## 2023-07-26 NOTE — Telephone Encounter (Signed)
Katie from [facility] called back stating that they had not received the order. Katie advised the best fax number to send it to is:  [phone]  Advised a note would be sent back to refax this order to them.

## 2023-07-29 ENCOUNTER — Ambulatory Visit (HOSPITAL_BASED_OUTPATIENT_CLINIC_OR_DEPARTMENT_OTHER)
Admission: RE | Admit: 2023-07-29 | Discharge: 2023-07-29 | Disposition: A | Discharge status: Home / Self Care | Payer: Medicare Other | Source: Ambulatory Visit | Attending: Orthopaedic Surgery | Admitting: Orthopaedic Surgery

## 2023-07-29 DIAGNOSIS — E214 Other specified disorders of parathyroid gland: Secondary | ICD-10-CM | POA: Insufficient documentation

## 2023-07-30 ENCOUNTER — Other Ambulatory Visit: Payer: Self-pay | Admitting: Radiology

## 2023-07-30 LAB — HM MAMMOGRAPHY

## 2023-07-31 LAB — SURGICAL PATHOLOGY

## 2023-08-01 ENCOUNTER — Encounter: Payer: Self-pay | Admitting: Family Medicine

## 2023-08-16 ENCOUNTER — Ambulatory Visit: Payer: Medicare Other | Attending: Cardiology

## 2023-08-16 DIAGNOSIS — I7121 Aneurysm of the ascending aorta, without rupture: Secondary | ICD-10-CM

## 2023-08-16 DIAGNOSIS — I10 Essential (primary) hypertension: Secondary | ICD-10-CM

## 2023-08-16 DIAGNOSIS — I351 Nonrheumatic aortic (valve) insufficiency: Secondary | ICD-10-CM | POA: Diagnosis not present

## 2023-08-17 LAB — ECHOCARDIOGRAM COMPLETE
AR max vel: 2.46 cm2
AV Area VTI: 2.27 cm2
AV Area mean vel: 2.31 cm2
AV Mean grad: 6 mm[Hg]
AV Peak grad: 11.5 mm[Hg]
Ao pk vel: 1.69 m/s
Area-P 1/2: 4.01 cm2
P 1/2 time: 489 ms
S' Lateral: 3.3 cm

## 2023-08-21 ENCOUNTER — Other Ambulatory Visit: Payer: Medicare Other

## 2023-08-24 ENCOUNTER — Telehealth: Payer: Self-pay

## 2023-08-24 NOTE — Telephone Encounter (Signed)
Left message on My Chart with normal results per Dr. Krasowski's note. Routed to PCP. 

## 2023-08-28 ENCOUNTER — Telehealth: Payer: Self-pay

## 2023-08-28 ENCOUNTER — Encounter: Payer: Self-pay | Admitting: Family Medicine

## 2023-08-28 NOTE — Telephone Encounter (Signed)
Patient viewed results on My Chart per Dr. Vanetta Shawl note. Routed to PCP.

## 2023-09-06 ENCOUNTER — Encounter: Payer: Self-pay | Admitting: *Deleted

## 2023-09-11 ENCOUNTER — Other Ambulatory Visit (HOSPITAL_COMMUNITY): Payer: Self-pay | Admitting: Orthopaedic Surgery

## 2023-09-11 DIAGNOSIS — E214 Other specified disorders of parathyroid gland: Secondary | ICD-10-CM

## 2023-09-26 ENCOUNTER — Encounter (HOSPITAL_COMMUNITY)
Admission: RE | Admit: 2023-09-26 | Discharge: 2023-09-26 | Disposition: A | Discharge status: Home / Self Care | Payer: Medicare Other | Source: Ambulatory Visit | Attending: Orthopaedic Surgery | Admitting: Orthopaedic Surgery

## 2023-09-26 DIAGNOSIS — E214 Other specified disorders of parathyroid gland: Secondary | ICD-10-CM | POA: Insufficient documentation

## 2023-09-26 MED ORDER — TECHNETIUM TC 99M SESTAMIBI GENERIC - CARDIOLITE
25.0000 | Freq: Once | INTRAVENOUS | Status: AC | PRN
  Administered 2023-09-26: 25 via INTRAVENOUS

## 2023-10-04 ENCOUNTER — Ambulatory Visit: Payer: Medicare Other

## 2023-10-04 VITALS — Ht 62.0 in | Wt 126.0 lb

## 2023-10-04 DIAGNOSIS — Z0000 Encounter for general adult medical examination without abnormal findings: Secondary | ICD-10-CM

## 2023-10-04 NOTE — Patient Instructions (Addendum)
Yolanda Hernandez , Thank you for taking time to come for your Medicare Wellness Visit. I appreciate your ongoing commitment to your health goals. Please review the following plan we discussed and let me know if I can assist you in the future.   Referrals/Orders/Follow-Ups/Clinician Recommendations:   This is a list of the screening recommended for you and due dates:    Advanced directives: (In Chart) A copy of your advanced directives are scanned into your chart should your provider ever need it.  Next Medicare Annual Wellness Visit scheduled for next year: Yes

## 2023-10-04 NOTE — Progress Notes (Signed)
Subjective:   Yolanda Hernandez is a 67 y.o. female who presents for Medicare Annual (Subsequent) preventive examination.  Visit Complete: Virtual I connected with  Demetrios Loll on 10/04/23 by a audio enabled telemedicine application and verified that I am speaking with the correct person using two identifiers.  Patient Location: Home  Provider Location: Home Office  I discussed the limitations of evaluation and management by telemedicine. The patient expressed understanding and agreed to proceed.  Vital Signs: Because this visit was a virtual/telehealth visit, some criteria may be missing or patient reported. Any vitals not documented were not able to be obtained and vitals that have been documented are patient reported.  Patient Medicare AWV questionnaire was completed by the patient on 10/03/23; I have confirmed that all information answered by patient is correct and no changes since this date.  Cardiac Risk Factors include: hypertension;advanced age (>51men, >67 women)     Objective:    Today's Vitals   10/04/23 0824  Weight: 126 lb (57.2 kg)  Height: 5\' 2"  (1.575 m)   Body mass index is 23.05 kg/m.     10/04/2023    8:35 AM 09/26/2022    9:13 AM 09/20/2021    9:06 AM 05/14/2017    4:25 PM 03/12/2015   10:20 AM  Advanced Directives  Does Patient Have a Medical Advance Directive? Yes Yes Yes Yes Yes  Type of Estate agent of [place];Living will Healthcare Power of [place];Living will Healthcare Power of [place];Living will Healthcare Power of [place];Living will Healthcare Power of [place];Living will  Does patient want to make changes to medical advance directive? No - Patient declined No - Patient declined   Yes - information given  Copy of Healthcare Power of Attorney in Chart? Yes - validated most recent copy scanned in chart (See row information) Yes - validated most recent copy scanned in chart (See row information) Yes - validated most  recent copy scanned in chart (See row information)  No - copy requested    Current Medications (verified) Outpatient Encounter Medications as of 10/04/2023  Medication Sig   Abaloparatide (TYMLOS) 3120 MCG/1.56ML SOPN Inject 80 mcg into the skin daily.   hydrochlorothiazide (HYDRODIURIL) 12.5 MG tablet Take 12.5 mg by mouth daily.   Multiple Vitamins-Minerals (PRESERVISION AREDS 2+MULTI VIT PO) Take 1 tablet by mouth in the morning and at bedtime.   Probiotic Product (PROBIOTIC PO) Take 1 tablet by mouth daily.   rosuvastatin (CRESTOR) 10 MG tablet Take 1 tablet (10 mg total) by mouth daily.   tretinoin (RETIN-A) 0.05 % cream APPLY TOPICALLY AT BEDTIME   No facility-administered encounter medications on file as of 10/04/2023.    Allergies (verified) Iodine and Sulfa antibiotics   History: Past Medical History:  Diagnosis Date   Cerumen impaction    chronic   Cervical cancer screening 03/12/2015   Menarche at 10 Regular and moderate flow  history of abnormal pap in past, bx and cryo once many years ago, normal since. Last 2011 and normal G0P0, s/p No history of abnormal MGM Last in 2015, no breast concerns  No concerns today Biopsy, cervical: gyn surgeries Menopause at 53 ish   Conflict between patient and family 03/26/2021   Dysmenorrhea    Heart murmur 03/26/2021   Hematuria 07/31/2017   HPV in female 2019   Hyperglycemia 03/19/2013   Hypertension    Muscle cramping 03/26/2021   Osteopenia    Osteoporosis 04/11/2010   Qualifier: Diagnosis of  By: Artist Pais DO,  Barbette Hair    Other and unspecified hyperlipidemia 03/19/2013   Preventative health care 09/08/2011   Right calf pain 03/22/2021   Skin cancer    Skin cancer 07/14/2016   Uterine fibroid    Past Surgical History:  Procedure Laterality Date   BREAST SURGERY     biopsy left   COLPOSCOPY     GYNECOLOGIC CRYOSURGERY     MYRINGOPLASTY  1972   TONSILLECTOMY     Family History  Problem Relation Age of Onset    Osteoporosis Mother    Hyperlipidemia Mother    Hypertension Mother    Macular degeneration Mother    Arthritis Mother    Hearing loss Mother    Vision loss Mother    Heart disease Father    Diabetes Father    Hyperlipidemia Father    Hypertension Father    Asthma Father    Diabetes Sister    Heart disease Sister        s/p cardiac stent   Hyperlipidemia Sister    Hypertension Sister    Osteoporosis Sister    Colon cancer Maternal Uncle    Heart disease Paternal Uncle    Arthritis Maternal Grandmother    Vision loss Maternal Grandmother    Heart disease Paternal Grandfather    Kidney disease Other    Alcohol abuse Other    Esophageal cancer Neg Hx    Stomach cancer Neg Hx    Rectal cancer Neg Hx    Social History   Socioeconomic History   Marital status: Married    Spouse name: Not on file   Number of children: Not on file   Years of education: Not on file   Highest education level: Not on file  Occupational History   Not on file  Tobacco Use   Smoking status: Never   Smokeless tobacco: Never  Vaping Use   Vaping status: Never Used  Substance and Sexual Activity   Alcohol use: Yes    Comment: Less than 1 glass of wine or beer per week   Drug use: Never    Comment: older than 16, less than 5   Sexual activity: Yes    Birth control/protection: Post-menopausal  Other Topics Concern   Not on file  Social History Narrative   Not on file   Social Drivers of Health   Financial Resource Strain: Low Risk  (10/04/2023)   Overall Financial Resource Strain (CARDIA)    Difficulty of Paying Living Expenses: Not hard at all  Food Insecurity: No Food Insecurity (10/04/2023)   Hunger Vital Sign    Worried About Running Out of Food in the Last Year: Never true    Ran Out of Food in the Last Year: Never true  Transportation Needs: No Transportation Needs (10/04/2023)   PRAPARE - Administrator, Civil Service (Medical): No    Lack of Transportation  (Non-Medical): No  Physical Activity: Sufficiently Active (10/04/2023)   Exercise Vital Sign    Days of Exercise per Week: 4 days    Minutes of Exercise per Session: 60 min  Stress: No Stress Concern Present (10/04/2023)   [employer] of Occupational Health - Occupational Stress Questionnaire    Feeling of Stress : Not at all  Social Connections: Moderately Isolated (10/04/2023)   Social Connection and Isolation Panel [NHANES]    Frequency of Communication with Friends and Family: More than three times a week    Frequency of Social Gatherings with Friends and Family:  More than three times a week    Attends Religious Services: Never    Active Member of Clubs or Organizations: No    Attends Banker Meetings: Never    Marital Status: Married    Tobacco Counseling Counseling given: Not Answered   Clinical Intake:  Pre-visit preparation completed: Yes  Pain : No/denies pain     BMI - recorded: 23.05 Nutritional Status: BMI of 19-24  Normal Nutritional Risks: None Diabetes: No  How often do you need to have someone help you when you read instructions, pamphlets, or other written materials from your doctor or pharmacy?: 1 - Never  Interpreter Needed?: No  Information entered by :: Theresa Mulligan LPN   Activities of Daily Living    10/04/2023    8:30 AM 10/03/2023   10:00 AM  In your present state of health, do you have any difficulty performing the following activities:  Hearing? 0 1  Comment  Wears Hearing  Aids  Vision? 0 0  Difficulty concentrating or making decisions? 0 0  Walking or climbing stairs? 0 0  Dressing or bathing? 0 0  Doing errands, shopping? 0 0  Preparing Food and eating ?  N  Using the Toilet?  N  In the past six months, have you accidently leaked urine?  N  Do you have problems with loss of bowel control?  N  Managing your Medications?  N  Managing your Finances?  N  Housekeeping or managing your Housekeeping?  N     Patient Care Team: Bradd Canary, MD as PCP - General (Family Medicine) Romualdo Bolk, MD (Inactive) as Consulting Physician (Obstetrics and Gynecology)  Indicate any recent Medical Services you may have received from other than [facility] providers in the past year (date may be approximate).     Assessment:   This is a routine wellness examination for [place].  Hearing/Vision screen Hearing Screening - Comments:: Wears Hearing Aids Vision Screening - Comments:: Wears rx glasses Contacts - up to date with routine eye exams with  [facility]   Goals Addressed               This Visit's Progress     Maintain current exercise (pt-stated)         Depression Screen    10/04/2023   10:31 AM 09/26/2022    9:14 AM 03/23/2022    9:19 AM 09/20/2021    9:09 AM 03/22/2021    2:52 PM 08/22/2018    8:59 AM 07/14/2016    8:58 AM  PHQ 2/9 Scores  PHQ - 2 Score 0 0 0 0 0 0 0  PHQ- 9 Score      1     Fall Risk    10/04/2023    8:33 AM 10/03/2023   10:00 AM 09/26/2022    9:13 AM 03/23/2022    9:19 AM 09/20/2021    9:07 AM  Fall Risk   Falls in the past year? 0 0 0 0 0  Number falls in past yr: 0 0 0 0 0  Injury with Fall? 0 0 0 0 0  Risk for fall due to : No Fall Risks  No Fall Risks No Fall Risks   Follow up Falls prevention discussed  Follow up appointment Falls evaluation completed Falls prevention discussed    MEDICARE RISK AT HOME: Medicare Risk at Home Any stairs in or around the home?: (Patient-Rptd) Yes If so, are there any without handrails?: (Patient-Rptd) Yes  Home free of loose throw rugs in walkways, pet beds, electrical cords, etc?: (Patient-Rptd) Yes Adequate lighting in your home to reduce risk of falls?: (Patient-Rptd) Yes Life alert?: (Patient-Rptd) No Use of a cane, walker or w/c?: (Patient-Rptd) No Grab bars in the bathroom?: (Patient-Rptd) Yes Shower chair or bench in shower?: (Patient-Rptd) Yes Elevated toilet seat or a handicapped toilet?:  (Patient-Rptd) Yes  TIMED UP AND GO:  Was the test performed?  No    Cognitive Function:        10/04/2023    8:35 AM 09/26/2022    9:20 AM  6CIT Screen  What Year? 0 points 0 points  What month? 0 points 0 points  What time? 0 points 0 points  Count back from 20 0 points 0 points  Months in reverse 0 points 0 points  Repeat phrase 0 points 0 points  Total Score 0 points 0 points    Immunizations Immunization History  Administered Date(s) Administered   Fluad Quad(high Dose 65+) 07/16/2020, 07/27/2021, 07/28/2022   Influenza Inj Mdck Quad Pf 07/25/2019   Influenza,inj,Quad PF,6+ Mos 07/07/2013, 07/09/2015, 07/14/2016, 07/31/2017, 07/08/2018   Influenza-Unspecified 07/01/2023   PFIZER(Purple Top)SARS-COV-2 Vaccination 11/10/2019, 12/01/2019, 07/23/2020, 02/14/2021, 05/19/2022   PNEUMOCOCCAL CONJUGATE-20 11/16/2021   Pfizer Covid-19 Vaccine Bivalent Booster 80yrs & up 08/04/2021   Pneumococcal Conjugate-13 03/12/2015   Pneumococcal Polysaccharide-23 07/14/2016   Respiratory Syncytial Virus Vaccine,Recomb Aduvanted(Arexvy) 08/18/2022   Td 10/14/2008   Tdap 08/22/2018   Zoster Recombinant(Shingrix) 12/11/2017, 02/12/2018   Zoster, Live 10/23/2014    TDAP status: Up to date  Flu Vaccine status: Up to date  Pneumococcal vaccine status: Up to date  Covid-19 vaccine status: Declined, Education has been provided regarding the importance of this vaccine but patient still declined. Advised may receive this vaccine at local pharmacy or Health Dept.or vaccine clinic. Aware to provide a copy of the vaccination record if obtained from local pharmacy or Health Dept. Verbalized acceptance and understanding.  Qualifies for Shingles Vaccine? Yes   Zostavax completed Yes   Shingrix Completed?: Yes  Screening Tests Health Maintenance  Topic Date Due   COVID-19 Vaccine (7 - 2024-25 season) 06/17/2023   Medicare Annual Wellness (AWV)  10/03/2024   MAMMOGRAM  07/29/2025    DTaP/Tdap/Td (3 - Td or Tdap) 08/22/2028   Colonoscopy  07/27/2032   Pneumonia Vaccine 49+ Years old  Completed   INFLUENZA VACCINE  Completed   DEXA SCAN  Completed   Hepatitis C Screening  Completed   Zoster Vaccines- Shingrix  Completed   HPV VACCINES  Aged Out    Health Maintenance  Health Maintenance Due  Topic Date Due   COVID-19 Vaccine (7 - 2024-25 season) 06/17/2023    Colorectal cancer screening: Type of screening: Colonoscopy. Completed 07/27/22. Repeat every 10 years  Mammogram status: Completed 07/30/23. Repeat every year  Bone Density status: Completed 01/04/23. Results reflect: Bone density results: OSTEOPOROSIS. Repeat every   years.    Additional Screening:  Hepatitis C Screening: does qualify; Completed 07/14/16  Vision Screening: Recommended annual ophthalmology exams for early detection of glaucoma and other disorders of the eye. Is the patient up to date with their annual eye exam?  Yes  Who is the provider or what is the name of the office in which the patient attends annual eye exams? [facility] If pt is not established with a provider, would they like to be referred to a provider to establish care? No .   Dental Screening: Recommended annual dental exams  for proper oral hygiene    Community Resource Referral / Chronic Care Management:  CRR required this visit?  No   CCM required this visit?  No     Plan:     I have personally reviewed and noted the following in the patient's chart:   Medical and social history Use of alcohol, tobacco or illicit drugs  Current medications and supplements including opioid prescriptions. Patient is not currently taking opioid prescriptions. Functional ability and status Nutritional status Physical activity Advanced directives List of other physicians Hospitalizations, surgeries, and ER visits in previous 12 months Vitals Screenings to include cognitive, depression, and falls Referrals and  appointments  In addition, I have reviewed and discussed with patient certain preventive protocols, quality metrics, and best practice recommendations. A written personalized care plan for preventive services as well as general preventive health recommendations were provided to patient.     Tillie Rung, LPN   16/10/930   After Visit Summary: (MyChart) Due to this being a telephonic visit, the after visit summary with patients personalized plan was offered to patient via MyChart   Nurse Notes: Patient request f/u with concerns of elevated B/P X 1wk. Patient has office appt 10/30/23 for Physical.

## 2023-10-12 ENCOUNTER — Ambulatory Visit (INDEPENDENT_AMBULATORY_CARE_PROVIDER_SITE_OTHER): Payer: Medicare Other | Admitting: Family Medicine

## 2023-10-12 ENCOUNTER — Encounter: Payer: Self-pay | Admitting: Family Medicine

## 2023-10-12 VITALS — BP 132/68 | HR 80 | Temp 98.0°F | Resp 16 | Ht 62.0 in | Wt 128.4 lb

## 2023-10-12 DIAGNOSIS — L309 Dermatitis, unspecified: Secondary | ICD-10-CM | POA: Diagnosis not present

## 2023-10-12 MED ORDER — TRIAMCINOLONE ACETONIDE 0.1 % EX CREA: 1.0000 | TOPICAL_CREAM | Freq: Two times a day (BID) | CUTANEOUS | 0 refills | Status: DC

## 2023-10-12 NOTE — Progress Notes (Signed)
Chief Complaint  Patient presents with   Rash    Rash left Arm    Yolanda Hernandez is a 67 y.o. female here for a skin complaint.  Duration: 2 weeks Location: L upper arm Pruritic? No Painful? Yes Drainage? No New soaps/lotions/topicals/detergents? No Sick contacts? No Other associated symptoms: redness Therapies tried thus far: Aloe vera  Past Medical History:  Diagnosis Date   Cerumen impaction    chronic   Cervical cancer screening 03/12/2015   Menarche at 10 Regular and moderate flow  history of abnormal pap in past, bx and cryo once many years ago, normal since. Last 2011 and normal G0P0, s/p No history of abnormal MGM Last in 2015, no breast concerns  No concerns today Biopsy, cervical: gyn surgeries Menopause at 53 ish   Conflict between patient and family 03/26/2021   Dysmenorrhea    Heart murmur 03/26/2021   Hematuria 07/31/2017   HPV in female 2019   Hyperglycemia 03/19/2013   Hypertension    Muscle cramping 03/26/2021   Osteopenia    Osteoporosis 04/11/2010   Qualifier: Diagnosis of  By: Nena Jordan    Other and unspecified hyperlipidemia 03/19/2013   Preventative health care 09/08/2011   Right calf pain 03/22/2021   Skin cancer    Skin cancer 07/14/2016   Uterine fibroid     BP 132/68   Pulse 80   Temp 98 F (36.7 C) (Oral)   Resp 16   Ht 5\' 2"  (1.575 m)   Wt 128 lb 6.4 oz (58.2 kg)   SpO2 98%   BMI 23.48 kg/m  Gen: awake, alert, appearing stated age Lungs: No accessory muscle use Skin: Scaly plaque over mid upper left arm posteriorly.  Pink base.  Measuring approximately 1.4 x 1 cm in diameter.  No drainage, erythema, TTP, fluctuance, excoriation Psych: Age appropriate judgment and insight  Dermatitis - Plan: triamcinolone cream (KENALOG) 0.1 %  Does not look infectious/fungal.  Twice daily steroid cream.  Nonscented emollients.  Try not to touch the area often.  Send much is no improvement.  Would consider shave biopsy/excision. F/u  prn. The patient voiced understanding and agreement to the plan.  Jilda Roche [place], DO 10/12/23 7:15 AM

## 2023-10-12 NOTE — Patient Instructions (Addendum)
Let me know if things aren't improving.   Avoid scented products.  Try not to touch, poke or prod the area if possible.  Use a non-scented emollient twice daily.   Let us know if you need anything.

## 2023-10-29 NOTE — Assessment & Plan Note (Signed)
 Encouraged to get adequate exercise, calcium and vitamin d intake

## 2023-10-29 NOTE — Assessment & Plan Note (Signed)
 Well controlled, no changes to meds. Encouraged heart healthy diet such as the DASH diet and exercise as tolerated.

## 2023-10-29 NOTE — Assessment & Plan Note (Signed)
 Hydrate and monitor

## 2023-10-29 NOTE — Assessment & Plan Note (Signed)
 Encourage heart healthy diet such as MIND or DASH diet, increase exercise, avoid trans fats, simple carbohydrates and processed foods, consider a krill or fish or flaxseed oil cap daily. Tolerating Rosuvastatin

## 2023-10-29 NOTE — Assessment & Plan Note (Signed)
 Patient encouraged to maintain heart healthy diet, regular exercise, adequate sleep. Consider daily probiotics. Take medications as prescribed. Labs ordered and reviewed. Immunizations UTD Had Pneumovax 20. Last colonoscopy 2018 needs repeat in next year  Last [facility] 1//2024 repeat in next year. Last Dexa 11/2021 repeat in 2025-26. Last Pap 2021  Repeat 2024-25

## 2023-10-29 NOTE — Assessment & Plan Note (Signed)
 [id number] acceptable, minimize simple carbs. Increase exercise as tolerated.

## 2023-10-30 ENCOUNTER — Encounter: Payer: Self-pay | Admitting: Family Medicine

## 2023-10-30 ENCOUNTER — Ambulatory Visit (INDEPENDENT_AMBULATORY_CARE_PROVIDER_SITE_OTHER): Payer: Medicare Other | Admitting: Family Medicine

## 2023-10-30 VITALS — BP 146/80 | HR 89 | Temp 98.2°F | Resp 18 | Ht 62.0 in | Wt 126.2 lb

## 2023-10-30 DIAGNOSIS — M810 Age-related osteoporosis without current pathological fracture: Secondary | ICD-10-CM | POA: Diagnosis not present

## 2023-10-30 DIAGNOSIS — I10 Essential (primary) hypertension: Secondary | ICD-10-CM

## 2023-10-30 DIAGNOSIS — C50912 Malignant neoplasm of unspecified site of left female breast: Secondary | ICD-10-CM

## 2023-10-30 DIAGNOSIS — R252 Cramp and spasm: Secondary | ICD-10-CM | POA: Diagnosis not present

## 2023-10-30 DIAGNOSIS — E782 Mixed hyperlipidemia: Secondary | ICD-10-CM

## 2023-10-30 DIAGNOSIS — R739 Hyperglycemia, unspecified: Secondary | ICD-10-CM

## 2023-10-30 DIAGNOSIS — R531 Weakness: Secondary | ICD-10-CM

## 2023-10-30 DIAGNOSIS — Z0000 Encounter for general adult medical examination without abnormal findings: Secondary | ICD-10-CM | POA: Diagnosis not present

## 2023-10-30 NOTE — Assessment & Plan Note (Signed)
 Has tolerated her radiation is about to have her fifth treatment soon and then she will be done. She tolerated her lumpectomy

## 2023-10-30 NOTE — Patient Instructions (Addendum)
 Bone density shows osteopenia, which is thinner than normal but not as bad as osteoporosis. Recommend calcium  intake of 1200 to 1500 mg daily, divided into roughly 3 doses. Best source is the diet and a single dairy serving is about 500 mg, a supplement of calcium  citrate once or twice daily to balance diet is fine if not getting enough in diet. Also need Vitamin D  2000 IU caps, 1 cap daily if not already taking vitamin D . Also recommend weight baring exercise on hips and upper body to keep bones strong Preventive Care 65 Years and Older, Female Preventive care refers to lifestyle choices and visits with your health care provider that can promote health and wellness. Preventive care visits are also called wellness exams. What can I expect for my preventive care visit? Counseling Your health care provider may ask you questions about your: Medical history, including: Past medical problems. Family medical history. Pregnancy and menstrual history. History of falls. Current health, including: Memory and ability to understand (cognition). Emotional well-being. Home life and relationship well-being. Sexual activity and sexual health. Lifestyle, including: Alcohol, nicotine or tobacco, and drug use. Access to firearms. Diet, exercise, and sleep habits. Work and work astronomer. Sunscreen use. Safety issues such as seatbelt and bike helmet use. Physical exam Your health care provider will check your: Height and weight. These may be used to calculate your BMI (body mass index). BMI is a measurement that tells if you are at a healthy weight. Waist circumference. This measures the distance around your waistline. This measurement also tells if you are at a healthy weight and may help predict your risk of certain diseases, such as type 2 diabetes and high blood pressure. Heart rate and blood pressure. Body temperature. Skin for abnormal spots. What immunizations do I need?  Vaccines are usually  given at various ages, according to a schedule. Your health care provider will recommend vaccines for you based on your age, medical history, and lifestyle or other factors, such as travel or where you work. What tests do I need? Screening Your health care provider may recommend screening tests for certain conditions. This may include: Lipid and cholesterol levels. Hepatitis C test. Hepatitis B test. HIV (human immunodeficiency virus) test. STI (sexually transmitted infection) testing, if you are at risk. Lung cancer screening. Colorectal cancer screening. Diabetes screening. This is done by checking your blood sugar (glucose) after you have not eaten for a while (fasting). Mammogram. Talk with your health care provider about how often you should have regular mammograms. BRCA-related cancer screening. This may be done if you have a family history of breast, ovarian, tubal, or peritoneal cancers. Bone density scan. This is done to screen for osteoporosis. Talk with your health care provider about your test results, treatment options, and if necessary, the need for more tests. Follow these instructions at home: Eating and drinking  Eat a diet that includes fresh fruits and vegetables, whole grains, lean protein, and low-fat dairy products. Limit your intake of foods with high amounts of sugar, saturated fats, and salt. Take vitamin and mineral supplements as recommended by your health care provider. Do not drink alcohol if your health care provider tells you not to drink. If you drink alcohol: Limit how much you have to 0-1 drink a day. Know how much alcohol is in your drink. In the [place] one drink equals one 12 oz bottle of beer (355 mL), one 5 oz glass of wine (148 mL), or one 1 oz glass of hard  liquor (44 mL). Lifestyle Brush your teeth every morning and night with fluoride toothpaste. Floss one time each day. Exercise for at least 30 minutes 5 or more days each week. Do not use any  products that contain nicotine or tobacco. These products include cigarettes, chewing tobacco, and vaping devices, such as e-cigarettes. If you need help quitting, ask your health care provider. Do not use drugs. If you are sexually active, practice safe sex. Use a condom or other form of protection in order to prevent STIs. Take aspirin  only as told by your health care provider. Make sure that you understand how much to take and what form to take. Work with your health care provider to find out whether it is safe and beneficial for you to take aspirin  daily. Ask your health care provider if you need to take a cholesterol-lowering medicine (statin). Find healthy ways to manage stress, such as: Meditation, yoga, or listening to music. Journaling. Talking to a trusted person. Spending time with friends and family. Minimize exposure to UV radiation to reduce your risk of skin cancer. Safety Always wear your seat belt while driving or riding in a vehicle. Do not drive: If you have been drinking alcohol. Do not ride with someone who has been drinking. When you are tired or distracted. While texting. If you have been using any mind-altering substances or drugs. Wear a helmet and other protective equipment during sports activities. If you have firearms in your house, make sure you follow all gun safety procedures. What's next? Visit your health care provider once a year for an annual wellness visit. Ask your health care provider how often you should have your eyes and teeth checked. Stay up to date on all vaccines. This information is not intended to replace advice given to you by your health care provider. Make sure you discuss any questions you have with your health care provider. Document Revised: 03/30/2021 Document Reviewed: 03/30/2021 Elsevier Patient Education  2024 [employer].

## 2023-10-31 LAB — COMPREHENSIVE METABOLIC PANEL
ALT: 12 U/L (ref 0–35)
AST: 16 U/L (ref 0–37)
Albumin: 4.6 g/dL (ref 3.5–5.2)
Alkaline Phosphatase: 73 U/L (ref 39–117)
BUN: 16 mg/dL (ref 6–23)
CO2: 31 meq/L (ref 19–32)
Calcium: 9.8 mg/dL (ref 8.4–10.5)
Chloride: 101 meq/L (ref 96–112)
Creatinine, Ser: 0.74 mg/dL (ref 0.40–1.20)
GFR: 82.78 mL/min (ref >60.00)
Glucose, Bld: 81 mg/dL (ref 70–99)
Potassium: 3.7 meq/L (ref 3.5–5.1)
Sodium: 141 meq/L (ref 135–145)
Total Bilirubin: 0.6 mg/dL (ref 0.2–1.2)
Total Protein: 6.5 g/dL (ref 6.0–8.3)

## 2023-10-31 LAB — MAGNESIUM: Magnesium: 1.9 mg/dL (ref 1.5–2.5)

## 2023-10-31 LAB — TSH: TSH: 3.31 u[IU]/mL (ref 0.35–5.50)

## 2023-10-31 LAB — LIPID PANEL
Cholesterol: 155 mg/dL (ref 0–200)
HDL: 57.6 mg/dL (ref >39.00)
LDL Cholesterol: 78 mg/dL (ref 0–99)
NonHDL: 97.14
Total CHOL/HDL Ratio: 3
Triglycerides: 95 mg/dL (ref 0.0–149.0)
VLDL: 19 mg/dL (ref 0.0–40.0)

## 2023-10-31 LAB — CBC WITH DIFFERENTIAL/PLATELET
Basophils Absolute: 0.1 10*3/uL (ref 0.0–0.1)
Basophils Relative: 0.8 % (ref 0.0–3.0)
Eosinophils Absolute: 0 10*3/uL (ref 0.0–0.7)
Eosinophils Relative: 0.6 % (ref 0.0–5.0)
HCT: 43.8 % (ref 36.0–46.0)
Hemoglobin: 14.9 g/dL (ref 12.0–15.0)
Lymphocytes Relative: 23.3 % (ref 12.0–46.0)
Lymphs Abs: 1.5 10*3/uL (ref 0.7–4.0)
MCHC: 34 g/dL (ref 30.0–36.0)
MCV: 93.8 fL (ref 78.0–100.0)
Monocytes Absolute: 0.5 10*3/uL (ref 0.1–1.0)
Monocytes Relative: 8 % (ref 3.0–12.0)
Neutro Abs: 4.5 10*3/uL (ref 1.4–7.7)
Neutrophils Relative %: 67.3 % (ref 43.0–77.0)
Platelets: 360 10*3/uL (ref 150.0–400.0)
RBC: 4.67 Mil/uL (ref 3.87–5.11)
RDW: 12.3 % (ref 11.5–15.5)
WBC: 6.6 10*3/uL (ref 4.0–10.5)

## 2023-10-31 LAB — VITAMIN D 25 HYDROXY (VIT D DEFICIENCY, FRACTURES): VITD: 20.49 ng/mL — ABNORMAL LOW (ref 30.00–100.00)

## 2023-10-31 LAB — HEMOGLOBIN A1C: Hgb A1c MFr Bld: 5.9 % (ref 4.6–6.5)

## 2023-10-31 NOTE — Progress Notes (Signed)
 Subjective:    Patient ID: Yolanda Hernandez, female    DOB: 03/26/1956, 68 y.o.   MRN: [id number]  Chief Complaint  Patient presents with   Annual Exam    HPI Discussed the use of AI scribe software for clinical note transcription with the patient, who gave verbal consent to proceed.  History of Present Illness   The patient, with a history of hypertension, osteoporosis, and recent breast cancer diagnosis, presents with concerns about her bone health and high blood pressure. She reports a compression fracture in the past year and expresses fear of future fractures. She has been walking a lot but has been hesitant to engage in strength training due to fear of injury. She has been taking hydrochlorothiazide for high calcium  levels in her urine and is due to start tamoxifen for her breast cancer. She also mentions that her blood pressure readings at home have been in the 130s-140s systolic, which she is concerned about. She is also due for a PET scan soon. She reports no chest pain, palpitations, or new stomach trouble.        Past Medical History:  Diagnosis Date   Cerumen impaction    chronic   Cervical cancer screening 03/12/2015   Menarche at 10 Regular and moderate flow  history of abnormal pap in past, bx and cryo once many years ago, normal since. Last 2011 and normal G0P0, s/p No history of abnormal MGM Last in 2015, no breast concerns  No concerns today Biopsy, cervical: gyn surgeries Menopause at 53 ish   Conflict between patient and family 03/26/2021   Dysmenorrhea    Heart murmur 03/26/2021   Hematuria 07/31/2017   HPV in female 2019   Hyperglycemia 03/19/2013   Hypertension    Muscle cramping 03/26/2021   Osteopenia    Osteoporosis 04/11/2010   Qualifier: Diagnosis of  By: Georgian ROSALEA CHARM Lamar    Other and unspecified hyperlipidemia 03/19/2013   Preventative health care 09/08/2011   Right calf pain 03/22/2021   Skin cancer    Skin cancer 07/14/2016   Uterine fibroid      Past Surgical History:  Procedure Laterality Date   BREAST SURGERY     biopsy left   COLPOSCOPY     GYNECOLOGIC CRYOSURGERY     MYRINGOPLASTY  1972   TONSILLECTOMY      Family History  Problem Relation Age of Onset   Osteoporosis Mother    Hyperlipidemia Mother    Hypertension Mother    Macular degeneration Mother    Arthritis Mother    Hearing loss Mother    Vision loss Mother    Heart disease Father    Diabetes Father    Hyperlipidemia Father    Hypertension Father    Asthma Father    Diabetes Sister    Heart disease Sister        s/p cardiac stent   Hyperlipidemia Sister    Hypertension Sister    Osteoporosis Sister    Colon cancer Maternal Uncle    Heart disease Paternal Uncle    Arthritis Maternal Grandmother    Vision loss Maternal Grandmother    Heart disease Paternal Grandfather    Kidney disease Other    Alcohol abuse Other    Esophageal cancer Neg Hx    Stomach cancer Neg Hx    Rectal cancer Neg Hx     Social History   Socioeconomic History   Marital status: Married    Spouse name: Not on  file   Number of children: Not on file   Years of education: Not on file   Highest education level: Master's degree (e.g., MA, MS, MEng, MEd, MSW, MBA)  Occupational History   Not on file  Tobacco Use   Smoking status: Never   Smokeless tobacco: Never  Vaping Use   Vaping status: Never Used  Substance and Sexual Activity   Alcohol use: Yes    Comment: Less than 1 glass of wine or beer per week   Drug use: Never    Comment: older than 15, less than 5   Sexual activity: Yes    Birth control/protection: Post-menopausal  Other Topics Concern   Not on file  Social History Narrative   Not on file   Social Drivers of Health   Financial Resource Strain: Low Risk  (10/11/2023)   Overall Financial Resource Strain (CARDIA)    Difficulty of Paying Living Expenses: Not hard at all  Food Insecurity: No Food Insecurity (10/11/2023)   Hunger Vital Sign     Worried About Running Out of Food in the Last Year: Never true    Ran Out of Food in the Last Year: Never true  Transportation Needs: No Transportation Needs (10/11/2023)   PRAPARE - Administrator, Civil Service (Medical): No    Lack of Transportation (Non-Medical): No  Physical Activity: Sufficiently Active (10/11/2023)   Exercise Vital Sign    Days of Exercise per Week: 5 days    Minutes of Exercise per Session: 60 min  Stress: No Stress Concern Present (10/11/2023)   [employer] of Occupational Health - Occupational Stress Questionnaire    Feeling of Stress : Only a little  Social Connections: Socially Integrated (10/11/2023)   Social Connection and Isolation Panel [NHANES]    Frequency of Communication with Friends and Family: More than three times a week    Frequency of Social Gatherings with Friends and Family: Once a week    Attends Religious Services: More than 4 times per year    Active Member of [employer] or Organizations: Yes    Attends Engineer, Structural: More than 4 times per year    Marital Status: Married  Recent Concern: Social Connections - Moderately Isolated (10/04/2023)   Social Connection and Isolation Panel [NHANES]    Frequency of Communication with Friends and Family: More than three times a week    Frequency of Social Gatherings with Friends and Family: More than three times a week    Attends Religious Services: Never    Database Administrator or Organizations: No    Attends Banker Meetings: Never    Marital Status: Married  Catering Manager Violence: Unknown (12/04/2022)   Received from [employer], [facility]   HITS    Physically Hurt: Not on file    Insult or Talk Down To: Not on file    Threaten Physical Harm: Not on file    Scream or Curse: Not on file    Outpatient Medications Prior to Visit  Medication Sig Dispense Refill   Abaloparatide (TYMLOS) 3120 MCG/1.56ML SOPN Inject 80 mcg into the skin daily.      hydrochlorothiazide (HYDRODIURIL) 12.5 MG tablet Take 12.5 mg by mouth daily.     Multiple Vitamins-Minerals (PRESERVISION AREDS 2+MULTI VIT PO) Take 1 tablet by mouth in the morning and at bedtime.     Probiotic Product (PROBIOTIC PO) Take 1 tablet by mouth daily.     rosuvastatin  (CRESTOR ) 10 MG  tablet Take 1 tablet (10 mg total) by mouth daily. 90 tablet 3   tretinoin  (RETIN-A ) 0.05 % cream APPLY TOPICALLY AT BEDTIME 45 g 5   triamcinolone  cream (KENALOG ) 0.1 % Apply 1 Application topically 2 (two) times daily. 30 g 0   No facility-administered medications prior to visit.    Allergies  Allergen Reactions   Iodine Rash   Sulfa Antibiotics Rash    Review of Systems  Constitutional:  Negative for chills, fever and malaise/fatigue.  HENT:  Negative for congestion and hearing loss.   Eyes:  Negative for discharge.  Respiratory:  Negative for cough, sputum production and shortness of breath.   Cardiovascular:  Negative for chest pain, palpitations and leg swelling.  Gastrointestinal:  Negative for abdominal pain, blood in stool, constipation, diarrhea, heartburn, nausea and vomiting.  Genitourinary:  Negative for dysuria, frequency, hematuria and urgency.  Musculoskeletal:  Positive for back pain. Negative for falls and myalgias.  Skin:  Negative for rash.  Neurological:  Negative for dizziness, sensory change, loss of consciousness, weakness and headaches.  Endo/Heme/Allergies:  Negative for environmental allergies. Does not bruise/bleed easily.  Psychiatric/Behavioral:  Negative for depression and suicidal ideas. The patient is nervous/anxious. The patient does not have insomnia.        Objective:    Physical Exam Constitutional:      General: She is not in acute distress.    Appearance: Normal appearance. She is not diaphoretic.  HENT:     Head: Normocephalic and atraumatic.     Right Ear: Tympanic membrane, ear canal and external ear normal.     Left Ear: Tympanic  membrane, ear canal and external ear normal.     Nose: Nose normal.     Mouth/Throat:     Mouth: Mucous membranes are moist.     Pharynx: Oropharynx is clear. No oropharyngeal exudate.  Eyes:     General: No scleral icterus.       Right eye: No discharge.        Left eye: No discharge.     Conjunctiva/sclera: Conjunctivae normal.     Pupils: Pupils are equal, round, and reactive to light.  Neck:     Thyroid : No thyromegaly.  Cardiovascular:     Rate and Rhythm: Normal rate and regular rhythm.     Heart sounds: Normal heart sounds. No murmur heard. Pulmonary:     Effort: Pulmonary effort is normal. No respiratory distress.     Breath sounds: Normal breath sounds. No wheezing or rales.  Abdominal:     General: Bowel sounds are normal. There is no distension.     Palpations: Abdomen is soft. There is no mass.     Tenderness: There is no abdominal tenderness.  Musculoskeletal:        General: No tenderness. Normal range of motion.     Cervical back: Normal range of motion and neck supple.  Lymphadenopathy:     Cervical: No cervical adenopathy.  Skin:    General: Skin is warm and dry.     Findings: No rash.  Neurological:     General: No focal deficit present.     Mental Status: She is alert and oriented to person, place, and time.     Cranial Nerves: No cranial nerve deficit.     Coordination: Coordination normal.     Deep Tendon Reflexes: Reflexes are normal and symmetric. Reflexes normal.  Psychiatric:        Mood and Affect: Mood normal.  Behavior: Behavior normal.        Thought Content: Thought content normal.        Judgment: Judgment normal.     BP (!) 146/80 (BP Location: Right Arm, Patient Position: Sitting, Cuff Size: Normal)   Pulse 89   Temp 98.2 F (36.8 C) (Oral)   Resp 18   Ht 5' 2 (1.575 m)   Wt 126 lb 3.2 oz (57.2 kg)   SpO2 98%   BMI 23.08 kg/m  Wt Readings from Last 3 Encounters:  10/30/23 126 lb 3.2 oz (57.2 kg)  10/12/23 128 lb 6.4 oz  (58.2 kg)  10/04/23 126 lb (57.2 kg)    Diabetic Foot Exam - Simple   No data filed    Lab Results  Component Value Date   WBC 6.6 10/30/2023   HGB 14.9 10/30/2023   HCT 43.8 10/30/2023   PLT 360.0 10/30/2023   GLUCOSE 81 10/30/2023   CHOL 155 10/30/2023   TRIG 95.0 10/30/2023   HDL 57.60 10/30/2023   LDLCALC 78 10/30/2023   ALT 12 10/30/2023   AST 16 10/30/2023   NA 141 10/30/2023   K 3.7 10/30/2023   CL 101 10/30/2023   CREATININE 0.74 10/30/2023   BUN 16 10/30/2023   CO2 31 10/30/2023   TSH 3.31 10/30/2023   HGBA1C 5.9 10/30/2023    Lab Results  Component Value Date   TSH 3.31 10/30/2023   Lab Results  Component Value Date   WBC 6.6 10/30/2023   HGB 14.9 10/30/2023   HCT 43.8 10/30/2023   MCV 93.8 10/30/2023   PLT 360.0 10/30/2023   Lab Results  Component Value Date   NA 141 10/30/2023   K 3.7 10/30/2023   CO2 31 10/30/2023   GLUCOSE 81 10/30/2023   BUN 16 10/30/2023   CREATININE 0.74 10/30/2023   BILITOT 0.6 10/30/2023   ALKPHOS 73 10/30/2023   AST 16 10/30/2023   ALT 12 10/30/2023   PROT 6.5 10/30/2023   ALBUMIN 4.6 10/30/2023   CALCIUM  9.8 10/30/2023   GFR 82.78 10/30/2023   Lab Results  Component Value Date   CHOL 155 10/30/2023   Lab Results  Component Value Date   HDL 57.60 10/30/2023   Lab Results  Component Value Date   LDLCALC 78 10/30/2023   Lab Results  Component Value Date   TRIG 95.0 10/30/2023   Lab Results  Component Value Date   CHOLHDL 3 10/30/2023   Lab Results  Component Value Date   HGBA1C 5.9 10/30/2023       Assessment & Plan:  Hyperglycemia Assessment & Plan: hgba1c acceptable, minimize simple carbs. Increase exercise as tolerated.   Orders: -     Hemoglobin A1c; Future  Hyperlipidemia, mixed Assessment & Plan: Encourage heart healthy diet such as MIND or DASH diet, increase exercise, avoid trans fats, simple carbohydrates and processed foods, consider a krill or fish or flaxseed oil cap daily.  Tolerating Rosuvastatin   Orders: -     Lipid panel; Future  Primary hypertension Assessment & Plan: Well controlled, no changes to meds. Encouraged heart healthy diet such as the DASH diet and exercise as tolerated.   Orders: -     CBC with Differential/Platelet; Future -     Comprehensive metabolic panel; Future -     TSH; Future -     Magnesium; Future  Muscle cramping Assessment & Plan: Hydrate and monitor   Orders: -     Magnesium; Future  Preventative health care Assessment &  Plan: Patient encouraged to maintain heart healthy diet, regular exercise, adequate sleep. Consider daily probiotics. Take medications as prescribed. Labs ordered and reviewed. Immunizations UTD Had Pneumovax 20. Last colonoscopy 2018 needs repeat in next year  Last [facility] 1//2024 repeat in next year. Last Dexa 11/2021 repeat in 2025-26. Last Pap 2021  Repeat 2024-25   Osteoporosis, unspecified osteoporosis type, unspecified pathological fracture presence Assessment & Plan: Encouraged to get adequate exercise, calcium  and vitamin d  intake   Orders: -     VITAMIN D  25 Hydroxy (Vit-D Deficiency, Fractures); Future  Malignant neoplasm of left female breast, unspecified estrogen receptor status, unspecified site of breast [facility]) Assessment & Plan: Has tolerated her radiation is about to have her fifth treatment soon and then she will be done. She tolerated her lumpectomy   Weakness -     Ambulatory referral to Physical Therapy    Assessment and Plan    Breast Cancer Discussed management of the stress of being a cancer survivor and her husband having Head and Neck Cancer -Continue regular counseling sessions. -Next PET scan for husband scheduled for 11/01/2023.  Hypertension Home blood pressure readings in the 130s-140s systolic with normal diastolic readings. Currently on Hydrochlorothiazide 12.5mg . -Recheck blood pressure in office. -Advise patient to continue monitoring blood pressure at home  weekly and report readings. -If systolic blood pressure spikes over 180, patient can take an additional Hydrochlorothiazide dose and notify the office.  Osteoporosis History of compression fracture. Patient is concerned about strength and risk of future fractures. Currently taking calcium  and vitamin D  through diet. -Refer to physical therapy for strength training and fall prevention. -Continue weight-bearing exercises like walking.  General Health Maintenance -Order metabolic panel, CBC, A1c, and thyroid  function tests. -Advise patient to schedule gynecological exam in March due to tamoxifen use and risk of endometrial hypertrophy. -Schedule follow-up appointments for 6 and 12 months. -Draw blood for ordered tests today.         Harlene Horton, MD

## 2023-11-01 ENCOUNTER — Other Ambulatory Visit: Payer: Self-pay | Admitting: Emergency Medicine

## 2023-11-01 DIAGNOSIS — R7989 Other specified abnormal findings of blood chemistry: Secondary | ICD-10-CM

## 2023-11-01 MED ORDER — VITAMIN D (ERGOCALCIFEROL) 1.25 MG (50000 UNIT) PO CAPS: 50000.0000 [IU] | ORAL_CAPSULE | ORAL | 4 refills | Status: DC

## 2023-11-02 NOTE — Therapy (Signed)
OUTPATIENT PHYSICAL THERAPY EVALUATION   Patient Name: Yolanda Hernandez MRN: [id number] DOB:03/30/1956, 68 y.o., female Today's Date: 11/08/2023  END OF SESSION:  PT End of Session - 11/08/23 1151     Visit Number 1    Date for PT Re-Evaluation 01/03/24    Authorization Type [employer] Medicare    Authorization Time Period Prior Auth pending: 11/08/23 - 01/03/24    PT Start Time 1151    PT Stop Time 1254    PT Time Calculation (min) 63 min    Activity Tolerance Patient tolerated treatment well;Patient limited by pain    Behavior During Therapy [facility] for tasks assessed/performed             Past Medical History:  Diagnosis Date   Cerumen impaction    chronic   Cervical cancer screening 03/12/2015   Menarche at 10 Regular and moderate flow  history of abnormal pap in past, bx and cryo once many years ago, normal since. Last 2011 and normal G0P0, s/p No history of abnormal MGM Last in 2015, no breast concerns  No concerns today Biopsy, cervical: gyn surgeries Menopause at 53 ish   Conflict between patient and family 03/26/2021   Dysmenorrhea    Heart murmur 03/26/2021   Hematuria 07/31/2017   HPV in female 2019   Hyperglycemia 03/19/2013   Hypertension    Muscle cramping 03/26/2021   Osteopenia    Osteoporosis 04/11/2010   Qualifier: Diagnosis of  By: Nena Jordan    Other and unspecified hyperlipidemia 03/19/2013   Preventative health care 09/08/2011   Right calf pain 03/22/2021   Skin cancer    Skin cancer 07/14/2016   Uterine fibroid    Past Surgical History:  Procedure Laterality Date   BREAST SURGERY     biopsy left   COLPOSCOPY     GYNECOLOGIC CRYOSURGERY     MYRINGOPLASTY  1972   TONSILLECTOMY     Patient Active Problem List   Diagnosis Date Noted   Breast cancer, left (HCC) 10/30/2023   Weakness 03/20/2023   Closed wedge compression fracture of T8 vertebra (HCC) 03/20/2023   Chronic bilateral thoracic back pain 03/20/2023   Abnormal posture 03/20/2023    Thoracic compression fracture (HCC) 11/13/2022   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 04/24/2022   History of COVID-19 03/23/2022   Aortic insufficiency 06/08/2021   Ascending aortic aneurysm (HCC) 06/08/2021   Hypertension 06/07/2021   Conflict between patient and family 03/26/2021   Heart murmur 03/26/2021   Muscle cramping 03/26/2021   Right calf pain 03/22/2021   Excessive cerumen in right ear canal 01/14/2018   HPV in female 2019   Hematuria 07/31/2017   Asymmetric SNHL (sensorineural hearing loss) 11/29/2015   Subjective tinnitus of left ear 11/29/2015   Sensorineural hearing loss (SNHL), bilateral 11/29/2015   Cervical cancer screening 03/12/2015   Contact dermatitis 07/07/2013   Hyperlipidemia, mixed 03/19/2013   Hyperglycemia 03/19/2013   Osteopenia 03/19/2013   Preventative health care 09/08/2011   Osteoporosis 04/11/2010   FIBROIDS, UTERUS 03/07/2010   ELEVATED BLOOD PRESSURE WITHOUT DIAGNOSIS OF HYPERTENSION 03/07/2010   SKIN CANCER, HX OF 03/07/2010    PCP: Bradd Canary, MD   REFERRING PROVIDER: Bradd Canary, MD   REFERRING DIAG: R53.1 (ICD-10-CM) - Weakness  (Osteoporosis, falls and weakness)  THERAPY DIAG:  Muscle weakness (generalized)  Pain in thoracic spine  Pain of right upper arm  Stiffness of right shoulder, not elsewhere classified  Stiffness of left shoulder,  not elsewhere classified  RATIONALE FOR EVALUATION AND TREATMENT: Rehabilitation  ONSET DATE: 10/2022 - T8 compression fracture; 08/2023 - R upper arm pain & L lumpectomy and axillary node dissection secondary to breast cancer  NEXT MD VISIT: 05/01/24   SUBJECTIVE:   SUBJECTIVE STATEMENT: Pt reports h/o T8 compression fracture ~1 yr ago (11/13/22) while participating in a gym based exercise program focusing on osteoporosis (she describes standing in slight squat while lifting a resistance machine in front of her when she felt a pop in her mid back).  She was initially told  it was a muscle strain and only later identified as a compression fracture on x-ray.  She did complete an episode of PT following this with some benefit noted but later when trying to 3 reduce the exercises at home started to notice limitation due to R upper arm pain.  She has been walking and doing some weightbearing exercises but has not progressed her strengthening and would like to do so.  In November she was diagnosed with breast cancer and underwent a lumpectomy and axillary node dissection on the left for which she was prescribed a HEP to prevent lymphedema.  She she continues to note some pain in her R upper arm over the past few months which limits her ability to reach with her R UE, perform her lymphedema exercises or sleep on her R side.  PAIN: Are you having pain? Yes: NPRS scale: 0/10 currently, up to 5/10 Pain location: mid back (~T8) Pain description: stiff, achy, uncomfortable Aggravating factors: cleaning (scrubbing the tub, vacuuming, esp when she does a lot in a day), otherwise unpredictable Relieving factors: heating pad, Tylenol  Are you having pain? Yes: NPRS scale: 0/10 currently, up to 4-5/10 Pain location: R lateral upper arm/shoulder Pain description: quick onset, pulling Aggravating factors: laying on R side, overhead shoulder ER (pec stretch), end range OH ROM Relieving factors: stopping triggering movement  PERTINENT HISTORY: T8 compression fracture 10/2022, osteoporosis, chronic B thoracic back pain, HTN, muscle cramping, L breast cancer, aortic insufficiency, ascending aortic aneurysm, B sensorineural hearing loss  PRECAUTIONS: Other: Osteoporosis with h/o thoracic compression fx  HAND DOMINANCE: Right  RED FLAGS: None  WEIGHT BEARING RESTRICTIONS: No  FALLS:  Has patient fallen in last 6 months? 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 To get rid of the arm pain and minimize the back pain. To stay active."   OBJECTIVE: (objective measures completed at initial evaluation unless otherwise dated)  DIAGNOSTIC FINDINGS:  06/20/23 - MR thoracic spine: IMPRESSION: 1. No acute osseous injury of the thoracic spine. 2. Chronic T8 vertebral body compression fracture with 50% height loss anteriorly.  04/18/23 - XR thoracic spine: T8 compression fracture with anterior height loss.   No significant change in height loss since films on 12/12/2022. No other  fracture seen.  No spondylolisthesis seen.   PATIENT SURVEYS:  ABC scale 1390 / 1600 = 86.9 %, > 80% indicates high level of physical functioning Modified Oswestry 15 / 50 = 30.0 %, indicates  moderate disability  Quick Dash 31.8 / 100 = 31.8 %  COGNITION: Overall cognitive status: Within functional limits for tasks assessed     SENSATION: WFL  POSTURE:  rounded shoulders, forward head, and increased thoracic kyphosis  CERVICAL ROM:  Grossly WFL/WNL  UPPER EXTREMITY ROM:   Active ROM Right eval Left eval  Shoulder flexion 120 124  Shoulder extension 40 ^ 46  Shoulder abduction 142 ^ 138  Shoulder adduction    Shoulder internal rotation FIR L3 ^ FIR T12  Shoulder external rotation FER T3 ^ FER T3  Elbow flexion    Elbow extension    Wrist flexion    Wrist extension    Wrist ulnar deviation    Wrist radial deviation    Wrist pronation    Wrist supination    (Blank rows = not tested, ^ - increased pain)  UPPER EXTREMITY MMT:  MMT Right eval Left eval  Shoulder flexion 4 4  Shoulder extension 4 4  Shoulder abduction 4 4   Shoulder adduction    Shoulder internal rotation 4+ 4+  Shoulder external rotation 4 4  Middle trapezius 4- 4-  Lower trapezius Unable Unable  Elbow flexion    Elbow extension    Wrist flexion    Wrist extension    Wrist ulnar deviation    Wrist radial deviation    Wrist pronation    Wrist supination    Grip strength (lbs)    (Blank rows = not tested)  MUSCLE LENGTH: Hamstrings: Mild tight B ITB: WFL Piriformis: Mild tight B Hip flexors: WFL Quads: Mild tight B  LOWER EXTREMITY ROM: Grossly WFL/WNL  LOWER EXTREMITY MMT:  MMT Right eval Left eval  Hip flexion 4- 4-  Hip extension 4+ 4+  Hip abduction 4- 4-  Hip adduction 4- 4-  Hip internal rotation 4+ 4+  Hip external rotation 3+ 3+  Knee flexion 5 5  Knee extension 5 5  Ankle dorsiflexion 5 5  Ankle plantarflexion    Ankle inversion    Ankle eversion     (Blank rows = not tested)  BED MOBILITY:  Independent  TRANSFERS: Assistive device utilized: None  Sit to stand: Complete Independence Stand to sit: Complete Independence Chair to chair: Complete Independence Floor:  NT  GAIT: Distance walked: Clinic distances Assistive device utilized: None Level of assistance: Complete Independence Gait pattern: WFL For  TODAY'S TREATMENT:  11/08/2023 - Eval THERAPEUTIC EXERCISE: to improve flexibility, strength and mobility.  Demonstration, verbal and tactile cues throughout for technique.  Gentle low pec stretch at doorway with shoulders at ~45-60 abduction - attempted with B and unilateral approach - patient preferring B Standing YTB scapular retraction + B shoulder row to neutral (emphasis on scapular motion/activation) 10 x 5"   PATIENT EDUCATION:  Education details: PT eval findings, anticipated POC, and initial HEP Person educated: Patient Education method: Explanation, Demonstration, Tactile cues, Verbal cues, and Handouts Education comprehension: verbalized understanding, returned demonstration,  verbal cues required, tactile cues required, and needs further education  HOME EXERCISE PROGRAM: Access Code: [email or web address] URL: [email or web address] Date: 11/08/2023 Prepared by: Glenetta Hew  Exercises - Doorway Pec Stretch at 60 Degrees Abduction with Arm Straight  - 2 x daily - 7 x weekly - 3 reps - 30 sec hold - Standing Bilateral Low Shoulder Row with Anchored Resistance  - 1 x daily - 7 x weekly - 2 sets - 10 reps - 5 sec hold  ASSESSMENT:  CLINICAL IMPRESSION: SHAKAILA MCMAHON is  a 68 y.o. female who was referred to physical therapy for evaluation and treatment for weakness related to osteoporosis.   She reports history of T8 compression fracture 1 year ago while participating in an exercise program designed to reduce risk for osteoporotic fractures and now is uncertain on how to progress strengthening without putting herself at risk for future fracture.  She continues to experience mid back pain.  On Modified Oswestry patient scored 15 /50 demonstrating 30% or moderate disability.  She currently walks for exercises and completes weightbearing exercises but would like to be able to safely advance her strengthening.  She also notes recent onset of R upper arm pain originating around the time she underwent lumpectomy and axillary node dissection for breast cancer.  On QuickDASH patient demonstrated 31.8% disability.  Current deficits include pain as described above, postural abnormalities, core and postural weakness with limited proximal UE ROM and strength as well as proximal LE weakness.  Chazmine will benefit from skilled PT to address above deficits to improve mobility and activity tolerance while safely progressing strengthening with decreased pain interference.   OBJECTIVE IMPAIRMENTS: decreased activity tolerance, decreased knowledge of condition, difficulty walking, decreased ROM, decreased strength, decreased safety awareness, hypomobility, increased fascial restrictions,  impaired perceived functional ability, increased muscle spasms, impaired flexibility, impaired UE functional use, improper body mechanics, postural dysfunction, and pain.   ACTIVITY LIMITATIONS: carrying, lifting, bending, sitting, standing, reach over head, locomotion level, and caring for others  PARTICIPATION LIMITATIONS: meal prep, cleaning, laundry, driving, shopping, community activity, and yard work  PERSONAL FACTORS: Past/current experiences, Time since onset of injury/illness/exacerbation, and 3+ comorbidities: T8 compression fracture 10/2022, osteoporosis, chronic B thoracic back pain, HTN, muscle cramping, L breast cancer, aortic insufficiency, ascending aortic aneurysm, B sensorineural hearing loss  are also affecting patient's functional outcome.   REHAB POTENTIAL: Excellent  CLINICAL DECISION MAKING: Evolving/moderate complexity  EVALUATION COMPLEXITY: Moderate   GOALS: Goals reviewed with patient? Yes  SHORT TERM GOALS: Target date: 12/06/2023   Patient will be independent with initial HEP. Baseline: HEP initiated on eval Goal status: INITIAL  2.  Patient will report 25% improvement in mid back & R UE pain to improve QOL. Baseline: Thoracic back pain up to 5/10, R upper arm pain up to 4-5/10 Goal status: INITIAL  LONG TERM GOALS: Target date: 01/03/2024  Patient will be independent with ongoing/advanced HEP for self-management at home.  Baseline:  Goal status: INITIAL  2.  Patient will report 50-75% improvement in low back and R UE pain to improve QOL.  Baseline:  Thoracic back pain up to 5/10, R upper arm pain up to 4-5/10 Goal status: INITIAL  3.  Patient to demonstrate awareness of osteoporosis precautions as well as ability to achieve and maintain good spinal alignment/posturing and body mechanics needed for daily activities. Baseline:  Goal status: INITIAL  4.  Patient will demonstrate functional pain free B shoulder ROM to perform ADLs.   Baseline: Refer  to above UE ROM table Goal status: INITIAL  5  Patient will demonstrate improved B shoulder and scapular strength to >/= 4+/5 for functional UE use Baseline: Refer to above UE MMT table Goal status: INITIAL   6.  Patient will demonstrate improved B LE strength to >/= 4+/5 for improved stability and ease of mobility. Baseline: Refer to above LE MMT table Goal status: INITIAL  7.  Patient will report </= 22% on QuickDASH (MCID = 10 pts) to demonstrate improved functional UE use with decreased pain interference. Baseline: 31.8 /  100 = 31.8 % Goal status: INITIAL  8. Patient will report </= 18% on Modified Oswestry (MCID = 12%) to demonstrate improved functional ability with decreased pain interference. Baseline: 15 / 50 = 30.0 % Goal status: INITIAL   PLAN:  PT FREQUENCY: 2x/week  PT DURATION: 8 weeks  PLANNED INTERVENTIONS: 97164- PT Re-evaluation, 97110-Therapeutic exercises, 97530- Therapeutic activity, [id number]- Neuromuscular re-education, 97535- Self Care, [id number]- Manual therapy, [id number]- Gait training, [phone]- Aquatic Therapy, 97014- Electrical stimulation (unattended), [phone]- Electrical stimulation (manual), 97016- Vasopneumatic device, [id number]- Ultrasound, [id number]- Ionotophoresis 4mg /ml Dexamethasone, Patient/Family education, Balance training, Stair training, Taping, Dry Needling, Joint mobilization, Spinal mobilization, Cryotherapy, and Moist heat  PLAN FOR NEXT SESSION: Review initial HEP as well as HEP provided for lymphedema prevention; provide education on osteoporosis precautions as well as proper posture and body mechanics; gently progress postural stretching and strengthening as well as core and proximal LE strengthening; MT +/- TPDN to address abnormal muscle tension restricting posture and R shoulder ROM; modalities as needed for pain management   Marry Guan, PT 11/08/2023, 1:56 PM     Date of referral: 10/30/2023 Referring provider: Bradd Canary, MD Referring  diagnosis? R53.1 (ICD-10-CM) - Weakness  (Osteoporosis, falls and weakness) Treatment diagnosis? (if different than referring diagnosis)  Muscle weakness (generalized)  Pain in thoracic spine  Pain of right upper arm  Stiffness of right shoulder, not elsewhere classified  Stiffness of left shoulder, not elsewhere classified  What was this (referring dx) caused by? Ongoing Issue and Other: Weakness related to osteoporosis with T8 compression fracture  Ashby Dawes of Condition: Chronic (continuous duration > 3 months)   Laterality: Both  Current Functional Measure Score: Back Index 15 / 50 = 30.0 %  & QuickDASH 31.8 / 100 = 31.8 %  Objective measurements identify impairments when they are compared to normal values, the uninvolved extremity, and prior level of function.  [x]  Yes  []  No  Objective assessment of functional ability: Moderate functional limitations   Briefly describe symptoms: Thoracic back pain from history of T8 compression fracture 1 year ago.  She also notes recent onset of R upper arm pain originating around the time she underwent lumpectomy and axillary node dissection for breast cancer. Current deficits include pain as described above, postural abnormalities, core and postural weakness with limited proximal UE ROM and strength as well as proximal LE weakness.    How did symptoms start: Sudden onset for thoracic back pain at time of T8 compression fracture on 11/13/2022.  Gradual onset for R upper arm pain.  Weakness as a sequelae to both.  Average pain intensity:  Last 24 hours: 3/10  Past week: 4-5/10  How often does the pt experience symptoms? Frequently  How much have the symptoms interfered with usual daily activities? Moderately  How has condition changed since care began at this facility? NA - initial visit  In general, how is the patients overall health? Good  Onset date: 10/2022 - T8 compression fracture; 08/2023 - R upper arm pain & L lumpectomy and  axillary node dissection secondary to breast cancer   BACK PAIN (STarT Back Screening Tool) - (When applicable):  Has your back pain spread down your leg(s) at sometime in the last 2 weeks? []  Yes   [x]  No Have you had pain in the shoulder or neck at sometime in the past 2 weeks? [x]  Yes   []  No Have you only walked short distances because of your back pain? [x]  Yes   []  No In  the past 2 weeks, have you dressed more slowly than usual because of your back pain? []  Yes   [x]  No Do you think it is not really safe for person with a condition like yours to be physically active? []  Yes   [x]  No Have worrying thoughts been going through your mind a lot of the time? []  Yes   [x]  No Do you feel that your back pain is terrible and it is never going to get any better? []  Yes   [x]  No In general, have you stopped enjoying all the things you usually enjoy? [x]  Yes   []  No Overall, how bothersome has your back pain been in the last 2 weeks? []  Not at all   []  Slightly     [x]  Moderate   []  Very much     []  Extremely

## 2023-11-08 ENCOUNTER — Encounter: Payer: Self-pay | Admitting: Physical Therapy

## 2023-11-08 ENCOUNTER — Ambulatory Visit: Payer: Medicare Other | Attending: Family Medicine | Admitting: Physical Therapy

## 2023-11-08 DIAGNOSIS — M6281 Muscle weakness (generalized): Secondary | ICD-10-CM | POA: Diagnosis present

## 2023-11-08 DIAGNOSIS — R531 Weakness: Secondary | ICD-10-CM | POA: Insufficient documentation

## 2023-11-08 DIAGNOSIS — M25612 Stiffness of left shoulder, not elsewhere classified: Secondary | ICD-10-CM | POA: Insufficient documentation

## 2023-11-08 DIAGNOSIS — M25611 Stiffness of right shoulder, not elsewhere classified: Secondary | ICD-10-CM | POA: Insufficient documentation

## 2023-11-08 DIAGNOSIS — M79621 Pain in right upper arm: Secondary | ICD-10-CM | POA: Diagnosis present

## 2023-11-08 DIAGNOSIS — M546 Pain in thoracic spine: Secondary | ICD-10-CM | POA: Diagnosis present

## 2023-11-14 ENCOUNTER — Encounter: Payer: Self-pay | Admitting: Physical Therapy

## 2023-11-14 ENCOUNTER — Ambulatory Visit: Payer: Medicare Other | Admitting: Physical Therapy

## 2023-11-14 DIAGNOSIS — M25611 Stiffness of right shoulder, not elsewhere classified: Secondary | ICD-10-CM

## 2023-11-14 DIAGNOSIS — M546 Pain in thoracic spine: Secondary | ICD-10-CM

## 2023-11-14 DIAGNOSIS — M6281 Muscle weakness (generalized): Secondary | ICD-10-CM

## 2023-11-14 DIAGNOSIS — M25612 Stiffness of left shoulder, not elsewhere classified: Secondary | ICD-10-CM

## 2023-11-14 DIAGNOSIS — M79621 Pain in right upper arm: Secondary | ICD-10-CM

## 2023-11-14 NOTE — Therapy (Addendum)
OUTPATIENT PHYSICAL THERAPY TREATMENT   Patient Name: Yolanda Hernandez MRN: [id number] DOB:04-Dec-1955, 68 y.o., female Today's Date: 11/14/2023  END OF SESSION:  PT End of Session - 11/14/23 1108     Visit Number 2    Date for PT Re-Evaluation 01/03/24    Authorization Type [employer]    Authorization Time Period 11/08/23 - 01/03/24    Authorization - Visit Number 1    Authorization - Number of Visits 6    PT Start Time 1108    PT Stop Time 1154    PT Time Calculation (min) 46 min    Activity Tolerance Patient tolerated treatment well;Patient limited by pain    Behavior During Therapy [facility] for tasks assessed/performed              Past Medical History:  Diagnosis Date   Cerumen impaction    chronic   Cervical cancer screening 03/12/2015   Menarche at 10 Regular and moderate flow  history of abnormal pap in past, bx and cryo once many years ago, normal since. Last 2011 and normal G0P0, s/p No history of abnormal MGM Last in 2015, no breast concerns  No concerns today Biopsy, cervical: gyn surgeries Menopause at 53 ish   Conflict between patient and family 03/26/2021   Dysmenorrhea    Heart murmur 03/26/2021   Hematuria 07/31/2017   HPV in female 2019   Hyperglycemia 03/19/2013   Hypertension    Muscle cramping 03/26/2021   Osteopenia    Osteoporosis 04/11/2010   Qualifier: Diagnosis of  By: Nena Jordan    Other and unspecified hyperlipidemia 03/19/2013   Preventative health care 09/08/2011   Right calf pain 03/22/2021   Skin cancer    Skin cancer 07/14/2016   Uterine fibroid    Past Surgical History:  Procedure Laterality Date   BREAST SURGERY     biopsy left   COLPOSCOPY     GYNECOLOGIC CRYOSURGERY     MYRINGOPLASTY  1972   TONSILLECTOMY     Patient Active Problem List   Diagnosis Date Noted   Breast cancer, left (HCC) 10/30/2023   Weakness 03/20/2023   Closed wedge compression fracture of T8 vertebra (HCC) 03/20/2023   Chronic bilateral thoracic  back pain 03/20/2023   Abnormal posture 03/20/2023   Thoracic compression fracture (HCC) 11/13/2022   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 04/24/2022   History of COVID-19 03/23/2022   Aortic insufficiency 06/08/2021   Ascending aortic aneurysm (HCC) 06/08/2021   Hypertension 06/07/2021   Conflict between patient and family 03/26/2021   Heart murmur 03/26/2021   Muscle cramping 03/26/2021   Right calf pain 03/22/2021   Excessive cerumen in right ear canal 01/14/2018   HPV in female 2019   Hematuria 07/31/2017   Asymmetric SNHL (sensorineural hearing loss) 11/29/2015   Subjective tinnitus of left ear 11/29/2015   Sensorineural hearing loss (SNHL), bilateral 11/29/2015   Cervical cancer screening 03/12/2015   Contact dermatitis 07/07/2013   Hyperlipidemia, mixed 03/19/2013   Hyperglycemia 03/19/2013   Osteopenia 03/19/2013   Preventative health care 09/08/2011   Osteoporosis 04/11/2010   FIBROIDS, UTERUS 03/07/2010   ELEVATED BLOOD PRESSURE WITHOUT DIAGNOSIS OF HYPERTENSION 03/07/2010   SKIN CANCER, HX OF 03/07/2010    PCP: Bradd Canary, MD   REFERRING PROVIDER: Bradd Canary, MD   REFERRING DIAG: R53.1 (ICD-10-CM) - Weakness  (Osteoporosis, falls and weakness)  THERAPY DIAG:  Muscle weakness (generalized)  Pain in thoracic spine  Pain of right  upper arm  Stiffness of right shoulder, not elsewhere classified  Stiffness of left shoulder, not elsewhere classified  RATIONALE FOR EVALUATION AND TREATMENT: Rehabilitation  ONSET DATE: 10/2022 - T8 compression fracture; 08/2023 - R upper arm pain & L lumpectomy and axillary node dissection secondary to breast cancer  NEXT MD VISIT: 05/01/24   SUBJECTIVE:   SUBJECTIVE STATEMENT: Pt reports she was unable to locate the handout for her lymphedema HEP but recalls the exercises well.   EVAL: Pt reports h/o T8 compression fracture ~1 yr ago (11/13/22) while participating in a gym based exercise program focusing  on osteoporosis (she describes standing in slight squat while lifting a resistance machine in front of her when she felt a pop in her mid back).  She was initially told it was a muscle strain and only later identified as a compression fracture on x-ray.  She did complete an episode of PT following this with some benefit noted but later when trying to 3 reduce the exercises at home started to notice limitation due to R upper arm pain.  She has been walking and doing some weightbearing exercises but has not progressed her strengthening and would like to do so.  In November she was diagnosed with breast cancer and underwent a lumpectomy and axillary node dissection on the left for which she was prescribed a HEP to prevent lymphedema.  She she continues to note some pain in her R upper arm over the past few months which limits her ability to reach with her R UE, perform her lymphedema exercises or sleep on her R side.  PAIN: Are you having pain? Yes: NPRS scale: 0/10 currently, up to 5/10 Pain location: mid back (~T8) Pain description: stiff, achy, uncomfortable Aggravating factors: cleaning (scrubbing the tub, vacuuming, esp when she does a lot in a day), otherwise unpredictable Relieving factors: heating pad, Tylenol  Are you having pain? Yes: NPRS scale: 0/10 currently, up to 4-5/10 Pain location: R lateral upper arm/shoulder Pain description: quick onset, pulling Aggravating factors: laying on R side, overhead shoulder ER (pec stretch), end range OH ROM Relieving factors: stopping triggering movement  PERTINENT HISTORY: T8 compression fracture 10/2022, osteoporosis, chronic B thoracic back pain, HTN, muscle cramping, L breast cancer, aortic insufficiency, ascending aortic aneurysm, B sensorineural hearing loss  PRECAUTIONS: Other: Osteoporosis with h/o thoracic compression fx  HAND DOMINANCE: Right  RED FLAGS: None  WEIGHT BEARING RESTRICTIONS: No  FALLS:  Has patient fallen in last 6  months? 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 To get rid of the arm pain and minimize the back pain. To stay active."   OBJECTIVE: (objective measures completed at initial evaluation unless otherwise dated)  DIAGNOSTIC FINDINGS:  06/20/23 - MR thoracic spine: IMPRESSION: 1. No acute osseous injury of the thoracic spine. 2. Chronic T8 vertebral body compression fracture with 50% height loss anteriorly.  04/18/23 - XR thoracic spine: T8 compression fracture with anterior height loss.   No significant change in height loss since films on 12/12/2022. No other  fracture seen.  No spondylolisthesis seen.   PATIENT SURVEYS:  ABC scale 1390 / 1600 = 86.9 %, > 80% indicates high level of physical functioning Modified Oswestry 15 / 50 = 30.0 %, indicates moderate disability  Quick Dash 31.8 / 100 = 31.8 %  COGNITION: Overall cognitive status: Within functional limits for tasks assessed     SENSATION: WFL  POSTURE:  rounded shoulders, forward head, and increased thoracic kyphosis  CERVICAL ROM:  Grossly WFL/WNL  UPPER EXTREMITY ROM:   Active ROM Right eval Left eval  Shoulder flexion 120 124  Shoulder extension 40 ^ 46  Shoulder abduction 142 ^ 138  Shoulder adduction    Shoulder internal rotation FIR L3 ^ FIR T12  Shoulder external rotation FER T3 ^ FER T3  Elbow flexion    Elbow extension    Wrist flexion    Wrist extension    Wrist ulnar deviation    Wrist radial deviation    Wrist pronation    Wrist supination     (Blank rows = not tested, ^ - increased pain)  UPPER EXTREMITY MMT:  MMT Right eval Left eval  Shoulder flexion 4 4  Shoulder extension 4 4  Shoulder abduction 4 4  Shoulder adduction    Shoulder internal rotation 4+ 4+  Shoulder external rotation 4 4  Middle trapezius 4- 4-  Lower trapezius Unable Unable  Elbow flexion    Elbow extension    Wrist flexion    Wrist extension    Wrist ulnar deviation    Wrist radial deviation    Wrist pronation    Wrist supination    Grip strength (lbs)    (Blank rows = not tested)  MUSCLE LENGTH: Hamstrings: Mild tight B ITB: WFL Piriformis: Mild tight B Hip flexors: WFL Quads: Mild tight B  LOWER EXTREMITY ROM: Grossly WFL/WNL  LOWER EXTREMITY MMT:  MMT Right eval Left eval  Hip flexion 4- 4-  Hip extension 4+ 4+  Hip abduction 4- 4-  Hip adduction 4- 4-  Hip internal rotation 4+ 4+  Hip external rotation 3+ 3+  Knee flexion 5 5  Knee extension 5 5  Ankle dorsiflexion 5 5  Ankle plantarflexion    Ankle inversion    Ankle eversion     (Blank rows = not tested)  BED MOBILITY:  Independent  TRANSFERS: Assistive device utilized: None  Sit to stand: Complete Independence Stand to sit: Complete Independence Chair to chair: Complete Independence Floor:  NT  GAIT: Distance walked: Clinic distances Assistive device utilized: None Level of assistance: Complete Independence Gait pattern: WFL For  TODAY'S TREATMENT:  11/14/2023  THERAPEUTIC EXERCISE: to improve flexibility, strength and mobility.  Demonstration, verbal and tactile cues throughout for technique.  Rec bike - L2 x 6 min Brief review of lymphedema HEP Thoracic extension over back of chair with arms folded on chest 10 x 5" Gentle low B pec stretch at doorway with shoulders at ~45-60 abduction & arms straight 2 x 30" Supine wand B shoulder ER (field goal position) 10 x 3" Supine wand B shoulder flexion 10 x 3 Seated YTB scap retraction + B shoulder ER x  10 - increased discomfort reported Seated YTB scap retraction + B shoulder horiz ABD x 10 - increased discomfort reported R & L unilateral shoulder flexion wall slides  SELF CARE:  Provided education on osteoporosis "do's and don'ts" with exercises and activity to reduce fracture risk.    11/08/2023  THERAPEUTIC EXERCISE: to improve flexibility,  strength and mobility.  Demonstration, verbal and tactile cues throughout for technique.  Gentle low pec stretch at doorway with shoulders at ~45-60 abduction - attempted with B and unilateral approach - patient preferring B Standing YTB scapular retraction + B shoulder row to neutral (emphasis on scapular motion/activation) 10 x 5"   PATIENT EDUCATION:  Education details: PT eval findings, anticipated POC, and initial HEP Person educated: Patient Education method: Explanation, Demonstration, Tactile cues, Verbal cues, and Handouts Education comprehension: verbalized understanding, returned demonstration, verbal cues required, tactile cues required, and needs further education  HOME EXERCISE PROGRAM: Access Code: [email or web address] URL: [email or web address] Date: 11/14/2023 Prepared by: Glenetta Hew  Exercises - Doorway Pec Stretch at 60 Degrees Abduction with Arm Straight  - 2 x daily - 7 x weekly - 3 reps - 30 sec hold - Standing Bilateral Low Shoulder Row with Anchored Resistance  - 1 x daily - 7 x weekly - 2 sets - 10 reps - 5 sec hold - Seated Thoracic Lumbar Extension  - 1-2 x daily - 7 x weekly - 2 sets - 10 reps - 3 sec hold - Supine Shoulder External Rotation with Dowel  - 1-2 x daily - 7 x weekly - 2 sets - 10 reps - 3 sec hold - Supine Shoulder Flexion AAROM with Dowel  - 1-2 x daily - 7 x weekly - 2 sets - 10 reps - 3 sec hold - Supine Shoulder Protraction with Dowel  - 1-2 x daily - 7 x weekly - 2 sets - 10 reps - 3 sec hold - Shoulder Flexion Wall Slide with Towel  - 1-2 x daily - 7 x weekly - 2 sets - 10 reps - 3 sec  hold  Patient Education - Do it right & prevent fractures - Osteoporosis education   ASSESSMENT:  CLINICAL IMPRESSION: Yolanda Hernandez was unable to locate her L UE lymphedema HEP handout but briefly demonstrated the exercise from memory.  PT providing guidance in modifications to address issues that she identified with home performance as well as which exercises might also work well for her R shoulder deficits.  Instruction provided in gentle AAROM/stretching for shoulder flexion and ER with wand.  Attempted light resisted postural strengthening but some increased discomfort noted, therefore deferred from HEP update.  Will continue to progress postural, core and proximal UE/LE strengthening in upcoming visits. Provided overview of osteoporosis precautions and contraindications, but we will plan on more extensive training and posture and body mechanics next visit.  Yolanda Hernandez will benefit from skilled PT to address above deficits to improve mobility and activity tolerance while safely progressing strengthening with decreased pain interference.   OBJECTIVE IMPAIRMENTS: decreased activity tolerance, decreased knowledge of condition, difficulty walking, decreased ROM, decreased strength, decreased safety awareness, hypomobility, increased fascial restrictions, impaired perceived functional ability, increased muscle spasms, impaired flexibility, impaired UE functional use, improper body mechanics, postural dysfunction, and pain.   ACTIVITY LIMITATIONS: carrying, lifting, bending, sitting, standing, reach over head, locomotion level, and caring for others  PARTICIPATION LIMITATIONS: meal prep, cleaning, laundry, driving, shopping, community activity, and yard work  PERSONAL FACTORS: Past/current experiences, Time since onset of injury/illness/exacerbation, and 3+ comorbidities: T8 compression fracture 10/2022, osteoporosis, chronic B thoracic back pain, HTN, muscle cramping, L breast cancer, aortic insufficiency,  ascending aortic aneurysm, B sensorineural hearing loss  are also affecting patient's functional outcome.   REHAB POTENTIAL: Excellent  CLINICAL DECISION MAKING: Evolving/moderate complexity  EVALUATION COMPLEXITY: Moderate   GOALS: Goals reviewed with patient? Yes  SHORT TERM  GOALS: Target date: 12/06/2023   Patient will be independent with initial HEP. Baseline: HEP initiated on eval Goal status: IN PROGRESS  2.  Patient will report 25% improvement in mid back & R UE pain to improve QOL. Baseline: Thoracic back pain up to 5/10, R upper arm pain up to 4-5/10 Goal status: IN PROGRESS  LONG TERM GOALS: Target date: 01/03/2024  Patient will be independent with ongoing/advanced HEP for self-management at home.  Baseline:  Goal status: IN PROGRESS  2.  Patient will report 50-75% improvement in low back and R UE pain to improve QOL.  Baseline:  Thoracic back pain up to 5/10, R upper arm pain up to 4-5/10 Goal status: IN PROGRESS  3.  Patient to demonstrate awareness of osteoporosis precautions as well as ability to achieve and maintain good spinal alignment/posturing and body mechanics needed for daily activities. Baseline:  Goal status: IN PROGRESS  4.  Patient will demonstrate functional pain free B shoulder ROM to perform ADLs.   Baseline: Refer to above UE ROM table Goal status: IN PROGRESS  5  Patient will demonstrate improved B shoulder and scapular strength to >/= 4+/5 for functional UE use Baseline: Refer to above UE MMT table Goal status: IN PROGRESS   6.  Patient will demonstrate improved B LE strength to >/= 4+/5 for improved stability and ease of mobility. Baseline: Refer to above LE MMT table Goal status: IN PROGRESS  7.  Patient will report </= 22% on QuickDASH (MCID = 10 pts) to demonstrate improved functional UE use with decreased pain interference. Baseline: 31.8 / 100 = 31.8 % Goal status: IN PROGRESS  8. Patient will report </= 18% on Modified Oswestry  (MCID = 12%) to demonstrate improved functional ability with decreased pain interference. Baseline: 15 / 50 = 30.0 % Goal status: IN PROGRESS   PLAN:  PT FREQUENCY: 2x/week  PT DURATION: 8 weeks  PLANNED INTERVENTIONS: 97164- PT Re-evaluation, 97110-Therapeutic exercises, 97530- Therapeutic activity, [id number]- Neuromuscular re-education, 97535- Self Care, 16109- Manual therapy, [id number]- Gait training, [phone]- Aquatic Therapy, 97014- Electrical stimulation (unattended), [phone]- Electrical stimulation (manual), 97016- Vasopneumatic device, [id number]- Ultrasound, [id number]- Ionotophoresis 4mg /ml Dexamethasone, Patient/Family education, Balance training, Stair training, Taping, Dry Needling, Joint mobilization, Spinal mobilization, Cryotherapy, and Moist heat  PLAN FOR NEXT SESSION: Expand on education on osteoporosis precautions as well as proper posture and body mechanics; gently progress postural stretching and strengthening as well as core and proximal LE strengthening; review & update HEP PRN; MT +/- TPDN to address abnormal muscle tension restricting posture and R shoulder ROM; modalities as needed for pain management   Marry Guan, PT 11/14/2023, 2:05 PM     Date of referral: 10/30/2023 Referring provider: Bradd Canary, MD Referring diagnosis? R53.1 (ICD-10-CM) - Weakness  (Osteoporosis, falls and weakness) Treatment diagnosis? (if different than referring diagnosis)  Muscle weakness (generalized)  Pain in thoracic spine  Pain of right upper arm  Stiffness of right shoulder, not elsewhere classified  Stiffness of left shoulder, not elsewhere classified  What was this (referring dx) caused by? Ongoing Issue and Other: Weakness related to osteoporosis with T8 compression fracture  Ashby Dawes of Condition: Chronic (continuous duration > 3 months)   Laterality: Both  Current Functional Measure Score: Back Index 15 / 50 = 30.0 %  & QuickDASH 31.8 / 100 = 31.8 %  Objective measurements  identify impairments when they are compared to normal values, the uninvolved extremity, and prior level of function.  [x]  Yes  []  No  Objective assessment of functional ability: Moderate functional limitations   Briefly describe symptoms: Thoracic back pain from history of T8 compression fracture 1 year ago.  She also notes recent onset of R upper arm pain originating around the time she underwent lumpectomy and axillary node dissection for breast cancer. Current deficits include pain as described above, postural abnormalities, core and postural weakness with limited proximal UE ROM and strength as well as proximal LE weakness.    How did symptoms start: Sudden onset for thoracic back pain at time of T8 compression fracture on 11/13/2022.  Gradual onset for R upper arm pain.  Weakness as a sequelae to both.  Average pain intensity:  Last 24 hours: 3/10  Past week: 4-5/10  How often does the pt experience symptoms? Frequently  How much have the symptoms interfered with usual daily activities? Moderately  How has condition changed since care began at this facility? NA - initial visit  In general, how is the patients overall health? Good  Onset date: 10/2022 - T8 compression fracture; 08/2023 - R upper arm pain & L lumpectomy and axillary node dissection secondary to breast cancer   BACK PAIN (STarT Back Screening Tool) - (When applicable):  Has your back pain spread down your leg(s) at sometime in the last 2 weeks? []  Yes   [x]  No Have you had pain in the shoulder or neck at sometime in the past 2 weeks? [x]  Yes   []  No Have you only walked short distances because of your back pain? [x]  Yes   []  No In the past 2 weeks, have you dressed more slowly than usual because of your back pain? []  Yes   [x]  No Do you think it is not really safe for person with a condition like yours to be physically active? []  Yes   [x]  No Have worrying thoughts been going through your mind a lot of the  time? []  Yes   [x]  No Do you feel that your back pain is terrible and it is never going to get any better? []  Yes   [x]  No In general, have you stopped enjoying all the things you usually enjoy? [x]  Yes   []  No Overall, how bothersome has your back pain been in the last 2 weeks? []  Not at all   []  Slightly     [x]  Moderate   []  Very much     []  Extremely

## 2023-11-20 ENCOUNTER — Ambulatory Visit: Payer: Medicare Other | Attending: Family Medicine | Admitting: Physical Therapy

## 2023-11-20 ENCOUNTER — Encounter: Payer: Self-pay | Admitting: Physical Therapy

## 2023-11-20 DIAGNOSIS — M6281 Muscle weakness (generalized): Secondary | ICD-10-CM

## 2023-11-20 DIAGNOSIS — M25612 Stiffness of left shoulder, not elsewhere classified: Secondary | ICD-10-CM | POA: Diagnosis present

## 2023-11-20 DIAGNOSIS — M25611 Stiffness of right shoulder, not elsewhere classified: Secondary | ICD-10-CM | POA: Diagnosis present

## 2023-11-20 DIAGNOSIS — M79621 Pain in right upper arm: Secondary | ICD-10-CM

## 2023-11-20 DIAGNOSIS — M546 Pain in thoracic spine: Secondary | ICD-10-CM | POA: Diagnosis present

## 2023-11-20 NOTE — Therapy (Signed)
 OUTPATIENT PHYSICAL THERAPY TREATMENT   Patient Name: Yolanda Hernandez MRN: [id number] DOB:Aug 13, 1956, 68 y.o., female Today's Date: 11/20/2023  END OF SESSION:  PT End of Session - 11/20/23 1145     Visit Number 3    Date for PT Re-Evaluation 01/03/24    Authorization Type [employer] Medicare    Authorization Time Period 11/08/23 - 01/03/24    Authorization - Visit Number 2    Authorization - Number of Visits 6    PT Start Time 1145    PT Stop Time 1241    PT Time Calculation (min) 56 min    Activity Tolerance Patient tolerated treatment well    Behavior During Therapy [facility] for tasks assessed/performed              Past Medical History:  Diagnosis Date   Cerumen impaction    chronic   Cervical cancer screening 03/12/2015   Menarche at 10 Regular and moderate flow  history of abnormal pap in past, bx and cryo once many years ago, normal since. Last 2011 and normal G0P0, s/p No history of abnormal MGM Last in 2015, no breast concerns  No concerns today Biopsy, cervical: gyn surgeries Menopause at 53 ish   Conflict between patient and family 03/26/2021   Dysmenorrhea    Heart murmur 03/26/2021   Hematuria 07/31/2017   HPV in female 2019   Hyperglycemia 03/19/2013   Hypertension    Muscle cramping 03/26/2021   Osteopenia    Osteoporosis 04/11/2010   Qualifier: Diagnosis of  By: Georgian ROSALEA CHARM Lamar    Other and unspecified hyperlipidemia 03/19/2013   Preventative health care 09/08/2011   Right calf pain 03/22/2021   Skin cancer    Skin cancer 07/14/2016   Uterine fibroid    Past Surgical History:  Procedure Laterality Date   BREAST SURGERY     biopsy left   COLPOSCOPY     GYNECOLOGIC CRYOSURGERY     MYRINGOPLASTY  1972   TONSILLECTOMY     Patient Active Problem List   Diagnosis Date Noted   Breast cancer, left (HCC) 10/30/2023   Weakness 03/20/2023   Closed wedge compression fracture of T8 vertebra (HCC) 03/20/2023   Chronic bilateral thoracic back pain 03/20/2023    Abnormal posture 03/20/2023   Thoracic compression fracture (HCC) 11/13/2022   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 04/24/2022   History of COVID-19 03/23/2022   Aortic insufficiency 06/08/2021   Ascending aortic aneurysm (HCC) 06/08/2021   Hypertension 06/07/2021   Conflict between patient and family 03/26/2021   Heart murmur 03/26/2021   Muscle cramping 03/26/2021   Right calf pain 03/22/2021   Excessive cerumen in right ear canal 01/14/2018   HPV in female 2019   Hematuria 07/31/2017   Asymmetric SNHL (sensorineural hearing loss) 11/29/2015   Subjective tinnitus of left ear 11/29/2015   Sensorineural hearing loss (SNHL), bilateral 11/29/2015   Cervical cancer screening 03/12/2015   Contact dermatitis 07/07/2013   Hyperlipidemia, mixed 03/19/2013   Hyperglycemia 03/19/2013   Osteopenia 03/19/2013   Preventative health care 09/08/2011   Osteoporosis 04/11/2010   FIBROIDS, UTERUS 03/07/2010   ELEVATED BLOOD PRESSURE WITHOUT DIAGNOSIS OF HYPERTENSION 03/07/2010   SKIN CANCER, HX OF 03/07/2010    PCP: Domenica Harlene LABOR, MD   REFERRING PROVIDER: Domenica Harlene LABOR, MD   REFERRING DIAG: R53.1 (ICD-10-CM) - Weakness  (Osteoporosis, falls and weakness)  THERAPY DIAG:  Muscle weakness (generalized)  Pain in thoracic spine  Pain of right upper arm  Stiffness of right shoulder, not elsewhere classified  Stiffness of left shoulder, not elsewhere classified  RATIONALE FOR EVALUATION AND TREATMENT: Rehabilitation  ONSET DATE: 10/2022 - T8 compression fracture; 08/2023 - R upper arm pain & L lumpectomy and axillary node dissection secondary to breast cancer  NEXT MD VISIT: 05/01/24   SUBJECTIVE:   SUBJECTIVE STATEMENT: Pt reports no issues with HEP other than questioning if she is performing wand chest pres correctly.  EVAL: Pt reports h/o T8 compression fracture ~1 yr ago (11/13/22) while participating in a gym based exercise program focusing on osteoporosis (she  describes standing in slight squat while lifting a resistance machine in front of her when she felt a pop in her mid back).  She was initially told it was a muscle strain and only later identified as a compression fracture on x-ray.  She did complete an episode of PT following this with some benefit noted but later when trying to 3 reduce the exercises at home started to notice limitation due to R upper arm pain.  She has been walking and doing some weightbearing exercises but has not progressed her strengthening and would like to do so.  In November she was diagnosed with breast cancer and underwent a lumpectomy and axillary node dissection on the left for which she was prescribed a HEP to prevent lymphedema.  She she continues to note some pain in her R upper arm over the past few months which limits her ability to reach with her R UE, perform her lymphedema exercises or sleep on her R side.  PAIN: Are you having pain? No and Yes: NPRS scale: 0/10  Pain location: mid back (~T8) Pain description: stiff, achy, uncomfortable Aggravating factors: cleaning (scrubbing the tub, vacuuming, esp when she does a lot in a day), otherwise unpredictable Relieving factors: heating pad, Tylenol   Are you having pain? No and Yes: NPRS scale: 0/10  Pain location: R lateral upper arm/shoulder Pain description: quick onset, pulling Aggravating factors: laying on R side, overhead shoulder ER (pec stretch), end range OH ROM Relieving factors: stopping triggering movement  PERTINENT HISTORY: T8 compression fracture 10/2022, osteoporosis, chronic B thoracic back pain, HTN, muscle cramping, L breast cancer, aortic insufficiency, ascending aortic aneurysm, B sensorineural hearing loss  PRECAUTIONS: Other: Osteoporosis with h/o thoracic compression fx  HAND DOMINANCE: Right  RED FLAGS: None  WEIGHT BEARING RESTRICTIONS: No  FALLS:  Has patient fallen in last 6 months? 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 To get rid of the arm pain and minimize the back pain. To stay active.   OBJECTIVE: (objective measures completed at initial evaluation unless otherwise dated)  DIAGNOSTIC FINDINGS:  06/20/23 - MR thoracic spine: IMPRESSION: 1. No acute osseous injury of the thoracic spine. 2. Chronic T8 vertebral body compression fracture with 50% height loss anteriorly.  04/18/23 - XR thoracic spine: T8 compression fracture with anterior height loss.   No significant change in height loss since films on 12/12/2022. No other  fracture seen.  No spondylolisthesis seen.  PATIENT SURVEYS:  ABC scale 1390 / 1600 = 86.9 %, > 80% indicates high level of physical functioning Modified Oswestry 15 / 50 = 30.0 %, indicates moderate disability  Quick Dash 31.8 / 100 = 31.8 %  COGNITION: Overall cognitive status: Within functional limits for tasks assessed     SENSATION: WFL  POSTURE:  rounded shoulders, forward head, and increased thoracic kyphosis  CERVICAL ROM:  Grossly WFL/WNL  UPPER EXTREMITY ROM:   Active ROM Right eval Left eval  Shoulder flexion 120 124  Shoulder extension 40 ^ 46  Shoulder abduction 142 ^ 138  Shoulder adduction    Shoulder internal rotation FIR L3 ^ FIR T12  Shoulder external rotation FER T3 ^ FER T3  Elbow flexion    Elbow extension    Wrist flexion    Wrist extension    Wrist ulnar deviation    Wrist radial deviation    Wrist pronation    Wrist supination    (Blank rows = not tested, ^ - increased  pain)  UPPER EXTREMITY MMT:  MMT Right eval Left eval  Shoulder flexion 4 4  Shoulder extension 4 4  Shoulder abduction 4 4  Shoulder adduction    Shoulder internal rotation 4+ 4+  Shoulder external rotation 4 4  Middle trapezius 4- 4-  Lower trapezius Unable Unable  Elbow flexion    Elbow extension    Wrist flexion    Wrist extension    Wrist ulnar deviation    Wrist radial deviation    Wrist pronation    Wrist supination    Grip strength (lbs)    (Blank rows = not tested)  MUSCLE LENGTH: Hamstrings: Mild tight B ITB: WFL Piriformis: Mild tight B Hip flexors: WFL Quads: Mild tight B  LOWER EXTREMITY ROM: Grossly WFL/WNL  LOWER EXTREMITY MMT:  MMT Right eval Left eval  Hip flexion 4- 4-  Hip extension 4+ 4+  Hip abduction 4- 4-  Hip adduction 4- 4-  Hip internal rotation 4+ 4+  Hip external rotation 3+ 3+  Knee flexion 5 5  Knee extension 5 5  Ankle dorsiflexion 5 5  Ankle plantarflexion    Ankle inversion    Ankle eversion     (Blank rows = not tested)  BED MOBILITY:  Independent  TRANSFERS: Assistive device utilized: None  Sit to stand: Complete Independence Stand to sit: Complete Independence Chair to chair: Complete Independence Floor:  NT  GAIT: Distance walked: Clinic distances Assistive device utilized: None Level of assistance: Complete Independence Gait pattern: WFL For  TODAY'S TREATMENT:  11/20/2023  THERAPEUTIC EXERCISE: to improve flexibility, strength and mobility.  Demonstration, verbal and tactile cues throughout for technique.  Rec bike - L2 x 6 min Hooklying B shoulder protraction with wand x 10 Hooklying B scap retraction + YTB horiz ABD x 10 Hooklying B scap retraction + YTB horiz ABD diagonals attempted but stopped d/t R upper arm pain Hooklying TrA + RTB bent knee fallout 10 x 3 Hooklying TrA + RTB hip flexion march 10 x 3 Hooklying TrA + hip ADD isometric pillow squeeze 10 x 3  THERAPEUTIC ACTIVITIES: To improve  functional performance while reducing risk for osteoporotic fracture.  Demonstration, verbal and tactile cues throughout for technique. Provided education in proper posture and body mechanics for typical daily positioning and household chores to minimize strain on low back.   11/14/2023  THERAPEUTIC EXERCISE: to improve flexibility, strength and mobility.  Demonstration, verbal and tactile cues throughout for technique.  Rec bike - L2 x 6 min Brief review of lymphedema HEP Thoracic extension over back of chair with arms folded on chest 10 x 5 Gentle low B pec stretch at doorway with shoulders at ~45-60 abduction & arms straight 2 x 30 Supine wand B shoulder ER (field goal position) 10 x 3 Supine wand B shoulder flexion 10 x 3 Seated YTB scap retraction + B shoulder ER x 10 - increased discomfort reported Seated YTB scap retraction + B shoulder horiz ABD x 10 - increased discomfort reported R & L unilateral shoulder flexion wall slides  SELF CARE:  Provided education on osteoporosis do's and don'ts with exercises and activity to reduce fracture risk.    11/08/2023  THERAPEUTIC EXERCISE: to improve flexibility, strength and mobility.  Demonstration, verbal and tactile cues throughout for technique.  Gentle low pec stretch at doorway with shoulders at ~45-60 abduction - attempted with B and unilateral approach - patient preferring B Standing YTB scapular retraction + B shoulder row to neutral (emphasis on scapular motion/activation) 10 x 5   PATIENT EDUCATION:  Education details: HEP review, HEP update, postural awareness, and posture and body mechanics for typical daily postioning, mobility and household tasks Person educated: Patient Education method: Explanation, Demonstration, Tactile cues, Verbal cues, and Handouts Education comprehension: verbalized understanding, returned demonstration, verbal cues required, tactile cues required, and needs further education  HOME EXERCISE  PROGRAM: Access Code: [email or web address] URL: [email or web address] Date: 11/20/2023 Prepared by: Elijah Hidden  Exercises - Doorway Pec Stretch at 60 Degrees Abduction with Arm Straight  - 2 x daily - 7 x weekly - 3 reps - 30 sec hold - Standing Bilateral Low Shoulder Row with Anchored Resistance  - 1 x daily - 7 x weekly - 2 sets - 10 reps - 5 sec hold - Seated Thoracic Lumbar Extension  - 1-2 x daily - 7 x weekly - 2 sets - 10 reps - 3 sec hold - Supine Shoulder External Rotation with Dowel  - 1-2 x daily - 7 x weekly - 2 sets - 10 reps - 3 sec hold - Supine Shoulder Flexion AAROM with Dowel  - 1-2 x daily - 7 x weekly - 2 sets - 10 reps - 3 sec hold - Supine Shoulder Protraction with Dowel  - 1-2 x daily - 7 x weekly - 2 sets - 10 reps - 3 sec hold - Shoulder Flexion Wall Slide with Towel  - 1-2 x daily - 7 x weekly - 2 sets - 10 reps - 3 sec hold - Supine Shoulder Horizontal Abduction with Resistance  - 1 x daily - 3-4 x weekly - 2 sets - 10 reps - 3 sec hold - Hooklying Single Leg Bent Knee Fallouts with Resistance  - 1 x daily - 3-4 x weekly - 2 sets - 10 reps - 3 sec hold - Supine March with Resistance Band  - 1 x daily - 3-4 x weekly - 2 sets - 10 reps - 2-3 sec hold hold - Supine Hip Adduction Isometric with Ball  - 2 x daily - 3-4 x weekly - 2 sets - 10 reps - 5 sec hold  Patient Education - Do it right & prevent fractures - Osteoporosis education - Posture and Body Mechanics   ASSESSMENT:  CLINICAL IMPRESSION: Zoeie was able to locate her L UE lymphedema HEP handout, therefore briefly reviewed the handout going over the few exercises not reviewed last visit and incorporated the relevant exercises into her current HEP.  Progressed strengthening, introducing core and proximal LE strengthening while reinforcing awareness of movements to avoid so as not to place too much compressive force on her spine.  Session conclude with education in proper posture and body mechanics for  typical daily positioning and household chores to minimize strain on low back while adhering to osteoporosis precautions.  Voula will benefit from skilled PT to address above deficits to improve mobility and activity tolerance while safely progressing strengthening with decreased pain interference.   OBJECTIVE IMPAIRMENTS: decreased activity tolerance, decreased knowledge of condition, difficulty walking, decreased ROM, decreased strength, decreased safety awareness, hypomobility, increased fascial restrictions, impaired perceived functional ability, increased muscle spasms, impaired flexibility, impaired UE functional use, improper body mechanics, postural dysfunction, and pain.   ACTIVITY LIMITATIONS: carrying, lifting, bending, sitting, standing, reach over head, locomotion level, and caring for others  PARTICIPATION LIMITATIONS: meal prep, cleaning, laundry, driving, shopping, community activity, and yard work  PERSONAL FACTORS: Past/current experiences, Time since onset of injury/illness/exacerbation, and 3+ comorbidities: T8 compression fracture 10/2022, osteoporosis, chronic B thoracic back pain, HTN, muscle cramping, L breast cancer, aortic insufficiency, ascending aortic aneurysm, B sensorineural hearing loss  are also affecting patient's functional outcome.   REHAB POTENTIAL: Excellent  CLINICAL DECISION MAKING: Evolving/moderate complexity  EVALUATION COMPLEXITY: Moderate   GOALS: Goals reviewed with patient? Yes  SHORT TERM GOALS: Target date: 12/06/2023   Patient will be independent with initial HEP. Baseline: HEP initiated on eval Goal status: IN PROGRESS  2.  Patient will report 25% improvement in mid back & R UE pain to improve QOL. Baseline: Thoracic back pain up to 5/10, R upper arm pain up to 4-5/10 Goal status: IN PROGRESS  LONG TERM GOALS: Target date: 01/03/2024  Patient will be independent with ongoing/advanced HEP for self-management at home.  Baseline:  Goal  status: IN PROGRESS  2.  Patient will report 50-75% improvement in low back and R UE pain to improve QOL.  Baseline:  Thoracic back pain up to 5/10, R upper arm pain up to 4-5/10 Goal status: IN PROGRESS  3.  Patient to demonstrate awareness of osteoporosis precautions as well as ability to achieve and maintain good spinal alignment/posturing and body mechanics needed for daily activities. Baseline:  Goal status: IN PROGRESS  4.  Patient will demonstrate functional pain free B shoulder ROM to perform ADLs.   Baseline: Refer to above UE ROM table Goal status: IN PROGRESS  5  Patient will demonstrate improved B shoulder and scapular strength to >/= 4+/5 for functional UE use Baseline: Refer to above UE MMT table Goal status: IN PROGRESS   6.  Patient will demonstrate improved B LE strength to >/= 4+/5 for improved stability and ease of mobility. Baseline: Refer to above LE MMT table Goal status: IN PROGRESS  7.  Patient will report </= 22% on QuickDASH (MCID = 10 pts) to demonstrate improved functional UE use with decreased pain interference. Baseline: 31.8 / 100 = 31.8 % Goal status: IN PROGRESS  8. Patient will report </= 18% on Modified Oswestry (MCID = 12%) to demonstrate improved functional ability with decreased pain interference. Baseline: 15 / 50 = 30.0 % Goal status: IN PROGRESS   PLAN:  PT FREQUENCY: 2x/week  PT DURATION: 8 weeks  PLANNED INTERVENTIONS: 97164- PT Re-evaluation, 97110-Therapeutic exercises, 97530- Therapeutic activity, [id number]- Neuromuscular re-education, 97535- Self Care, [id number]- Manual therapy, [id number]- Gait training, [id number]- Aquatic Therapy, 97014- Electrical stimulation (unattended), [id number]- Electrical stimulation (manual), [id number]- Vasopneumatic device, [id number]- Ultrasound, [id number]- Ionotophoresis 4mg /ml Dexamethasone, Patient/Family  education, Balance training, Stair training, Taping, Dry Needling, Joint mobilization, Spinal mobilization, Cryotherapy, and Moist  heat  PLAN FOR NEXT SESSION: Gently progress postural stretching and strengthening as well as core and proximal LE strengthening; review & update HEP PRN; MT +/- TPDN to address abnormal muscle tension restricting posture and R shoulder ROM; modalities as needed for pain management; review education on osteoporosis precautions as well as proper posture and body mechanics PRN   Elijah CHRISTELLA Hidden, PT 11/20/2023, 1:03 PM     Date of referral: 10/30/2023 Referring provider: Domenica Harlene LABOR, MD Referring diagnosis? R53.1 (ICD-10-CM) - Weakness  (Osteoporosis, falls and weakness) Treatment diagnosis? (if different than referring diagnosis)  Muscle weakness (generalized)  Pain in thoracic spine  Pain of right upper arm  Stiffness of right shoulder, not elsewhere classified  Stiffness of left shoulder, not elsewhere classified  What was this (referring dx) caused by? Ongoing Issue and Other: Weakness related to osteoporosis with T8 compression fracture  Lysle of Condition: Chronic (continuous duration > 3 months)   Laterality: Both  Current Functional Measure Score: Back Index 15 / 50 = 30.0 %  & QuickDASH 31.8 / 100 = 31.8 %  Objective measurements identify impairments when they are compared to normal values, the uninvolved extremity, and prior level of function.  [x]  Yes  []  No  Objective assessment of functional ability: Moderate functional limitations   Briefly describe symptoms: Thoracic back pain from history of T8 compression fracture 1 year ago.  She also notes recent onset of R upper arm pain originating around the time she underwent lumpectomy and axillary node dissection for breast cancer. Current deficits include pain as described above, postural abnormalities, core and postural weakness with limited proximal UE ROM and strength as well as proximal LE weakness.    How did symptoms start: Sudden onset for thoracic back pain at time of T8 compression fracture on 11/13/2022.   Gradual onset for R upper arm pain.  Weakness as a sequelae to both.  Average pain intensity:  Last 24 hours: 3/10  Past week: 4-5/10  How often does the pt experience symptoms? Frequently  How much have the symptoms interfered with usual daily activities? Moderately  How has condition changed since care began at this facility? NA - initial visit  In general, how is the patients overall health? Good  Onset date: 10/2022 - T8 compression fracture; 08/2023 - R upper arm pain & L lumpectomy and axillary node dissection secondary to breast cancer   BACK PAIN (STarT Back Screening Tool) - (When applicable):  Has your back pain spread down your leg(s) at sometime in the last 2 weeks? []  Yes   [x]  No Have you had pain in the shoulder or neck at sometime in the past 2 weeks? [x]  Yes   []  No Have you only walked short distances because of your back pain? [x]  Yes   []  No In the past 2 weeks, have you dressed more slowly than usual because of your back pain? []  Yes   [x]  No Do you think it is not really safe for person with a condition like yours to be physically active? []  Yes   [x]  No Have worrying thoughts been going through your mind a lot of the time? []  Yes   [x]  No Do you feel that your back pain is terrible and it is never going to get any better? []  Yes   [x]  No In general, have you stopped enjoying all the things you  usually enjoy? [x]  Yes   []  No Overall, how bothersome has your back pain been in the last 2 weeks? []  Not at all   []  Slightly     [x]  Moderate   []  Very much     []  Extremely

## 2023-11-22 ENCOUNTER — Ambulatory Visit: Payer: Medicare Other | Admitting: Physical Therapy

## 2023-11-22 ENCOUNTER — Encounter: Payer: Self-pay | Admitting: Physical Therapy

## 2023-11-22 ENCOUNTER — Encounter: Payer: Medicare Other | Admitting: Physical Therapy

## 2023-11-22 DIAGNOSIS — M25611 Stiffness of right shoulder, not elsewhere classified: Secondary | ICD-10-CM

## 2023-11-22 DIAGNOSIS — M79621 Pain in right upper arm: Secondary | ICD-10-CM

## 2023-11-22 DIAGNOSIS — M546 Pain in thoracic spine: Secondary | ICD-10-CM

## 2023-11-22 DIAGNOSIS — M25612 Stiffness of left shoulder, not elsewhere classified: Secondary | ICD-10-CM

## 2023-11-22 DIAGNOSIS — M6281 Muscle weakness (generalized): Secondary | ICD-10-CM

## 2023-11-22 NOTE — Therapy (Signed)
 OUTPATIENT PHYSICAL THERAPY TREATMENT   Patient Name: Yolanda Hernandez MRN: [id number] DOB:01-04-1956, 68 y.o., female Today's Date: 11/22/2023  END OF SESSION:  PT End of Session - 11/22/23 0934     Visit Number 4    Date for PT Re-Evaluation 01/03/24    Authorization Type [employer] Medicare    Authorization Time Period 11/08/23 - 01/03/24    Authorization - Visit Number 3    Authorization - Number of Visits 6    PT Start Time 0934    PT Stop Time 1014    PT Time Calculation (min) 40 min    Activity Tolerance Patient tolerated treatment well    Behavior During Therapy [facility] for tasks assessed/performed               Past Medical History:  Diagnosis Date   Cerumen impaction    chronic   Cervical cancer screening 03/12/2015   Menarche at 10 Regular and moderate flow  history of abnormal pap in past, bx and cryo once many years ago, normal since. Last 2011 and normal G0P0, s/p No history of abnormal MGM Last in 2015, no breast concerns  No concerns today Biopsy, cervical: gyn surgeries Menopause at 53 ish   Conflict between patient and family 03/26/2021   Dysmenorrhea    Heart murmur 03/26/2021   Hematuria 07/31/2017   HPV in female 2019   Hyperglycemia 03/19/2013   Hypertension    Muscle cramping 03/26/2021   Osteopenia    Osteoporosis 04/11/2010   Qualifier: Diagnosis of  By: Georgian ROSALEA CHARM Lamar    Other and unspecified hyperlipidemia 03/19/2013   Preventative health care 09/08/2011   Right calf pain 03/22/2021   Skin cancer    Skin cancer 07/14/2016   Uterine fibroid    Past Surgical History:  Procedure Laterality Date   BREAST SURGERY     biopsy left   COLPOSCOPY     GYNECOLOGIC CRYOSURGERY     MYRINGOPLASTY  1972   TONSILLECTOMY     Patient Active Problem List   Diagnosis Date Noted   Breast cancer, left (HCC) 10/30/2023   Weakness 03/20/2023   Closed wedge compression fracture of T8 vertebra (HCC) 03/20/2023   Chronic bilateral thoracic back pain 03/20/2023    Abnormal posture 03/20/2023   Thoracic compression fracture (HCC) 11/13/2022   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 04/24/2022   History of COVID-19 03/23/2022   Aortic insufficiency 06/08/2021   Ascending aortic aneurysm (HCC) 06/08/2021   Hypertension 06/07/2021   Conflict between patient and family 03/26/2021   Heart murmur 03/26/2021   Muscle cramping 03/26/2021   Right calf pain 03/22/2021   Excessive cerumen in right ear canal 01/14/2018   HPV in female 2019   Hematuria 07/31/2017   Asymmetric SNHL (sensorineural hearing loss) 11/29/2015   Subjective tinnitus of left ear 11/29/2015   Sensorineural hearing loss (SNHL), bilateral 11/29/2015   Cervical cancer screening 03/12/2015   Contact dermatitis 07/07/2013   Hyperlipidemia, mixed 03/19/2013   Hyperglycemia 03/19/2013   Osteopenia 03/19/2013   Preventative health care 09/08/2011   Osteoporosis 04/11/2010   FIBROIDS, UTERUS 03/07/2010   ELEVATED BLOOD PRESSURE WITHOUT DIAGNOSIS OF HYPERTENSION 03/07/2010   SKIN CANCER, HX OF 03/07/2010    PCP: Domenica Harlene LABOR, MD   REFERRING PROVIDER: Domenica Harlene LABOR, MD   REFERRING DIAG: R53.1 (ICD-10-CM) - Weakness  (Osteoporosis, falls and weakness)  THERAPY DIAG:  Muscle weakness (generalized)  Pain in thoracic spine  Pain of right upper arm  Stiffness of right shoulder, not elsewhere classified  Stiffness of left shoulder, not elsewhere classified  RATIONALE FOR EVALUATION AND TREATMENT: Rehabilitation  ONSET DATE: 10/2022 - T8 compression fracture; 08/2023 - R upper arm pain & L lumpectomy and axillary node dissection secondary to breast cancer  NEXT MD VISIT: 05/01/24   SUBJECTIVE:   SUBJECTIVE STATEMENT: Pt denies pain today. No questions regarding posture and body mechanics education provided last visit.  EVAL: Pt reports h/o T8 compression fracture ~1 yr ago (11/13/22) while participating in a gym based exercise program focusing on osteoporosis (she  describes standing in slight squat while lifting a resistance machine in front of her when she felt a pop in her mid back).  She was initially told it was a muscle strain and only later identified as a compression fracture on x-ray.  She did complete an episode of PT following this with some benefit noted but later when trying to 3 reduce the exercises at home started to notice limitation due to R upper arm pain.  She has been walking and doing some weightbearing exercises but has not progressed her strengthening and would like to do so.  In November she was diagnosed with breast cancer and underwent a lumpectomy and axillary node dissection on the left for which she was prescribed a HEP to prevent lymphedema.  She she continues to note some pain in her R upper arm over the past few months which limits her ability to reach with her R UE, perform her lymphedema exercises or sleep on her R side.  PAIN: Are you having pain? No and Yes: NPRS scale: 0/10  Pain location: mid back (~T8) Pain description: stiff, achy, uncomfortable Aggravating factors: cleaning (scrubbing the tub, vacuuming, esp when she does a lot in a day), otherwise unpredictable Relieving factors: heating pad, Tylenol   Are you having pain? No and Yes: NPRS scale: 0/10  Pain location: R lateral upper arm/shoulder Pain description: quick onset, pulling Aggravating factors: laying on R side, overhead shoulder ER (pec stretch), end range OH ROM Relieving factors: stopping triggering movement  PERTINENT HISTORY: T8 compression fracture 10/2022, osteoporosis, chronic B thoracic back pain, HTN, muscle cramping, L breast cancer, aortic insufficiency, ascending aortic aneurysm, B sensorineural hearing loss  PRECAUTIONS: Other: Osteoporosis with h/o thoracic compression fx  HAND DOMINANCE: Right  RED FLAGS: None  WEIGHT BEARING RESTRICTIONS: No  FALLS:  Has patient fallen in last 6 months? 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 To get rid of the arm pain and minimize the back pain. To stay active.   OBJECTIVE: (objective measures completed at initial evaluation unless otherwise dated)  DIAGNOSTIC FINDINGS:  06/20/23 - MR thoracic spine: IMPRESSION: 1. No acute osseous injury of the thoracic spine. 2. Chronic T8 vertebral body compression fracture with 50% height loss anteriorly.  04/18/23 - XR thoracic spine: T8 compression fracture with anterior height loss.   No significant change in height loss since films on 12/12/2022. No other  fracture seen.  No spondylolisthesis seen.   PATIENT  SURVEYS:  ABC scale 1390 / 1600 = 86.9 %, > 80% indicates high level of physical functioning Modified Oswestry 15 / 50 = 30.0 %, indicates moderate disability  Quick Dash 31.8 / 100 = 31.8 %  COGNITION: Overall cognitive status: Within functional limits for tasks assessed     SENSATION: WFL  POSTURE:  rounded shoulders, forward head, and increased thoracic kyphosis  CERVICAL ROM:  Grossly WFL/WNL  UPPER EXTREMITY ROM:   Active ROM Right eval Left eval  Shoulder flexion 120 124  Shoulder extension 40 ^ 46  Shoulder abduction 142 ^ 138  Shoulder adduction    Shoulder internal rotation FIR L3 ^ FIR T12  Shoulder external rotation FER T3 ^ FER T3  Elbow flexion    Elbow extension    Wrist flexion    Wrist extension    Wrist ulnar deviation    Wrist radial deviation    Wrist pronation    Wrist supination    (Blank rows = not tested, ^ - increased  pain)  UPPER EXTREMITY MMT:  MMT Right eval Left eval  Shoulder flexion 4 4  Shoulder extension 4 4  Shoulder abduction 4 4  Shoulder adduction    Shoulder internal rotation 4+ 4+  Shoulder external rotation 4 4  Middle trapezius 4- 4-  Lower trapezius Unable Unable  Elbow flexion    Elbow extension    Wrist flexion    Wrist extension    Wrist ulnar deviation    Wrist radial deviation    Wrist pronation    Wrist supination    Grip strength (lbs)    (Blank rows = not tested)  MUSCLE LENGTH: Hamstrings: Mild tight B ITB: WFL Piriformis: Mild tight B Hip flexors: WFL Quads: Mild tight B  LOWER EXTREMITY ROM: Grossly WFL/WNL  LOWER EXTREMITY MMT:  MMT Right eval Left eval  Hip flexion 4- 4-  Hip extension 4+ 4+  Hip abduction 4- 4-  Hip adduction 4- 4-  Hip internal rotation 4+ 4+  Hip external rotation 3+ 3+  Knee flexion 5 5  Knee extension 5 5  Ankle dorsiflexion 5 5  Ankle plantarflexion    Ankle inversion    Ankle eversion     (Blank rows = not tested)  BED MOBILITY:  Independent  TRANSFERS: Assistive device utilized: None  Sit to stand: Complete Independence Stand to sit: Complete Independence Chair to chair: Complete Independence Floor:  NT  GAIT: Distance walked: Clinic distances Assistive device utilized: None Level of assistance: Complete Independence Gait pattern: WFL For  TODAY'S TREATMENT:  11/22/2023 THERAPEUTIC EXERCISE: To improve strength, endurance, ROM, and flexibility.  Demonstration, verbal and tactile cues throughout for technique.  Rec bike - L3 x 6 min L shoulder distraction with PROM into flexion and IR/ER in scapular plane - improved flexion and ER ROM with decreased pain following DN Hooklying B scap retraction + YTB shoulder ER x 10 Hooklying B scap retraction + YTB horiz ABD diagonals x 10 Verbal clarification of movement patterns for core/LE strengthening added to HEP last visit  MANUAL THERAPY: To promote  normalized muscle tension, improved flexibility, improved joint mobility, increased ROM, and reduced pain. Trigger Point Dry Needling: Treatment instructions/education: Initial Treatment: Pt instructed on Dry Needling rational, procedures, and possible side effects. Pt instructed to expect mild to moderate muscle soreness later in the day and/or into the next day.  Pt instructed in methods to reduce muscle soreness. Pt instructed to continue prescribed HEP. Patient was educated on  signs and symptoms of infection and other risk factors and advised to seek medical attention should they occur.  Patient verbalized understanding of these instructions and education.  Education Handout Provided: Yes Consent: Patient Verbal Consent Given: Yes Treatment: Muscles Treated: R anterior deltoid Skilled palpation and monitoring of soft tissue during DN Electrical Stimulation Performed: No Treatment Response/Outcome: Twitch Response Elicited, Palpable Increase in Muscle Length, Decreased TTP, and Improved Exercise Tolerance STM/DTM, manual TPR and pin & stretch to muscles addressed with DN R shoulder gentle distraction and oscillation for muscle relaxation  R shoulder inferior and A/P mobs - grade II-III   11/20/2023  THERAPEUTIC EXERCISE: to improve flexibility, strength and mobility.  Demonstration, verbal and tactile cues throughout for technique.  Rec bike - L2 x 6 min Hooklying B shoulder protraction with wand x 10 Hooklying B scap retraction + YTB horiz ABD x 10 Hooklying B scap retraction + YTB horiz ABD diagonals attempted but stopped d/t R upper arm pain Hooklying TrA + RTB bent knee fallout 10 x 3 Hooklying TrA + RTB hip flexion march 10 x 3 Hooklying TrA + hip ADD isometric pillow squeeze 10 x 3  THERAPEUTIC ACTIVITIES: To improve functional performance while reducing risk for osteoporotic fracture.  Demonstration, verbal and tactile cues throughout for technique. Provided education in  proper posture and body mechanics for typical daily positioning and household chores to minimize strain on low back.   11/14/2023  THERAPEUTIC EXERCISE: to improve flexibility, strength and mobility.  Demonstration, verbal and tactile cues throughout for technique.  Rec bike - L2 x 6 min Brief review of lymphedema HEP Thoracic extension over back of chair with arms folded on chest 10 x 5 Gentle low B pec stretch at doorway with shoulders at ~45-60 abduction & arms straight 2 x 30 Supine wand B shoulder ER (field goal position) 10 x 3 Supine wand B shoulder flexion 10 x 3 Seated YTB scap retraction + B shoulder ER x 10 - increased discomfort reported Seated YTB scap retraction + B shoulder horiz ABD x 10 - increased discomfort reported R & L unilateral shoulder flexion wall slides  SELF CARE:  Provided education on osteoporosis do's and don'ts with exercises and activity to reduce fracture risk.    PATIENT EDUCATION:  Education details: HEP review, role of DN, and DN rational, procedure, outcomes, potential side effects, and recommended post-treatment exercises/activity Person educated: Patient Education method: Explanation, Demonstration, Tactile cues, Verbal cues, and Handouts Education comprehension: verbalized understanding, returned demonstration, verbal cues required, tactile cues required, and needs further education  HOME EXERCISE PROGRAM: Access Code: [email or web address] URL: [email or web address] Date: 11/22/2023 Prepared by: Elijah Hidden  Exercises - Doorway Pec Stretch at 60 Degrees Abduction with Arm Straight  - 2 x daily - 7 x weekly - 3 reps - 30 sec hold - Standing Bilateral Low Shoulder Row with Anchored Resistance  - 1 x daily - 7 x weekly - 2 sets - 10 reps - 5 sec hold - Seated Thoracic Lumbar Extension  - 1-2 x daily - 7 x weekly - 2 sets - 10 reps - 3 sec hold - Supine Shoulder External Rotation with Dowel  - 1-2 x daily - 7 x weekly - 2 sets - 10 reps  - 3 sec hold - Supine Shoulder Flexion AAROM with Dowel  - 1-2 x daily - 7 x weekly - 2 sets - 10 reps - 3 sec hold - Supine Shoulder Protraction with Dowel  - 1-2 x daily - 7  x weekly - 2 sets - 10 reps - 3 sec hold - Shoulder Flexion Wall Slide with Towel  - 1-2 x daily - 7 x weekly - 2 sets - 10 reps - 3 sec hold - Supine Shoulder Horizontal Abduction with Resistance  - 1 x daily - 3-4 x weekly - 2 sets - 10 reps - 3 sec hold - Hooklying Single Leg Bent Knee Fallouts with Resistance  - 1 x daily - 3-4 x weekly - 2 sets - 10 reps - 3 sec hold - Supine March with Resistance Band  - 1 x daily - 3-4 x weekly - 2 sets - 10 reps - 2-3 sec hold hold - Supine Hip Adduction Isometric with Ball  - 2 x daily - 3-4 x weekly - 2 sets - 10 reps - 5 sec hold  Patient Education - Do it right & prevent fractures - Osteoporosis education - Posture and Body Mechanics - Trigger Point Dry Needling   ASSESSMENT:  CLINICAL IMPRESSION: Geisha reports good understanding of posture and body mechanics education provided last visit, and denies need for further review (LTG #3 met).  She reports ~15% improvement in R shoulder/upper arm pain since start of PT, however minimal to no change in thoracic level pain - pain continues to typically increase as the day progresses.  Taut bands identified in R anterior shoulder/upper arm which appeared amenable to TPDN.  After explanation of DN rational, procedures, outcomes and potential side effects, patient verbalized consent to DN treatment in conjunction with manual STM/DTM and TPR to reduce ttp/muscle tension.  Muscles treated as indicated above.  DN produced normal response with good twitches elicited resulting in palpable reduction in pain/ttp and muscle tension, with patient now able to perform exercises that previously triggered increased pain without pain today.  Pt educated to expect mild to moderate muscle soreness for up to 24-48 hrs and instructed to continue prescribed  HEP and current activity level with pt verbalizing understanding of these instructions.  Margarethe will benefit from skilled PT to address above deficits to improve mobility and activity tolerance while safely progressing strengthening with decreased pain interference.   OBJECTIVE IMPAIRMENTS: decreased activity tolerance, decreased knowledge of condition, difficulty walking, decreased ROM, decreased strength, decreased safety awareness, hypomobility, increased fascial restrictions, impaired perceived functional ability, increased muscle spasms, impaired flexibility, impaired UE functional use, improper body mechanics, postural dysfunction, and pain.   ACTIVITY LIMITATIONS: carrying, lifting, bending, sitting, standing, reach over head, locomotion level, and caring for others  PARTICIPATION LIMITATIONS: meal prep, cleaning, laundry, driving, shopping, community activity, and yard work  PERSONAL FACTORS: Past/current experiences, Time since onset of injury/illness/exacerbation, and 3+ comorbidities: T8 compression fracture 10/2022, osteoporosis, chronic B thoracic back pain, HTN, muscle cramping, L breast cancer, aortic insufficiency, ascending aortic aneurysm, B sensorineural hearing loss  are also affecting patient's functional outcome.   REHAB POTENTIAL: Excellent  CLINICAL DECISION MAKING: Evolving/moderate complexity  EVALUATION COMPLEXITY: Moderate   GOALS: Goals reviewed with patient? Yes  SHORT TERM GOALS: Target date: 12/06/2023   Patient will be independent with initial HEP. Baseline: HEP initiated on eval Goal status: IN PROGRESS - 11/22/23 - verbal clarification provided for recent HEP addition  2.  Patient will report 25% improvement in mid back & R UE pain to improve QOL. Baseline: Thoracic back pain up to 5/10, R upper arm pain up to 4-5/10 Goal status: IN PROGRESS - 11/22/23 - pain still unpredictable - 15% improvement in R upper arm pain, minimal change in  thoracic pain  LONG TERM  GOALS: Target date: 01/03/2024  Patient will be independent with ongoing/advanced HEP for self-management at home.  Baseline:  Goal status: IN PROGRESS  2.  Patient will report 50-75% improvement in low back and R UE pain to improve QOL.  Baseline:  Thoracic back pain up to 5/10, R upper arm pain up to 4-5/10 Goal status: IN PROGRESS  3.  Patient to demonstrate awareness of osteoporosis precautions as well as ability to achieve and maintain good spinal alignment/posturing and body mechanics needed for daily activities. Baseline:  Goal status: MET - 11/22/23  4.  Patient will demonstrate functional pain free B shoulder ROM to perform ADLs.   Baseline: Refer to above UE ROM table Goal status: IN PROGRESS  5  Patient will demonstrate improved B shoulder and scapular strength to >/= 4+/5 for functional UE use Baseline: Refer to above UE MMT table Goal status: IN PROGRESS   6.  Patient will demonstrate improved B LE strength to >/= 4+/5 for improved stability and ease of mobility. Baseline: Refer to above LE MMT table Goal status: IN PROGRESS  7.  Patient will report </= 22% on QuickDASH (MCID = 10 pts) to demonstrate improved functional UE use with decreased pain interference. Baseline: 31.8 / 100 = 31.8 % Goal status: IN PROGRESS  8. Patient will report </= 18% on Modified Oswestry (MCID = 12%) to demonstrate improved functional ability with decreased pain interference. Baseline: 15 / 50 = 30.0 % Goal status: IN PROGRESS   PLAN:  PT FREQUENCY: 2x/week  PT DURATION: 8 weeks  PLANNED INTERVENTIONS: 97164- PT Re-evaluation, 97110-Therapeutic exercises, 97530- Therapeutic activity, [id number]- Neuromuscular re-education, 97535- Self Care, [id number]- Manual therapy, [id number]- Gait training, [phone]- Aquatic Therapy, 97014- Electrical stimulation (unattended), [phone]- Electrical stimulation (manual), 97016- Vasopneumatic device, [id number]- Ultrasound, 02966- Ionotophoresis 4mg /ml Dexamethasone,  Patient/Family education, Balance training, Stair training, Taping, Dry Needling, Joint mobilization, Spinal mobilization, Cryotherapy, and Moist heat  PLAN FOR NEXT SESSION: Gently progress postural stretching and strengthening as well as core and proximal LE strengthening; review & update HEP PRN; MT +/- TPDN to address abnormal muscle tension restricting posture and R shoulder ROM; modalities as needed for pain management; review education on osteoporosis precautions as well as proper posture and body mechanics PRN   Elijah CHRISTELLA Hidden, PT 11/22/2023, 10:28 AM     Date of referral: 10/30/2023 Referring provider: Domenica Harlene LABOR, MD Referring diagnosis? R53.1 (ICD-10-CM) - Weakness  (Osteoporosis, falls and weakness) Treatment diagnosis? (if different than referring diagnosis)  Muscle weakness (generalized)  Pain in thoracic spine  Pain of right upper arm  Stiffness of right shoulder, not elsewhere classified  Stiffness of left shoulder, not elsewhere classified  What was this (referring dx) caused by? Ongoing Issue and Other: Weakness related to osteoporosis with T8 compression fracture  Lysle of Condition: Chronic (continuous duration > 3 months)   Laterality: Both  Current Functional Measure Score: Back Index 15 / 50 = 30.0 %  & QuickDASH 31.8 / 100 = 31.8 %  Objective measurements identify impairments when they are compared to normal values, the uninvolved extremity, and prior level of function.  [x]  Yes  []  No  Objective assessment of functional ability: Moderate functional limitations   Briefly describe symptoms: Thoracic back pain from history of T8 compression fracture 1 year ago.  She also notes recent onset of R upper arm pain originating around the time she underwent lumpectomy and axillary node dissection for breast cancer. Current deficits include pain  as described above, postural abnormalities, core and postural weakness with limited proximal UE ROM and strength as  well as proximal LE weakness.    How did symptoms start: Sudden onset for thoracic back pain at time of T8 compression fracture on 11/13/2022.  Gradual onset for R upper arm pain.  Weakness as a sequelae to both.  Average pain intensity:  Last 24 hours: 3/10  Past week: 4-5/10  How often does the pt experience symptoms? Frequently  How much have the symptoms interfered with usual daily activities? Moderately  How has condition changed since care began at this facility? NA - initial visit  In general, how is the patients overall health? Good  Onset date: 10/2022 - T8 compression fracture; 08/2023 - R upper arm pain & L lumpectomy and axillary node dissection secondary to breast cancer   BACK PAIN (STarT Back Screening Tool) - (When applicable):  Has your back pain spread down your leg(s) at sometime in the last 2 weeks? []  Yes   [x]  No Have you had pain in the shoulder or neck at sometime in the past 2 weeks? [x]  Yes   []  No Have you only walked short distances because of your back pain? [x]  Yes   []  No In the past 2 weeks, have you dressed more slowly than usual because of your back pain? []  Yes   [x]  No Do you think it is not really safe for person with a condition like yours to be physically active? []  Yes   [x]  No Have worrying thoughts been going through your mind a lot of the time? []  Yes   [x]  No Do you feel that your back pain is terrible and it is never going to get any better? []  Yes   [x]  No In general, have you stopped enjoying all the things you usually enjoy? [x]  Yes   []  No Overall, how bothersome has your back pain been in the last 2 weeks? []  Not at all   []  Slightly     [x]  Moderate   []  Very much     []  Extremely

## 2023-11-22 NOTE — Patient Instructions (Signed)

## 2023-11-27 ENCOUNTER — Ambulatory Visit: Payer: Medicare Other | Admitting: Physical Therapy

## 2023-11-27 ENCOUNTER — Encounter: Payer: Self-pay | Admitting: Physical Therapy

## 2023-11-27 DIAGNOSIS — M25611 Stiffness of right shoulder, not elsewhere classified: Secondary | ICD-10-CM

## 2023-11-27 DIAGNOSIS — M546 Pain in thoracic spine: Secondary | ICD-10-CM

## 2023-11-27 DIAGNOSIS — M6281 Muscle weakness (generalized): Secondary | ICD-10-CM | POA: Diagnosis not present

## 2023-11-27 DIAGNOSIS — M25612 Stiffness of left shoulder, not elsewhere classified: Secondary | ICD-10-CM

## 2023-11-27 DIAGNOSIS — M79621 Pain in right upper arm: Secondary | ICD-10-CM

## 2023-11-27 NOTE — Therapy (Signed)
OUTPATIENT PHYSICAL THERAPY TREATMENT   Patient Name: Yolanda Hernandez MRN: [id number] DOB:1956-06-20, 68 y.o., female Today's Date: 11/27/2023  END OF SESSION:  PT End of Session - 11/27/23 1145     Visit Number 5    Date for PT Re-Evaluation 01/03/24    Authorization Type [employer] Medicare    Authorization Time Period 11/08/23 - 01/03/24    Authorization - Visit Number 4    Authorization - Number of Visits 6    PT Start Time 1145    PT Stop Time 1232    PT Time Calculation (min) 47 min    Activity Tolerance Patient tolerated treatment well    Behavior During Therapy [facility] for tasks assessed/performed                Past Medical History:  Diagnosis Date   Cerumen impaction    chronic   Cervical cancer screening 03/12/2015   Menarche at 10 Regular and moderate flow  history of abnormal pap in past, bx and cryo once many years ago, normal since. Last 2011 and normal G0P0, s/p No history of abnormal MGM Last in 2015, no breast concerns  No concerns today Biopsy, cervical: gyn surgeries Menopause at 53 ish   Conflict between patient and family 03/26/2021   Dysmenorrhea    Heart murmur 03/26/2021   Hematuria 07/31/2017   HPV in female 2019   Hyperglycemia 03/19/2013   Hypertension    Muscle cramping 03/26/2021   Osteopenia    Osteoporosis 04/11/2010   Qualifier: Diagnosis of  By: Nena Jordan    Other and unspecified hyperlipidemia 03/19/2013   Preventative health care 09/08/2011   Right calf pain 03/22/2021   Skin cancer    Skin cancer 07/14/2016   Uterine fibroid    Past Surgical History:  Procedure Laterality Date   BREAST SURGERY     biopsy left   COLPOSCOPY     GYNECOLOGIC CRYOSURGERY     MYRINGOPLASTY  1972   TONSILLECTOMY     Patient Active Problem List   Diagnosis Date Noted   Breast cancer, left (HCC) 10/30/2023   Weakness 03/20/2023   Closed wedge compression fracture of T8 vertebra (HCC) 03/20/2023   Chronic bilateral thoracic back pain  03/20/2023   Abnormal posture 03/20/2023   Thoracic compression fracture (HCC) 11/13/2022   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 04/24/2022   History of COVID-19 03/23/2022   Aortic insufficiency 06/08/2021   Ascending aortic aneurysm (HCC) 06/08/2021   Hypertension 06/07/2021   Conflict between patient and family 03/26/2021   Heart murmur 03/26/2021   Muscle cramping 03/26/2021   Right calf pain 03/22/2021   Excessive cerumen in right ear canal 01/14/2018   HPV in female 2019   Hematuria 07/31/2017   Asymmetric SNHL (sensorineural hearing loss) 11/29/2015   Subjective tinnitus of left ear 11/29/2015   Sensorineural hearing loss (SNHL), bilateral 11/29/2015   Cervical cancer screening 03/12/2015   Contact dermatitis 07/07/2013   Hyperlipidemia, mixed 03/19/2013   Hyperglycemia 03/19/2013   Osteopenia 03/19/2013   Preventative health care 09/08/2011   Osteoporosis 04/11/2010   FIBROIDS, UTERUS 03/07/2010   ELEVATED BLOOD PRESSURE WITHOUT DIAGNOSIS OF HYPERTENSION 03/07/2010   SKIN CANCER, HX OF 03/07/2010    PCP: Bradd Canary, MD   REFERRING PROVIDER: Bradd Canary, MD   REFERRING DIAG: R53.1 (ICD-10-CM) - Weakness  (Osteoporosis, falls and weakness)  THERAPY DIAG:  Muscle weakness (generalized)  Pain in thoracic spine  Pain of right upper  arm  Stiffness of right shoulder, not elsewhere classified  Stiffness of left shoulder, not elsewhere classified  RATIONALE FOR EVALUATION AND TREATMENT: Rehabilitation  ONSET DATE: 10/2022 - T8 compression fracture; 08/2023 - R upper arm pain & L lumpectomy and axillary node dissection secondary to breast cancer  NEXT MD VISIT: 05/01/24   SUBJECTIVE:   SUBJECTIVE STATEMENT: Pt reports she feels like she can move her arm more w/o pain since the DN last visit but will still get an occasional pain with certain movements.  No pain currently.  EVAL: Pt reports h/o T8 compression fracture ~1 yr ago (11/13/22) while  participating in a gym based exercise program focusing on osteoporosis (she describes standing in slight squat while lifting a resistance machine in front of her when she felt a pop in her mid back).  She was initially told it was a muscle strain and only later identified as a compression fracture on x-ray.  She did complete an episode of PT following this with some benefit noted but later when trying to 3 reduce the exercises at home started to notice limitation due to R upper arm pain.  She has been walking and doing some weightbearing exercises but has not progressed her strengthening and would like to do so.  In November she was diagnosed with breast cancer and underwent a lumpectomy and axillary node dissection on the left for which she was prescribed a HEP to prevent lymphedema.  She she continues to note some pain in her R upper arm over the past few months which limits her ability to reach with her R UE, perform her lymphedema exercises or sleep on her R side.  PAIN: Are you having pain? No and Yes: NPRS scale: 0/10  Pain location: mid back (~T8) Pain description: stiff, achy, uncomfortable Aggravating factors: cleaning (scrubbing the tub, vacuuming, esp when she does a lot in a day), otherwise unpredictable Relieving factors: heating pad, Tylenol  Are you having pain? No and Yes: NPRS scale: 0/10  Pain location: R lateral upper arm/shoulder Pain description: quick onset, pulling Aggravating factors: laying on R side, overhead shoulder ER (pec stretch), end range OH ROM Relieving factors: stopping triggering movement  PERTINENT HISTORY: T8 compression fracture 10/2022, osteoporosis, chronic B thoracic back pain, HTN, muscle cramping, L breast cancer, aortic insufficiency, ascending aortic aneurysm, B sensorineural hearing loss  PRECAUTIONS: Other: Osteoporosis with h/o thoracic compression fx  HAND DOMINANCE: Right  RED FLAGS: None  WEIGHT BEARING RESTRICTIONS: No  FALLS:  Has  patient fallen in last 6 months? 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 To get rid of the arm pain and minimize the back pain. To stay active."   OBJECTIVE: (objective measures completed at initial evaluation unless otherwise dated)  DIAGNOSTIC FINDINGS:  06/20/23 - MR thoracic spine: IMPRESSION: 1. No acute osseous injury of the thoracic spine. 2. Chronic T8 vertebral body compression fracture with 50% height loss anteriorly.  04/18/23 - XR thoracic spine: T8 compression fracture with anterior height loss.   No significant change  in height loss since films on 12/12/2022. No other  fracture seen.  No spondylolisthesis seen.   PATIENT SURVEYS:  ABC scale 1390 / 1600 = 86.9 %, > 80% indicates high level of physical functioning Modified Oswestry 15 / 50 = 30.0 %, indicates moderate disability  Quick Dash 31.8 / 100 = 31.8 %  COGNITION: Overall cognitive status: Within functional limits for tasks assessed     SENSATION: WFL  POSTURE:  rounded shoulders, forward head, and increased thoracic kyphosis  CERVICAL ROM:  Grossly WFL/WNL  UPPER EXTREMITY ROM:   Active ROM Right eval Left eval R 11/27/23 L 11/27/23  Shoulder flexion 120 124 128 129  Shoulder extension 40 ^ 46 50 50  Shoulder abduction 142 ^ 138 114 ^ 136  Shoulder adduction      Shoulder internal rotation FIR L3 ^ FIR T12 FIR L1 FIR T12  Shoulder external rotation FER T3 ^ FER T3 FER T5 FER T5  (Blank rows = not tested, ^ - increased pain)  UPPER EXTREMITY  MMT:  MMT Right eval Left eval  Shoulder flexion 4 4  Shoulder extension 4 4  Shoulder abduction 4 4  Shoulder adduction    Shoulder internal rotation 4+ 4+  Shoulder external rotation 4 4  Middle trapezius 4- 4-  Lower trapezius Unable Unable  Elbow flexion    Elbow extension    Wrist flexion    Wrist extension    Wrist ulnar deviation    Wrist radial deviation    Wrist pronation    Wrist supination    Grip strength (lbs)    (Blank rows = not tested)  MUSCLE LENGTH: Hamstrings: Mild tight B ITB: WFL Piriformis: Mild tight B Hip flexors: WFL Quads: Mild tight B  LOWER EXTREMITY ROM: Grossly WFL/WNL  LOWER EXTREMITY MMT:  MMT Right eval Left eval  Hip flexion 4- 4-  Hip extension 4+ 4+  Hip abduction 4- 4-  Hip adduction 4- 4-  Hip internal rotation 4+ 4+  Hip external rotation 3+ 3+  Knee flexion 5 5  Knee extension 5 5  Ankle dorsiflexion 5 5  Ankle plantarflexion    Ankle inversion    Ankle eversion     (Blank rows = not tested)  BED MOBILITY:  Independent  TRANSFERS: Assistive device utilized: None  Sit to stand: Complete Independence Stand to sit: Complete Independence Chair to chair: Complete Independence Floor:  NT  GAIT: Distance walked: Clinic distances Assistive device utilized: None Level of assistance: Complete Independence Gait pattern: WFL For  TODAY'S TREATMENT:  11/27/2023  THERAPEUTIC EXERCISE: To improve strength, endurance, ROM, and flexibility.  Demonstration, verbal and tactile cues throughout for technique.  Rec bike - L4 x 6 min Hooklying TrA + GTB hip flexion march 10 x 3" Hooklying TrA + GTB bent knee fallout 10 x 3" Hooklying B scap retraction + RTB horiz ABD x 10  NEUROMUSCULAR RE-EDUCATION: To improve kinesthetic sense and posture.  Seated scapular retraction into pool noodle along spine in back of chair + B shoulder ER x 10 Seated scapular retraction into pool noodle along spine in back of chair + YTB B shoulder  ER x 10 Seated scapular retraction into pool noodle along spine in back of chair + YTB B low angle shoulder horiz ABD x 10 Standing TrA + YTB scapular retraction + B shoulder row x 10 Standing TrA + YTB scapular retraction + B shoulder extension x 10 Standing TrA + YTB scapular retraction + B shoulder  extension & long-arm ER x 10  THERAPEUTIC ACTIVITIES: To improve functional performance.  Demonstration, verbal and tactile cues throughout for technique. Worked on floor to stand transfers via log roll to sidelying to kneeling into stand to promote neutral spine    11/22/2023 THERAPEUTIC EXERCISE: To improve strength, endurance, ROM, and flexibility.  Demonstration, verbal and tactile cues throughout for technique.  Rec bike - L3 x 6 min L shoulder distraction with PROM into flexion and IR/ER in scapular plane - improved flexion and ER ROM with decreased pain following DN Hooklying B scap retraction + YTB shoulder ER x 10 Hooklying B scap retraction + YTB horiz ABD diagonals x 10 Verbal clarification of movement patterns for core/LE strengthening added to HEP last visit  MANUAL THERAPY: To promote normalized muscle tension, improved flexibility, improved joint mobility, increased ROM, and reduced pain. Trigger Point Dry Needling: Treatment instructions/education: Initial Treatment: Pt instructed on Dry Needling rational, procedures, and possible side effects. Pt instructed to expect mild to moderate muscle soreness later in the day and/or into the next day.  Pt instructed in methods to reduce muscle soreness. Pt instructed to continue prescribed HEP. Patient was educated on signs and symptoms of infection and other risk factors and advised to seek medical attention should they occur.  Patient verbalized understanding of these instructions and education.  Education Handout Provided: Yes Consent: Patient Verbal Consent Given: Yes Treatment: Muscles Treated: R anterior deltoid Skilled  palpation and monitoring of soft tissue during DN Electrical Stimulation Performed: No Treatment Response/Outcome: Twitch Response Elicited, Palpable Increase in Muscle Length, Decreased TTP, and Improved Exercise Tolerance STM/DTM, manual TPR and pin & stretch to muscles addressed with DN R shoulder gentle distraction and oscillation for muscle relaxation  R shoulder inferior and A/P mobs - grade II-III   11/20/2023  THERAPEUTIC EXERCISE: to improve flexibility, strength and mobility.  Demonstration, verbal and tactile cues throughout for technique.  Rec bike - L2 x 6 min Hooklying B shoulder protraction with wand x 10 Hooklying B scap retraction + YTB horiz ABD x 10 Hooklying B scap retraction + YTB horiz ABD diagonals attempted but stopped d/t R upper arm pain Hooklying TrA + RTB bent knee fallout 10 x 3" Hooklying TrA + RTB hip flexion march 10 x 3" Hooklying TrA + hip ADD isometric pillow squeeze 10 x 3"  THERAPEUTIC ACTIVITIES: To improve functional performance while reducing risk for osteoporotic fracture.  Demonstration, verbal and tactile cues throughout for technique. Provided education in proper posture and body mechanics for typical daily positioning and household chores to minimize strain on low back.   PATIENT EDUCATION:  Education details: HEP progression, role of DN, and DN rational, procedure, outcomes, potential side effects, and recommended post-treatment exercises/activity Person educated: Patient Education method: Explanation, Demonstration, Tactile cues, Verbal cues, and Handouts Education comprehension: verbalized understanding, returned demonstration, verbal cues required, tactile cues required, and needs further education  HOME EXERCISE PROGRAM: Access Code: [email or web address] URL: [email or web address] Date: 11/27/2023 Prepared by: Glenetta Hew  Exercises - Doorway Pec Stretch at 60 Degrees Abduction with Arm Straight  - 2 x daily - 7 x weekly - 3 reps -  30 sec hold - Seated Thoracic Lumbar Extension  - 1-2 x daily - 7 x weekly - 2 sets - 10 reps - 3 sec hold - Supine Shoulder External Rotation with Dowel  - 1-2 x daily - 7 x weekly - 2 sets - 10 reps - 3 sec hold - Supine Shoulder Flexion AAROM with Dowel  -  1-2 x daily - 7 x weekly - 2 sets - 10 reps - 3 sec hold - Supine Shoulder Protraction with Dowel  - 1-2 x daily - 7 x weekly - 2 sets - 10 reps - 3 sec hold - Shoulder Flexion Wall Slide with Towel  - 1-2 x daily - 7 x weekly - 2 sets - 10 reps - 3 sec hold - Supine Shoulder Horizontal Abduction with Resistance  - 1 x daily - 3-4 x weekly - 2 sets - 10 reps - 3 sec hold - Hooklying Single Leg Bent Knee Fallouts with Resistance  - 1 x daily - 3-4 x weekly - 2 sets - 10 reps - 3 sec hold - Supine March with Resistance Band  - 1 x daily - 3-4 x weekly - 2 sets - 10 reps - 2-3 sec hold hold - Supine Hip Adduction Isometric with Ball  - 2 x daily - 3-4 x weekly - 2 sets - 10 reps - 5 sec hold - Seated Bilateral Shoulder External Rotation with Resistance  - 1 x daily - 3-4 x weekly - 2 sets - 10 reps - 3 sec hold - Standing Bilateral Low Shoulder Row with Anchored Resistance  - 1 x daily - 3-4 x weekly - 2 sets - 10 reps - 5 sec hold - Scapular Retraction with Resistance Advanced  - 1 x daily - 3-4 x weekly - 2 sets - 10 reps - 5 sec hold - Low Trap Setting at Wall  - 1 x daily - 3-4 x weekly - 2 sets - 10 reps - 3 sec hold  Patient Education - Do it right & prevent fractures - Osteoporosis education - Posture and Body Mechanics - Trigger Point Dry Needling   ASSESSMENT:  CLINICAL IMPRESSION: Kristia reports decreased pain with R shoulder ROM in most planes following DN last session although still more limited with increased pain into L shoulder ABD.  Overall B shoulder ROM improving per goniometric measurement today, with only exception being R shoulder ABD.  Progressed postural strengthening with increasing gravity influence today, utilizing  pool noodle along spine in chair to facilitate more upright posture and scapular engagement in sitting.  Also progressed standing scapular and core strengthening with cues necessary for abdominal muscle engagement to avoid compensatory trunk extension along with cues to avoid shoulder shrugs.  Able to increase theraband resistance levels with ongoing supine/hooklying exercises for HEP.   Leith will benefit from skilled PT to address above deficits to improve mobility and activity tolerance while safely progressing strengthening with decreased pain interference.   OBJECTIVE IMPAIRMENTS: decreased activity tolerance, decreased knowledge of condition, difficulty walking, decreased ROM, decreased strength, decreased safety awareness, hypomobility, increased fascial restrictions, impaired perceived functional ability, increased muscle spasms, impaired flexibility, impaired UE functional use, improper body mechanics, postural dysfunction, and pain.   ACTIVITY LIMITATIONS: carrying, lifting, bending, sitting, standing, reach over head, locomotion level, and caring for others  PARTICIPATION LIMITATIONS: meal prep, cleaning, laundry, driving, shopping, community activity, and yard work  PERSONAL FACTORS: Past/current experiences, Time since onset of injury/illness/exacerbation, and 3+ comorbidities: T8 compression fracture 10/2022, osteoporosis, chronic B thoracic back pain, HTN, muscle cramping, L breast cancer, aortic insufficiency, ascending aortic aneurysm, B sensorineural hearing loss  are also affecting patient's functional outcome.   REHAB POTENTIAL: Excellent  CLINICAL DECISION MAKING: Evolving/moderate complexity  EVALUATION COMPLEXITY: Moderate   GOALS: Goals reviewed with patient? Yes  SHORT TERM GOALS: Target date: 12/06/2023   Patient will be  independent with initial HEP. Baseline: HEP initiated on eval Goal status: MET - 11/27/23   2.  Patient will report 25% improvement in mid back & R  UE pain to improve QOL. Baseline: Thoracic back pain up to 5/10, R upper arm pain up to 4-5/10 Goal status: IN PROGRESS - 11/22/23 - pain still unpredictable - 15% improvement in R upper arm pain, minimal change in thoracic pain  LONG TERM GOALS: Target date: 01/03/2024  Patient will be independent with ongoing/advanced HEP for self-management at home.  Baseline:  Goal status: IN PROGRESS  2.  Patient will report 50-75% improvement in low back and R UE pain to improve QOL.  Baseline:  Thoracic back pain up to 5/10, R upper arm pain up to 4-5/10 Goal status: IN PROGRESS  3.  Patient to demonstrate awareness of osteoporosis precautions as well as ability to achieve and maintain good spinal alignment/posturing and body mechanics needed for daily activities. Baseline:  Goal status: MET - 11/22/23  4.  Patient will demonstrate functional pain free B shoulder ROM to perform ADLs.   Baseline: Refer to above UE ROM table Goal status: IN PROGRESS - 11/27/23 - B shoulders improving in most planes except R ABD still limited by pain  5  Patient will demonstrate improved B shoulder and scapular strength to >/= 4+/5 for functional UE use Baseline: Refer to above UE MMT table Goal status: IN PROGRESS   6.  Patient will demonstrate improved B LE strength to >/= 4+/5 for improved stability and ease of mobility. Baseline: Refer to above LE MMT table Goal status: IN PROGRESS  7.  Patient will report </= 22% on QuickDASH (MCID = 10 pts) to demonstrate improved functional UE use with decreased pain interference. Baseline: 31.8 / 100 = 31.8 % Goal status: IN PROGRESS  8. Patient will report </= 18% on Modified Oswestry (MCID = 12%) to demonstrate improved functional ability with decreased pain interference. Baseline: 15 / 50 = 30.0 % Goal status: IN PROGRESS   PLAN:  PT FREQUENCY: 2x/week  PT DURATION: 8 weeks  PLANNED INTERVENTIONS: 97164- PT Re-evaluation, 97110-Therapeutic exercises, 97530-  Therapeutic activity, [id number]- Neuromuscular re-education, 97535- Self Care, [id number]- Manual therapy, [id number]- Gait training, [phone]- Aquatic Therapy, 97014- Electrical stimulation (unattended), [phone]- Electrical stimulation (manual), 97016- Vasopneumatic device, [id number]- Ultrasound, [id number]- Ionotophoresis 4mg /ml Dexamethasone, Patient/Family education, Balance training, Stair training, Taping, Dry Needling, Joint mobilization, Spinal mobilization, Cryotherapy, and Moist heat  PLAN FOR NEXT SESSION: Gently progress postural stretching and strengthening as well as core and proximal LE strengthening; review & update HEP PRN; MT +/- TPDN to address abnormal muscle tension restricting posture and R shoulder ROM; modalities as needed for pain management; review education on osteoporosis precautions as well as proper posture and body mechanics PRN   Marry Guan, PT 11/27/2023, 12:51 PM     Date of referral: 10/30/2023 Referring provider: Bradd Canary, MD Referring diagnosis? R53.1 (ICD-10-CM) - Weakness  (Osteoporosis, falls and weakness) Treatment diagnosis? (if different than referring diagnosis)  Muscle weakness (generalized)  Pain in thoracic spine  Pain of right upper arm  Stiffness of right shoulder, not elsewhere classified  Stiffness of left shoulder, not elsewhere classified  What was this (referring dx) caused by? Ongoing Issue and Other: Weakness related to osteoporosis with T8 compression fracture  Ashby Dawes of Condition: Chronic (continuous duration > 3 months)   Laterality: Both  Current Functional Measure Score: Back Index 15 / 50 = 30.0 %  & QuickDASH 31.8 / 100 =  31.8 %  Objective measurements identify impairments when they are compared to normal values, the uninvolved extremity, and prior level of function.  [x]  Yes  []  No  Objective assessment of functional ability: Moderate functional limitations   Briefly describe symptoms: Thoracic back pain from history of T8  compression fracture 1 year ago.  She also notes recent onset of R upper arm pain originating around the time she underwent lumpectomy and axillary node dissection for breast cancer. Current deficits include pain as described above, postural abnormalities, core and postural weakness with limited proximal UE ROM and strength as well as proximal LE weakness.    How did symptoms start: Sudden onset for thoracic back pain at time of T8 compression fracture on 11/13/2022.  Gradual onset for R upper arm pain.  Weakness as a sequelae to both.  Average pain intensity:  Last 24 hours: 3/10  Past week: 4-5/10  How often does the pt experience symptoms? Frequently  How much have the symptoms interfered with usual daily activities? Moderately  How has condition changed since care began at this facility? NA - initial visit  In general, how is the patients overall health? Good  Onset date: 10/2022 - T8 compression fracture; 08/2023 - R upper arm pain & L lumpectomy and axillary node dissection secondary to breast cancer   BACK PAIN (STarT Back Screening Tool) - (When applicable):  Has your back pain spread down your leg(s) at sometime in the last 2 weeks? []  Yes   [x]  No Have you had pain in the shoulder or neck at sometime in the past 2 weeks? [x]  Yes   []  No Have you only walked short distances because of your back pain? [x]  Yes   []  No In the past 2 weeks, have you dressed more slowly than usual because of your back pain? []  Yes   [x]  No Do you think it is not really safe for person with a condition like yours to be physically active? []  Yes   [x]  No Have worrying thoughts been going through your mind a lot of the time? []  Yes   [x]  No Do you feel that your back pain is terrible and it is never going to get any better? []  Yes   [x]  No In general, have you stopped enjoying all the things you usually enjoy? [x]  Yes   []  No Overall, how bothersome has your back pain been in the last 2 weeks? []   Not at all   []  Slightly     [x]  Moderate   []  Very much     []  Extremely

## 2023-11-29 ENCOUNTER — Encounter: Payer: Self-pay | Admitting: Physical Therapy

## 2023-11-29 ENCOUNTER — Ambulatory Visit: Payer: Medicare Other | Admitting: Physical Therapy

## 2023-11-29 DIAGNOSIS — M6281 Muscle weakness (generalized): Secondary | ICD-10-CM | POA: Diagnosis not present

## 2023-11-29 DIAGNOSIS — M25612 Stiffness of left shoulder, not elsewhere classified: Secondary | ICD-10-CM

## 2023-11-29 DIAGNOSIS — M25611 Stiffness of right shoulder, not elsewhere classified: Secondary | ICD-10-CM

## 2023-11-29 DIAGNOSIS — M79621 Pain in right upper arm: Secondary | ICD-10-CM

## 2023-11-29 DIAGNOSIS — M546 Pain in thoracic spine: Secondary | ICD-10-CM

## 2023-11-29 NOTE — Therapy (Signed)
OUTPATIENT PHYSICAL THERAPY TREATMENT   Patient Name: Yolanda Hernandez MRN: [id number] DOB:05-25-1956, 68 y.o., female Today's Date: 11/29/2023  END OF SESSION:  PT End of Session - 11/29/23 1146     Visit Number 6    Date for PT Re-Evaluation 01/03/24    Authorization Type [employer] Medicare    Authorization Time Period 11/08/23 - 01/03/24    Authorization - Visit Number 5    Authorization - Number of Visits 6    PT Start Time 1146    PT Stop Time 1235    PT Time Calculation (min) 49 min    Activity Tolerance Patient tolerated treatment well    Behavior During Therapy [facility] for tasks assessed/performed                 Past Medical History:  Diagnosis Date   Cerumen impaction    chronic   Cervical cancer screening 03/12/2015   Menarche at 10 Regular and moderate flow  history of abnormal pap in past, bx and cryo once many years ago, normal since. Last 2011 and normal G0P0, s/p No history of abnormal MGM Last in 2015, no breast concerns  No concerns today Biopsy, cervical: gyn surgeries Menopause at 53 ish   Conflict between patient and family 03/26/2021   Dysmenorrhea    Heart murmur 03/26/2021   Hematuria 07/31/2017   HPV in female 2019   Hyperglycemia 03/19/2013   Hypertension    Muscle cramping 03/26/2021   Osteopenia    Osteoporosis 04/11/2010   Qualifier: Diagnosis of  By: Nena Jordan    Other and unspecified hyperlipidemia 03/19/2013   Preventative health care 09/08/2011   Right calf pain 03/22/2021   Skin cancer    Skin cancer 07/14/2016   Uterine fibroid    Past Surgical History:  Procedure Laterality Date   BREAST SURGERY     biopsy left   COLPOSCOPY     GYNECOLOGIC CRYOSURGERY     MYRINGOPLASTY  1972   TONSILLECTOMY     Patient Active Problem List   Diagnosis Date Noted   Breast cancer, left (HCC) 10/30/2023   Weakness 03/20/2023   Closed wedge compression fracture of T8 vertebra (HCC) 03/20/2023   Chronic bilateral thoracic back pain  03/20/2023   Abnormal posture 03/20/2023   Thoracic compression fracture (HCC) 11/13/2022   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 04/24/2022   History of COVID-19 03/23/2022   Aortic insufficiency 06/08/2021   Ascending aortic aneurysm (HCC) 06/08/2021   Hypertension 06/07/2021   Conflict between patient and family 03/26/2021   Heart murmur 03/26/2021   Muscle cramping 03/26/2021   Right calf pain 03/22/2021   Excessive cerumen in right ear canal 01/14/2018   HPV in female 2019   Hematuria 07/31/2017   Asymmetric SNHL (sensorineural hearing loss) 11/29/2015   Subjective tinnitus of left ear 11/29/2015   Sensorineural hearing loss (SNHL), bilateral 11/29/2015   Cervical cancer screening 03/12/2015   Contact dermatitis 07/07/2013   Hyperlipidemia, mixed 03/19/2013   Hyperglycemia 03/19/2013   Osteopenia 03/19/2013   Preventative health care 09/08/2011   Osteoporosis 04/11/2010   FIBROIDS, UTERUS 03/07/2010   ELEVATED BLOOD PRESSURE WITHOUT DIAGNOSIS OF HYPERTENSION 03/07/2010   SKIN CANCER, HX OF 03/07/2010    PCP: Bradd Canary, MD   REFERRING PROVIDER: Bradd Canary, MD   REFERRING DIAG: R53.1 (ICD-10-CM) - Weakness  (Osteoporosis, falls and weakness)  THERAPY DIAG:  Muscle weakness (generalized)  Pain in thoracic spine  Pain of right  upper arm  Stiffness of right shoulder, not elsewhere classified  Stiffness of left shoulder, not elsewhere classified  RATIONALE FOR EVALUATION AND TREATMENT: Rehabilitation  ONSET DATE: 10/2022 - T8 compression fracture; 08/2023 - R upper arm pain & L lumpectomy and axillary node dissection secondary to breast cancer  NEXT MD VISIT: 05/01/24   SUBJECTIVE:   SUBJECTIVE STATEMENT: Pt reports only momentary pain with certain movements.  No pain currently.  EVAL: Pt reports h/o T8 compression fracture ~1 yr ago (11/13/22) while participating in a gym based exercise program focusing on osteoporosis (she describes standing  in slight squat while lifting a resistance machine in front of her when she felt a pop in her mid back).  She was initially told it was a muscle strain and only later identified as a compression fracture on x-ray.  She did complete an episode of PT following this with some benefit noted but later when trying to 3 reduce the exercises at home started to notice limitation due to R upper arm pain.  She has been walking and doing some weightbearing exercises but has not progressed her strengthening and would like to do so.  In November she was diagnosed with breast cancer and underwent a lumpectomy and axillary node dissection on the left for which she was prescribed a HEP to prevent lymphedema.  She she continues to note some pain in her R upper arm over the past few months which limits her ability to reach with her R UE, perform her lymphedema exercises or sleep on her R side.  PAIN: Are you having pain? No and Yes: NPRS scale: 0/10  Pain location: mid back (~T8) Pain description: stiff, achy, uncomfortable Aggravating factors: cleaning (scrubbing the tub, vacuuming, esp when she does a lot in a day), otherwise unpredictable Relieving factors: heating pad, Tylenol  Are you having pain? No and Yes: NPRS scale: 0/10  Pain location: R lateral upper arm/shoulder Pain description: quick onset, pulling Aggravating factors: laying on R side, overhead shoulder ER (pec stretch), end range OH ROM Relieving factors: stopping triggering movement  PERTINENT HISTORY: T8 compression fracture 10/2022, osteoporosis, chronic B thoracic back pain, HTN, muscle cramping, L breast cancer, aortic insufficiency, ascending aortic aneurysm, B sensorineural hearing loss  PRECAUTIONS: Other: Osteoporosis with h/o thoracic compression fx  HAND DOMINANCE: Right  RED FLAGS: None  WEIGHT BEARING RESTRICTIONS: No  FALLS:  Has patient fallen in last 6 months? 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 To get rid of the arm pain and minimize the back pain. To stay active."   OBJECTIVE: (objective measures completed at initial evaluation unless otherwise dated)  DIAGNOSTIC FINDINGS:  06/20/23 - MR thoracic spine: IMPRESSION: 1. No acute osseous injury of the thoracic spine. 2. Chronic T8 vertebral body compression fracture with 50% height loss anteriorly.  04/18/23 - XR thoracic spine: T8 compression fracture with anterior height loss.   No significant change in height loss since films on 12/12/2022. No other  fracture seen.  No spondylolisthesis seen.   PATIENT  SURVEYS:  ABC scale 1390 / 1600 = 86.9 %, > 80% indicates high level of physical functioning Modified Oswestry 15 / 50 = 30.0 %, indicates moderate disability  Quick Dash 31.8 / 100 = 31.8 %  COGNITION: Overall cognitive status: Within functional limits for tasks assessed     SENSATION: WFL  POSTURE:  rounded shoulders, forward head, and increased thoracic kyphosis  CERVICAL ROM:  Grossly WFL/WNL  UPPER EXTREMITY ROM:   Active ROM Right eval Left eval R 11/27/23 L 11/27/23  Shoulder flexion 120 124 128 129  Shoulder extension 40 ^ 46 50 50  Shoulder abduction 142 ^ 138 114 ^ 136  Shoulder adduction      Shoulder internal rotation FIR L3 ^ FIR T12 FIR L1 FIR T12  Shoulder external rotation FER T3 ^ FER T3 FER T5 FER T5  (Blank rows = not tested, ^ - increased pain)  UPPER EXTREMITY MMT:  MMT Right eval Left eval  Shoulder flexion 4 4  Shoulder extension 4 4  Shoulder  abduction 4 4  Shoulder adduction    Shoulder internal rotation 4+ 4+  Shoulder external rotation 4 4  Middle trapezius 4- 4-  Lower trapezius Unable Unable  Elbow flexion    Elbow extension    Wrist flexion    Wrist extension    Wrist ulnar deviation    Wrist radial deviation    Wrist pronation    Wrist supination    Grip strength (lbs)    (Blank rows = not tested)  MUSCLE LENGTH: Hamstrings: Mild tight B ITB: WFL Piriformis: Mild tight B Hip flexors: WFL Quads: Mild tight B  LOWER EXTREMITY ROM: Grossly WFL/WNL  LOWER EXTREMITY MMT:  MMT Right eval Left eval  Hip flexion 4- 4-  Hip extension 4+ 4+  Hip abduction 4- 4-  Hip adduction 4- 4-  Hip internal rotation 4+ 4+  Hip external rotation 3+ 3+  Knee flexion 5 5  Knee extension 5 5  Ankle dorsiflexion 5 5  Ankle plantarflexion    Ankle inversion    Ankle eversion     (Blank rows = not tested)  BED MOBILITY:  Independent  TRANSFERS: Assistive device utilized: None  Sit to stand: Complete Independence Stand to sit: Complete Independence Chair to chair: Complete Independence Floor:  NT  GAIT: Distance walked: Clinic distances Assistive device utilized: None Level of assistance: Complete Independence Gait pattern: WFL For  TODAY'S TREATMENT:  11/29/2023 THERAPEUTIC EXERCISE: To improve strength and endurance.  Demonstration, verbal and tactile cues throughout for technique.  NuStep - L5 x 6 min (UE/LE) Wall push-up x 10 Wall push-up plus x 10  Demonstrated hip ADD ball squeeze + single LE LAQ to promote increased VMO activation as pt noting intermittent L infrapatellar pain with lunging motions or stair negotiation (not attempted)  NEUROMUSCULAR RE-EDUCATION: To improve coordination, kinesthetic sense, and posture.  Lower trap setting with UE wall slide x 10 Prone over orange Pball with knees on Airex pad: I's x 10 T's x 10 - increased discomfort in L shoulder Y's x 3 - increased discomfort in  R shoulder - deferred W's x 10 Hooklying TrA + sequential march x 10 Hooklying TrA + dead bug alt UE flexion/LE extension x 10  MANUAL THERAPY: To promote normalized muscle tension, improved flexibility, improved joint mobility, increased ROM, and reduced pain utilizing therapeutic massage and manual TP therapy. STM/DTM and manual TPR to L lateral deltoid, teres group and subscapularis   11/27/2023  THERAPEUTIC  EXERCISE: To improve strength, endurance, ROM, and flexibility.  Demonstration, verbal and tactile cues throughout for technique.  Rec bike - L4 x 6 min Hooklying TrA + GTB hip flexion march 10 x 3" Hooklying TrA + GTB bent knee fallout 10 x 3" Hooklying B scap retraction + RTB horiz ABD x 10  NEUROMUSCULAR RE-EDUCATION: To improve kinesthetic sense and posture.  Seated scapular retraction into pool noodle along spine in back of chair + B shoulder ER x 10 Seated scapular retraction into pool noodle along spine in back of chair + YTB B shoulder ER x 10 Seated scapular retraction into pool noodle along spine in back of chair + YTB B low angle shoulder horiz ABD x 10 Standing TrA + YTB scapular retraction + B shoulder row x 10 Standing TrA + YTB scapular retraction + B shoulder extension x 10 Standing TrA + YTB scapular retraction + B shoulder extension & long-arm ER x 10  THERAPEUTIC ACTIVITIES: To improve functional performance.  Demonstration, verbal and tactile cues throughout for technique. Worked on floor to stand transfers via log roll to sidelying to kneeling into stand to promote neutral spine    11/22/2023 THERAPEUTIC EXERCISE: To improve strength, endurance, ROM, and flexibility.  Demonstration, verbal and tactile cues throughout for technique.  Rec bike - L3 x 6 min L shoulder distraction with PROM into flexion and IR/ER in scapular plane - improved flexion and ER ROM with decreased pain following DN Hooklying B scap retraction + YTB shoulder ER x 10 Hooklying B scap  retraction + YTB horiz ABD diagonals x 10 Verbal clarification of movement patterns for core/LE strengthening added to HEP last visit  MANUAL THERAPY: To promote normalized muscle tension, improved flexibility, improved joint mobility, increased ROM, and reduced pain. Trigger Point Dry Needling: Treatment instructions/education: Initial Treatment: Pt instructed on Dry Needling rational, procedures, and possible side effects. Pt instructed to expect mild to moderate muscle soreness later in the day and/or into the next day.  Pt instructed in methods to reduce muscle soreness. Pt instructed to continue prescribed HEP. Patient was educated on signs and symptoms of infection and other risk factors and advised to seek medical attention should they occur.  Patient verbalized understanding of these instructions and education.  Education Handout Provided: Yes Consent: Patient Verbal Consent Given: Yes Treatment: Muscles Treated: R anterior deltoid Skilled palpation and monitoring of soft tissue during DN Electrical Stimulation Performed: No Treatment Response/Outcome: Twitch Response Elicited, Palpable Increase in Muscle Length, Decreased TTP, and Improved Exercise Tolerance STM/DTM, manual TPR and pin & stretch to muscles addressed with DN R shoulder gentle distraction and oscillation for muscle relaxation  R shoulder inferior and A/P mobs - grade II-III   PATIENT EDUCATION:  Education details: continue with current HEP Person educated: Patient Education method: Explanation Education comprehension: verbalized understanding  HOME EXERCISE PROGRAM: Access Code: [email or web address] URL: [email or web address] Date: 11/27/2023 Prepared by: Glenetta Hew  Exercises - Doorway Pec Stretch at 60 Degrees Abduction with Arm Straight  - 2 x daily - 7 x weekly - 3 reps - 30 sec hold - Seated Thoracic Lumbar Extension  - 1-2 x daily - 7 x weekly - 2 sets - 10 reps - 3 sec hold - Supine Shoulder  External Rotation with Dowel  - 1-2 x daily - 7 x weekly - 2 sets - 10 reps - 3 sec hold - Supine Shoulder Flexion AAROM with Dowel  - 1-2 x daily - 7 x weekly - 2 sets -  10 reps - 3 sec hold - Supine Shoulder Protraction with Dowel  - 1-2 x daily - 7 x weekly - 2 sets - 10 reps - 3 sec hold - Shoulder Flexion Wall Slide with Towel  - 1-2 x daily - 7 x weekly - 2 sets - 10 reps - 3 sec hold - Supine Shoulder Horizontal Abduction with Resistance  - 1 x daily - 3-4 x weekly - 2 sets - 10 reps - 3 sec hold - Hooklying Single Leg Bent Knee Fallouts with Resistance  - 1 x daily - 3-4 x weekly - 2 sets - 10 reps - 3 sec hold - Supine March with Resistance Band  - 1 x daily - 3-4 x weekly - 2 sets - 10 reps - 2-3 sec hold hold - Supine Hip Adduction Isometric with Ball  - 2 x daily - 3-4 x weekly - 2 sets - 10 reps - 5 sec hold - Seated Bilateral Shoulder External Rotation with Resistance  - 1 x daily - 3-4 x weekly - 2 sets - 10 reps - 3 sec hold - Standing Bilateral Low Shoulder Row with Anchored Resistance  - 1 x daily - 3-4 x weekly - 2 sets - 10 reps - 5 sec hold - Scapular Retraction with Resistance Advanced  - 1 x daily - 3-4 x weekly - 2 sets - 10 reps - 5 sec hold - Low Trap Setting at Wall  - 1 x daily - 3-4 x weekly - 2 sets - 10 reps - 3 sec hold  Patient Education - Do it right & prevent fractures - Osteoporosis education - Posture and Body Mechanics - Trigger Point Dry Needling   ASSESSMENT:  CLINICAL IMPRESSION: Shanice reports her pain is ~20-25% improved - now only momentary and short-lived (doesn't linger), triggered by certain movements but movements unpredictable.  Attempts at strengthening progression increasing gravity influence limited by pain in both R and L shoulders with different exercises.  MT addressing ongoing TPs/taut bands in R shoulder complex with some relief of TTP reported.  Also progressed lower core/lumbopelvic strengthening with progression of hooklying exercises.   As we were concluding the visit, Terresa mentioned that she has noted some intermittent L anterior knee pain with lunges and stair negotiation.  Quick assessment of patellar tracking and VMO activation with quad contraction revealing limited delayed VMO on L>R, therefore provided verbal instruction on how to progress one of her current HEP exercises to add VMO emphasis.  Will attempt to incorporate further proximal LE strengthening with quad/VMO emphasis into future exercise progression.  Anhar will benefit from skilled PT to address above deficits to improve mobility and activity tolerance while safely progressing strengthening with decreased pain interference.   OBJECTIVE IMPAIRMENTS: decreased activity tolerance, decreased knowledge of condition, difficulty walking, decreased ROM, decreased strength, decreased safety awareness, hypomobility, increased fascial restrictions, impaired perceived functional ability, increased muscle spasms, impaired flexibility, impaired UE functional use, improper body mechanics, postural dysfunction, and pain.   ACTIVITY LIMITATIONS: carrying, lifting, bending, sitting, standing, reach over head, locomotion level, and caring for others  PARTICIPATION LIMITATIONS: meal prep, cleaning, laundry, driving, shopping, community activity, and yard work  PERSONAL FACTORS: Past/current experiences, Time since onset of injury/illness/exacerbation, and 3+ comorbidities: T8 compression fracture 10/2022, osteoporosis, chronic B thoracic back pain, HTN, muscle cramping, L breast cancer, aortic insufficiency, ascending aortic aneurysm, B sensorineural hearing loss  are also affecting patient's functional outcome.   REHAB POTENTIAL: Excellent  CLINICAL DECISION MAKING: Evolving/moderate complexity  EVALUATION COMPLEXITY: Moderate   GOALS: Goals reviewed with patient? Yes  SHORT TERM GOALS: Target date: 12/06/2023   Patient will be independent with initial HEP. Baseline: HEP  initiated on eval Goal status: MET - 11/27/23   2.  Patient will report 25% improvement in mid back & R UE pain to improve QOL. Baseline: Thoracic back pain up to 5/10, R upper arm pain up to 4-5/10 Goal status: PARTIALLY MET - 11/29/23 - pain typically only momentary with certain movements now and does not seem to linger ~20-25% improvement   LONG TERM GOALS: Target date: 01/03/2024  Patient will be independent with ongoing/advanced HEP for self-management at home.  Baseline:  Goal status: IN PROGRESS  2.  Patient will report 50-75% improvement in low back and R UE pain to improve QOL.  Baseline:  Thoracic back pain up to 5/10, R upper arm pain up to 4-5/10 Goal status: IN PROGRESS  3.  Patient to demonstrate awareness of osteoporosis precautions as well as ability to achieve and maintain good spinal alignment/posturing and body mechanics needed for daily activities. Baseline:  Goal status: MET - 11/22/23  4.  Patient will demonstrate functional pain free B shoulder ROM to perform ADLs.   Baseline: Refer to above UE ROM table Goal status: IN PROGRESS - 11/27/23 - B shoulders improving in most planes except R ABD still limited by pain  5  Patient will demonstrate improved B shoulder and scapular strength to >/= 4+/5 for functional UE use Baseline: Refer to above UE MMT table Goal status: IN PROGRESS   6.  Patient will demonstrate improved B LE strength to >/= 4+/5 for improved stability and ease of mobility. Baseline: Refer to above LE MMT table Goal status: IN PROGRESS  7.  Patient will report </= 22% on QuickDASH (MCID = 10 pts) to demonstrate improved functional UE use with decreased pain interference. Baseline: 31.8 / 100 = 31.8 % Goal status: IN PROGRESS  8. Patient will report </= 18% on Modified Oswestry (MCID = 12%) to demonstrate improved functional ability with decreased pain interference. Baseline: 15 / 50 = 30.0 % Goal status: IN PROGRESS   PLAN:  PT FREQUENCY:  2x/week  PT DURATION: 8 weeks  PLANNED INTERVENTIONS: 97164- PT Re-evaluation, 97110-Therapeutic exercises, 97530- Therapeutic activity, [id number]- Neuromuscular re-education, 97535- Self Care, 16109- Manual therapy, [id number]- Gait training, [phone]- Aquatic Therapy, 97014- Electrical stimulation (unattended), [phone]- Electrical stimulation (manual), 97016- Vasopneumatic device, [id number]- Ultrasound, [id number]- Ionotophoresis 4mg /ml Dexamethasone, Patient/Family education, Balance training, Stair training, Taping, Dry Needling, Joint mobilization, Spinal mobilization, Cryotherapy, and Moist heat  PLAN FOR NEXT SESSION: Gently progress postural stretching and strengthening as well as core and proximal LE (hip and quad/VMO) strengthening; review & update HEP PRN; MT +/- TPDN to address abnormal muscle tension restricting posture and R shoulder ROM; modalities as needed for pain management; review education on osteoporosis precautions as well as proper posture and body mechanics PRN   Marry Guan, PT 11/29/2023, 2:44 PM     Date of referral: 10/30/2023 Referring provider: Bradd Canary, MD Referring diagnosis? R53.1 (ICD-10-CM) - Weakness  (Osteoporosis, falls and weakness) Treatment diagnosis? (if different than referring diagnosis)  Muscle weakness (generalized)  Pain in thoracic spine  Pain of right upper arm  Stiffness of right shoulder, not elsewhere classified  Stiffness of left shoulder, not elsewhere classified  What was this (referring dx) caused by? Ongoing Issue and Other: Weakness related to osteoporosis with T8 compression fracture  Ashby Dawes of Condition: Chronic (continuous  duration > 3 months)   Laterality: Both  Current Functional Measure Score: Back Index 15 / 50 = 30.0 %  & QuickDASH 31.8 / 100 = 31.8 %  Objective measurements identify impairments when they are compared to normal values, the uninvolved extremity, and prior level of function.  [x]  Yes  []  No  Objective  assessment of functional ability: Moderate functional limitations   Briefly describe symptoms: Thoracic back pain from history of T8 compression fracture 1 year ago.  She also notes recent onset of R upper arm pain originating around the time she underwent lumpectomy and axillary node dissection for breast cancer. Current deficits include pain as described above, postural abnormalities, core and postural weakness with limited proximal UE ROM and strength as well as proximal LE weakness.    How did symptoms start: Sudden onset for thoracic back pain at time of T8 compression fracture on 11/13/2022.  Gradual onset for R upper arm pain.  Weakness as a sequelae to both.  Average pain intensity:  Last 24 hours: 3/10  Past week: 4-5/10  How often does the pt experience symptoms? Frequently  How much have the symptoms interfered with usual daily activities? Moderately  How has condition changed since care began at this facility? NA - initial visit  In general, how is the patients overall health? Good  Onset date: 10/2022 - T8 compression fracture; 08/2023 - R upper arm pain & L lumpectomy and axillary node dissection secondary to breast cancer   BACK PAIN (STarT Back Screening Tool) - (When applicable):  Has your back pain spread down your leg(s) at sometime in the last 2 weeks? []  Yes   [x]  No Have you had pain in the shoulder or neck at sometime in the past 2 weeks? [x]  Yes   []  No Have you only walked short distances because of your back pain? [x]  Yes   []  No In the past 2 weeks, have you dressed more slowly than usual because of your back pain? []  Yes   [x]  No Do you think it is not really safe for person with a condition like yours to be physically active? []  Yes   [x]  No Have worrying thoughts been going through your mind a lot of the time? []  Yes   [x]  No Do you feel that your back pain is terrible and it is never going to get any better? []  Yes   [x]  No In general, have you  stopped enjoying all the things you usually enjoy? [x]  Yes   []  No Overall, how bothersome has your back pain been in the last 2 weeks? []  Not at all   []  Slightly     [x]  Moderate   []  Very much     []  Extremely

## 2023-12-04 ENCOUNTER — Ambulatory Visit: Payer: Medicare Other | Admitting: Physical Therapy

## 2023-12-04 ENCOUNTER — Encounter: Payer: Self-pay | Admitting: Physical Therapy

## 2023-12-04 DIAGNOSIS — M6281 Muscle weakness (generalized): Secondary | ICD-10-CM | POA: Diagnosis not present

## 2023-12-04 DIAGNOSIS — M546 Pain in thoracic spine: Secondary | ICD-10-CM

## 2023-12-04 DIAGNOSIS — M25612 Stiffness of left shoulder, not elsewhere classified: Secondary | ICD-10-CM

## 2023-12-04 DIAGNOSIS — M25611 Stiffness of right shoulder, not elsewhere classified: Secondary | ICD-10-CM

## 2023-12-04 DIAGNOSIS — M79621 Pain in right upper arm: Secondary | ICD-10-CM

## 2023-12-04 NOTE — Therapy (Signed)
OUTPATIENT PHYSICAL THERAPY TREATMENT   Patient Name: Yolanda Hernandez MRN: [id number] DOB:1955/11/10, 68 y.o., female Today's Date: 12/04/2023  END OF SESSION:  PT End of Session - 12/04/23 1151     Visit Number 7    Date for PT Re-Evaluation 01/03/24    Authorization Type [employer] Medicare    Authorization Time Period 11/08/23 - 01/03/24    Authorization - Visit Number 6    Authorization - Number of Visits 6    PT Start Time 1151    PT Stop Time 1233    PT Time Calculation (min) 42 min    Activity Tolerance Patient tolerated treatment well    Behavior During Therapy [facility] for tasks assessed/performed                  Past Medical History:  Diagnosis Date   Cerumen impaction    chronic   Cervical cancer screening 03/12/2015   Menarche at 10 Regular and moderate flow  history of abnormal pap in past, bx and cryo once many years ago, normal since. Last 2011 and normal G0P0, s/p No history of abnormal MGM Last in 2015, no breast concerns  No concerns today Biopsy, cervical: gyn surgeries Menopause at 53 ish   Conflict between patient and family 03/26/2021   Dysmenorrhea    Heart murmur 03/26/2021   Hematuria 07/31/2017   HPV in female 2019   Hyperglycemia 03/19/2013   Hypertension    Muscle cramping 03/26/2021   Osteopenia    Osteoporosis 04/11/2010   Qualifier: Diagnosis of  By: Nena Jordan    Other and unspecified hyperlipidemia 03/19/2013   Preventative health care 09/08/2011   Right calf pain 03/22/2021   Skin cancer    Skin cancer 07/14/2016   Uterine fibroid    Past Surgical History:  Procedure Laterality Date   BREAST SURGERY     biopsy left   COLPOSCOPY     GYNECOLOGIC CRYOSURGERY     MYRINGOPLASTY  1972   TONSILLECTOMY     Patient Active Problem List   Diagnosis Date Noted   Breast cancer, left (HCC) 10/30/2023   Weakness 03/20/2023   Closed wedge compression fracture of T8 vertebra (HCC) 03/20/2023   Chronic bilateral thoracic back pain  03/20/2023   Abnormal posture 03/20/2023   Thoracic compression fracture (HCC) 11/13/2022   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 04/24/2022   History of COVID-19 03/23/2022   Aortic insufficiency 06/08/2021   Ascending aortic aneurysm (HCC) 06/08/2021   Hypertension 06/07/2021   Conflict between patient and family 03/26/2021   Heart murmur 03/26/2021   Muscle cramping 03/26/2021   Right calf pain 03/22/2021   Excessive cerumen in right ear canal 01/14/2018   HPV in female 2019   Hematuria 07/31/2017   Asymmetric SNHL (sensorineural hearing loss) 11/29/2015   Subjective tinnitus of left ear 11/29/2015   Sensorineural hearing loss (SNHL), bilateral 11/29/2015   Cervical cancer screening 03/12/2015   Contact dermatitis 07/07/2013   Hyperlipidemia, mixed 03/19/2013   Hyperglycemia 03/19/2013   Osteopenia 03/19/2013   Preventative health care 09/08/2011   Osteoporosis 04/11/2010   FIBROIDS, UTERUS 03/07/2010   ELEVATED BLOOD PRESSURE WITHOUT DIAGNOSIS OF HYPERTENSION 03/07/2010   SKIN CANCER, HX OF 03/07/2010    PCP: Bradd Canary, MD   REFERRING PROVIDER: Bradd Canary, MD   REFERRING DIAG: R53.1 (ICD-10-CM) - Weakness  (Osteoporosis, falls and weakness)  THERAPY DIAG:  Muscle weakness (generalized)  Pain in thoracic spine  Pain of  right upper arm  Stiffness of right shoulder, not elsewhere classified  Stiffness of left shoulder, not elsewhere classified  RATIONALE FOR EVALUATION AND TREATMENT: Rehabilitation  ONSET DATE: 10/2022 - T8 compression fracture; 08/2023 - R upper arm pain & L lumpectomy and axillary node dissection secondary to breast cancer  NEXT MD VISIT: 05/01/24   SUBJECTIVE:   SUBJECTIVE STATEMENT: Pt denies pain or other concerns.  EVAL: Pt reports h/o T8 compression fracture ~1 yr ago (11/13/22) while participating in a gym based exercise program focusing on osteoporosis (she describes standing in slight squat while lifting a  resistance machine in front of her when she felt a pop in her mid back).  She was initially told it was a muscle strain and only later identified as a compression fracture on x-ray.  She did complete an episode of PT following this with some benefit noted but later when trying to 3 reduce the exercises at home started to notice limitation due to R upper arm pain.  She has been walking and doing some weightbearing exercises but has not progressed her strengthening and would like to do so.  In November she was diagnosed with breast cancer and underwent a lumpectomy and axillary node dissection on the left for which she was prescribed a HEP to prevent lymphedema.  She she continues to note some pain in her R upper arm over the past few months which limits her ability to reach with her R UE, perform her lymphedema exercises or sleep on her R side.  PAIN: Are you having pain? No and Yes: NPRS scale: 0/10  Pain location: mid back (~T8) Pain description: stiff, achy, uncomfortable Aggravating factors: cleaning (scrubbing the tub, vacuuming, esp when she does a lot in a day), otherwise unpredictable Relieving factors: heating pad, Tylenol  Are you having pain? No and Yes: NPRS scale: 0/10  Pain location: R lateral upper arm/shoulder Pain description: quick onset, pulling Aggravating factors: laying on R side, overhead shoulder ER (pec stretch), end range OH ROM Relieving factors: stopping triggering movement  PERTINENT HISTORY: T8 compression fracture 10/2022, osteoporosis, chronic B thoracic back pain, HTN, muscle cramping, L breast cancer, aortic insufficiency, ascending aortic aneurysm, B sensorineural hearing loss  PRECAUTIONS: Other: Osteoporosis with h/o thoracic compression fx  HAND DOMINANCE: Right  RED FLAGS: None  WEIGHT BEARING RESTRICTIONS: No  FALLS:  Has patient fallen in last 6 months? 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 To get rid of the arm pain and minimize the back pain. To stay active."   OBJECTIVE: (objective measures completed at initial evaluation unless otherwise dated)  DIAGNOSTIC FINDINGS:  06/20/23 - MR thoracic spine: IMPRESSION: 1. No acute osseous injury of the thoracic spine. 2. Chronic T8 vertebral body compression fracture with 50% height loss anteriorly.  04/18/23 - XR thoracic spine: T8 compression fracture with anterior height loss.   No significant change in height loss since films on 12/12/2022. No other  fracture seen.  No spondylolisthesis seen.   PATIENT SURVEYS:  ABC scale 1390 /  1600 = 86.9 %, > 80% indicates high level of physical functioning Modified Oswestry 15 / 50 = 30.0 %, indicates moderate disability  Quick Dash 31.8 / 100 = 31.8 %  12/04/23: Modified Oswestry: 12 / 50 = 24.0 % QuickDASH: 31.8 / 100 = 31.8 %  COGNITION: Overall cognitive status: Within functional limits for tasks assessed     SENSATION: WFL  POSTURE:  rounded shoulders, forward head, and increased thoracic kyphosis  CERVICAL ROM:  Grossly WFL/WNL  UPPER EXTREMITY ROM:   Active ROM Right eval Left eval R 11/27/23 L 11/27/23  Shoulder flexion 120 124 128 129  Shoulder extension 40 ^ 46 50 50  Shoulder abduction 142 ^ 138 114 ^ 136  Shoulder adduction      Shoulder internal rotation FIR L3 ^ FIR T12 FIR L1 FIR T12  Shoulder external rotation FER T3 ^ FER T3 FER T5 FER T5  (Blank rows = not tested, ^ - increased pain)  UPPER EXTREMITY MMT:  MMT Right eval Left eval  Shoulder flexion 4 4   Shoulder extension 4 4  Shoulder abduction 4 4  Shoulder adduction    Shoulder internal rotation 4+ 4+  Shoulder external rotation 4 4  Middle trapezius 4- 4-  Lower trapezius Unable Unable  Elbow flexion    Elbow extension    Wrist flexion    Wrist extension    Wrist ulnar deviation    Wrist radial deviation    Wrist pronation    Wrist supination    Grip strength (lbs)    (Blank rows = not tested)  MUSCLE LENGTH: Hamstrings: Mild tight B ITB: WFL Piriformis: Mild tight B Hip flexors: WFL Quads: Mild tight B  LOWER EXTREMITY ROM: Grossly WFL/WNL  LOWER EXTREMITY MMT:  MMT Right eval Left eval  Hip flexion 4- 4-  Hip extension 4+ 4+  Hip abduction 4- 4-  Hip adduction 4- 4-  Hip internal rotation 4+ 4+  Hip external rotation 3+ 3+  Knee flexion 5 5  Knee extension 5 5  Ankle dorsiflexion 5 5  Ankle plantarflexion    Ankle inversion    Ankle eversion     (Blank rows = not tested)  BED MOBILITY:  Independent  TRANSFERS: Assistive device utilized: None  Sit to stand: Complete Independence Stand to sit: Complete Independence Chair to chair: Complete Independence Floor:  NT  GAIT: Distance walked: Clinic distances Assistive device utilized: None Level of assistance: Complete Independence Gait pattern: WFL   TODAY'S TREATMENT:  12/04/2023 THERAPEUTIC ACTIVITIES: To improve functional performance.  Demonstration, verbal and tactile cues throughout for technique. Modified Oswestry: 12 / 50 = 24.0 % QuickDASH: 31.8 / 100 = 31.8 %  THERAPEUTIC EXERCISE: To improve strength and endurance.  Demonstration, verbal and tactile cues throughout for technique.  Rec Bike - L5 x 6 min BATCA lat pulldown 5# x 10 BATCA seated row 15# x 10 - performed w/o chest pad to increase core/abdominal muscle engagement BATCA chest press 5# x 10 - cues to avoid rounding away from seat back   NEUROMUSCULAR RE-EDUCATION: To improve coordination, kinesthesia, and  posture. Serratus roll up on foam roller on wall x 10 Serratus wall slide with arms in pillowcase x 10   11/29/2023 THERAPEUTIC EXERCISE: To improve strength and endurance.  Demonstration, verbal and tactile cues throughout for technique.  NuStep - L5 x 6 min (UE/LE) Wall push-up x 10 Wall push-up plus x 10  Demonstrated hip ADD ball squeeze + single LE LAQ  to promote increased VMO activation as pt noting intermittent L infrapatellar pain with lunging motions or stair negotiation (not attempted)  NEUROMUSCULAR RE-EDUCATION: To improve coordination, kinesthetic sense, and posture.  Lower trap setting with UE wall slide x 10 Prone over orange Pball with knees on Airex pad: I's x 10 T's x 10 - increased discomfort in L shoulder Y's x 3 - increased discomfort in R shoulder - deferred W's x 10 Hooklying TrA + sequential march x 10 Hooklying TrA + dead bug alt UE flexion/LE extension x 10  MANUAL THERAPY: To promote normalized muscle tension, improved flexibility, improved joint mobility, increased ROM, and reduced pain utilizing therapeutic massage and manual TP therapy. STM/DTM and manual TPR to L lateral deltoid, teres group and subscapularis   11/27/2023  THERAPEUTIC EXERCISE: To improve strength, endurance, ROM, and flexibility.  Demonstration, verbal and tactile cues throughout for technique.  Rec bike - L4 x 6 min Hooklying TrA + GTB hip flexion march 10 x 3" Hooklying TrA + GTB bent knee fallout 10 x 3" Hooklying B scap retraction + RTB horiz ABD x 10  NEUROMUSCULAR RE-EDUCATION: To improve kinesthetic sense and posture.  Seated scapular retraction into pool noodle along spine in back of chair + B shoulder ER x 10 Seated scapular retraction into pool noodle along spine in back of chair + YTB B shoulder ER x 10 Seated scapular retraction into pool noodle along spine in back of chair + YTB B low angle shoulder horiz ABD x 10 Standing TrA + YTB scapular retraction + B shoulder row x  10 Standing TrA + YTB scapular retraction + B shoulder extension x 10 Standing TrA + YTB scapular retraction + B shoulder extension & long-arm ER x 10  THERAPEUTIC ACTIVITIES: To improve functional performance.  Demonstration, verbal and tactile cues throughout for technique. Worked on floor to stand transfers via log roll to sidelying to kneeling into stand to promote neutral spine    PATIENT EDUCATION:  Education details: continue with current HEP and instruction in appropriate gym machines to promote upper body strengthening and which machines to avoid d/t her osteoporosis Person educated: Patient Education method: Medical illustrator Education comprehension: verbalized understanding and returned demonstration  HOME EXERCISE PROGRAM: Access Code: [email or web address] URL: [email or web address] Date: 11/27/2023 Prepared by: Glenetta Hew  Exercises - Doorway Pec Stretch at 60 Degrees Abduction with Arm Straight  - 2 x daily - 7 x weekly - 3 reps - 30 sec hold - Seated Thoracic Lumbar Extension  - 1-2 x daily - 7 x weekly - 2 sets - 10 reps - 3 sec hold - Supine Shoulder External Rotation with Dowel  - 1-2 x daily - 7 x weekly - 2 sets - 10 reps - 3 sec hold - Supine Shoulder Flexion AAROM with Dowel  - 1-2 x daily - 7 x weekly - 2 sets - 10 reps - 3 sec hold - Supine Shoulder Protraction with Dowel  - 1-2 x daily - 7 x weekly - 2 sets - 10 reps - 3 sec hold - Shoulder Flexion Wall Slide with Towel  - 1-2 x daily - 7 x weekly - 2 sets - 10 reps - 3 sec hold - Supine Shoulder Horizontal Abduction with Resistance  - 1 x daily - 3-4 x weekly - 2 sets - 10 reps - 3 sec hold - Hooklying Single Leg Bent Knee Fallouts with Resistance  - 1 x daily - 3-4 x weekly - 2 sets - 10  reps - 3 sec hold - Supine March with Resistance Band  - 1 x daily - 3-4 x weekly - 2 sets - 10 reps - 2-3 sec hold hold - Supine Hip Adduction Isometric with Ball  - 2 x daily - 3-4 x weekly - 2 sets - 10 reps  - 5 sec hold - Seated Bilateral Shoulder External Rotation with Resistance  - 1 x daily - 3-4 x weekly - 2 sets - 10 reps - 3 sec hold - Standing Bilateral Low Shoulder Row with Anchored Resistance  - 1 x daily - 3-4 x weekly - 2 sets - 10 reps - 5 sec hold - Scapular Retraction with Resistance Advanced  - 1 x daily - 3-4 x weekly - 2 sets - 10 reps - 5 sec hold - Low Trap Setting at Wall  - 1 x daily - 3-4 x weekly - 2 sets - 10 reps - 3 sec hold  Patient Education - Do it right & prevent fractures - Osteoporosis education - Posture and Body Mechanics - Trigger Point Dry Needling   ASSESSMENT:  CLINICAL IMPRESSION: Mackena reports her pain remains more intermittent but will still get up to 5/10 in intensity with some motions.  Modified Oswestry has demonstrated a 3 point (6%) improvement from baseline, however QuickDASH remain unchanged.  She feels that her legs are getting stronger but her upper body remains weaker, therefore focused more on core and postural strengthening and scapular stabilization with UE emphasis today.  She mentioned that she still has Silver Sneakers type coverage for a gym membership but has been reluctant to return to the gym as she is afraid that the trainers/exercise physiologists would not know how to guide her with exercises while recognizing what precautions she needs to take related to the osteoporosis (her compression fracture occurred during a gym-based exercise class), therefore provided instruction and guidance in gym-based weight machines for upper body.  Harlo continues to demonstrate progress toward her PT goals and will benefit from continued skilled PT to address ongoing deficits and impairments to improve mobility and activity tolerance while safely progressing strengthening with decreased pain interference.   OBJECTIVE IMPAIRMENTS: decreased activity tolerance, decreased knowledge of condition, difficulty walking, decreased ROM, decreased strength, decreased  safety awareness, hypomobility, increased fascial restrictions, impaired perceived functional ability, increased muscle spasms, impaired flexibility, impaired UE functional use, improper body mechanics, postural dysfunction, and pain.   ACTIVITY LIMITATIONS: carrying, lifting, bending, sitting, standing, reach over head, locomotion level, and caring for others  PARTICIPATION LIMITATIONS: meal prep, cleaning, laundry, driving, shopping, community activity, and yard work  PERSONAL FACTORS: Past/current experiences, Time since onset of injury/illness/exacerbation, and 3+ comorbidities: T8 compression fracture 10/2022, osteoporosis, chronic B thoracic back pain, HTN, muscle cramping, L breast cancer, aortic insufficiency, ascending aortic aneurysm, B sensorineural hearing loss  are also affecting patient's functional outcome.   REHAB POTENTIAL: Excellent  CLINICAL DECISION MAKING: Evolving/moderate complexity  EVALUATION COMPLEXITY: Moderate   GOALS: Goals reviewed with patient? Yes  SHORT TERM GOALS: Target date: 12/06/2023   Patient will be independent with initial HEP. Baseline: HEP initiated on eval Goal status: MET - 11/27/23   2.  Patient will report 25% improvement in mid back & R UE pain to improve QOL. Baseline: Thoracic back pain up to 5/10, R upper arm pain up to 4-5/10 Goal status: PARTIALLY MET - 11/29/23 - pain typically only momentary with certain movements now and does not seem to linger ~20-25% improvement   LONG TERM GOALS:  Target date: 01/03/2024  Patient will be independent with ongoing/advanced HEP for self-management at home.  Baseline:  Goal status: IN PROGRESS  2.  Patient will report 50-75% improvement in low back and R UE pain to improve QOL.  Baseline:  Thoracic back pain up to 5/10, R upper arm pain up to 4-5/10 Goal status: IN PROGRESS  3.  Patient to demonstrate awareness of osteoporosis precautions as well as ability to achieve and maintain good spinal  alignment/posturing and body mechanics needed for daily activities. Baseline:  Goal status: MET - 11/22/23  4.  Patient will demonstrate functional pain free B shoulder ROM to perform ADLs.   Baseline: Refer to above UE ROM table Goal status: IN PROGRESS - 11/27/23 - B shoulders improving in most planes except R ABD still limited by pain  5  Patient will demonstrate improved B shoulder and scapular strength to >/= 4+/5 for functional UE use Baseline: Refer to above UE MMT table Goal status: IN PROGRESS   6.  Patient will demonstrate improved B LE strength to >/= 4+/5 for improved stability and ease of mobility. Baseline: Refer to above LE MMT table Goal status: IN PROGRESS  7.  Patient will report </= 22% on QuickDASH (MCID = 10 pts) to demonstrate improved functional UE use with decreased pain interference. Baseline: 31.8 / 100 = 31.8 % Goal status: IN PROGRESS - 12/03/25 - 31.8 / 100 = 31.8 %  8. Patient will report </= 18% on Modified Oswestry (MCID = 12%) to demonstrate improved functional ability with decreased pain interference. Baseline: 15 / 50 = 30.0 % Goal status: IN PROGRESS - 12/04/23 - 12 / 50 = 24.0 %   PLAN:  PT FREQUENCY: 2x/week  PT DURATION: 8 weeks  PLANNED INTERVENTIONS: 97164- PT Re-evaluation, 97110-Therapeutic exercises, 97530- Therapeutic activity, 97112- Neuromuscular re-education, 97535- Self Care, 16109- Manual therapy, [id number]- Gait training, [phone]- Aquatic Therapy, 97014- Electrical stimulation (unattended), [phone]- Electrical stimulation (manual), 97016- Vasopneumatic device, [id number]- Ultrasound, 91478- Ionotophoresis 4mg /ml Dexamethasone, Patient/Family education, Balance training, Stair training, Taping, Dry Needling, Joint mobilization, Spinal mobilization, Cryotherapy, and Moist heat  PLAN FOR NEXT SESSION: Gently progress postural stretching and strengthening as well as core and proximal LE (hip and quad/VMO) strengthening; review & update HEP PRN; MT +/-  TPDN to address abnormal muscle tension restricting posture and R shoulder ROM; modalities as needed for pain management; review education on osteoporosis precautions as well as proper posture and body mechanics PRN   Marry Guan, PT 12/04/2023, 1:06 PM     Date of referral: 10/30/2023 Referring provider: Bradd Canary, MD Referring diagnosis? R53.1 (ICD-10-CM) - Weakness  (Osteoporosis, falls and weakness) Treatment diagnosis? (if different than referring diagnosis)  Muscle weakness (generalized)  Pain in thoracic spine  Pain of right upper arm  Stiffness of right shoulder, not elsewhere classified  Stiffness of left shoulder, not elsewhere classified  What was this (referring dx) caused by? Ongoing Issue and Other: Weakness related to osteoporosis with T8 compression fracture  Ashby Dawes of Condition: Chronic (continuous duration > 3 months)   Laterality: Both  Current Functional Measure Score: Back Index 12 / 50 = 24.0 %  & QuickDASH 31.8 / 100 = 31.8 %  Objective measurements identify impairments when they are compared to normal values, the uninvolved extremity, and prior level of function.  [x]  Yes  []  No  Objective assessment of functional ability: Moderate functional limitations   Briefly describe symptoms: Thoracic back pain from history of T8 compression fracture 1  year ago.  She also notes recent onset of R upper arm pain originating around the time she underwent lumpectomy and axillary node dissection for breast cancer. Current deficits include pain as described above, postural abnormalities, core and postural weakness with limited proximal UE ROM and strength as well as proximal LE weakness.    How did symptoms start: Sudden onset for thoracic back pain at time of T8 compression fracture on 11/13/2022.  Gradual onset for R upper arm pain.  Weakness as a sequelae to both.  Average pain intensity:  Last 24 hours: 5/10  Past week: 3/10  How often does the pt  experience symptoms? Intermittently  How much have the symptoms interfered with usual daily activities? A little bit  How has condition changed since care began at this facility? A little better  In general, how is the patients overall health? Good  Onset date: 10/2022 - T8 compression fracture; 08/2023 - R upper arm pain & L lumpectomy and axillary node dissection secondary to breast cancer   BACK PAIN (STarT Back Screening Tool) - (When applicable):  Has your back pain spread down your leg(s) at sometime in the last 2 weeks? []  Yes   [x]  No Have you had pain in the shoulder or neck at sometime in the past 2 weeks? [x]  Yes   []  No Have you only walked short distances because of your back pain? []  Yes   [x]  No In the past 2 weeks, have you dressed more slowly than usual because of your back pain? []  Yes   [x]  No Do you think it is not really safe for person with a condition like yours to be physically active? []  Yes   [x]  No Have worrying thoughts been going through your mind a lot of the time? []  Yes   [x]  No Do you feel that your back pain is terrible and it is never going to get any better? []  Yes   [x]  No In general, have you stopped enjoying all the things you usually enjoy? []  Yes   [x]  No Overall, how bothersome has your back pain been in the last 2 weeks? []  Not at all   []  Slightly     [x]  Moderate   []  Very much     []  Extremely

## 2023-12-06 ENCOUNTER — Encounter: Payer: Self-pay | Admitting: Physical Therapy

## 2023-12-06 ENCOUNTER — Ambulatory Visit: Payer: Medicare Other | Admitting: Physical Therapy

## 2023-12-06 DIAGNOSIS — M25612 Stiffness of left shoulder, not elsewhere classified: Secondary | ICD-10-CM

## 2023-12-06 DIAGNOSIS — M25611 Stiffness of right shoulder, not elsewhere classified: Secondary | ICD-10-CM

## 2023-12-06 DIAGNOSIS — M546 Pain in thoracic spine: Secondary | ICD-10-CM

## 2023-12-06 DIAGNOSIS — M6281 Muscle weakness (generalized): Secondary | ICD-10-CM | POA: Diagnosis not present

## 2023-12-06 DIAGNOSIS — M79621 Pain in right upper arm: Secondary | ICD-10-CM

## 2023-12-06 NOTE — Therapy (Addendum)
OUTPATIENT PHYSICAL THERAPY TREATMENT   Patient Name: Yolanda Hernandez MRN: [id number] DOB:1956-10-08, 68 y.o., female Today's Date: 12/06/2023  END OF SESSION:  PT End of Session - 12/06/23 1019     Visit Number 8    Date for PT Re-Evaluation 01/03/24    Authorization Type [employer] Medicare    Authorization Time Period 12/05/23 - 01/02/24    Authorization - Visit Number 1    Authorization - Number of Visits 6    PT Start Time 1019    PT Stop Time 1059    PT Time Calculation (min) 40 min    Activity Tolerance Patient tolerated treatment well;Patient limited by pain    Behavior During Therapy [facility] for tasks assessed/performed                   Past Medical History:  Diagnosis Date   Cerumen impaction    chronic   Cervical cancer screening 03/12/2015   Menarche at 10 Regular and moderate flow  history of abnormal pap in past, bx and cryo once many years ago, normal since. Last 2011 and normal G0P0, s/p No history of abnormal MGM Last in 2015, no breast concerns  No concerns today Biopsy, cervical: gyn surgeries Menopause at 53 ish   Conflict between patient and family 03/26/2021   Dysmenorrhea    Heart murmur 03/26/2021   Hematuria 07/31/2017   HPV in female 2019   Hyperglycemia 03/19/2013   Hypertension    Muscle cramping 03/26/2021   Osteopenia    Osteoporosis 04/11/2010   Qualifier: Diagnosis of  By: Nena Jordan    Other and unspecified hyperlipidemia 03/19/2013   Preventative health care 09/08/2011   Right calf pain 03/22/2021   Skin cancer    Skin cancer 07/14/2016   Uterine fibroid    Past Surgical History:  Procedure Laterality Date   BREAST SURGERY     biopsy left   COLPOSCOPY     GYNECOLOGIC CRYOSURGERY     MYRINGOPLASTY  1972   TONSILLECTOMY     Patient Active Problem List   Diagnosis Date Noted   Breast cancer, left (HCC) 10/30/2023   Weakness 03/20/2023   Closed wedge compression fracture of T8 vertebra (HCC) 03/20/2023   Chronic  bilateral thoracic back pain 03/20/2023   Abnormal posture 03/20/2023   Thoracic compression fracture (HCC) 11/13/2022   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 04/24/2022   History of COVID-19 03/23/2022   Aortic insufficiency 06/08/2021   Ascending aortic aneurysm (HCC) 06/08/2021   Hypertension 06/07/2021   Conflict between patient and family 03/26/2021   Heart murmur 03/26/2021   Muscle cramping 03/26/2021   Right calf pain 03/22/2021   Excessive cerumen in right ear canal 01/14/2018   HPV in female 2019   Hematuria 07/31/2017   Asymmetric SNHL (sensorineural hearing loss) 11/29/2015   Subjective tinnitus of left ear 11/29/2015   Sensorineural hearing loss (SNHL), bilateral 11/29/2015   Cervical cancer screening 03/12/2015   Contact dermatitis 07/07/2013   Hyperlipidemia, mixed 03/19/2013   Hyperglycemia 03/19/2013   Osteopenia 03/19/2013   Preventative health care 09/08/2011   Osteoporosis 04/11/2010   FIBROIDS, UTERUS 03/07/2010   ELEVATED BLOOD PRESSURE WITHOUT DIAGNOSIS OF HYPERTENSION 03/07/2010   SKIN CANCER, HX OF 03/07/2010    PCP: Bradd Canary, MD   REFERRING PROVIDER: Bradd Canary, MD   REFERRING DIAG: R53.1 (ICD-10-CM) - Weakness  (Osteoporosis, falls and weakness)  THERAPY DIAG:  Muscle weakness (generalized)  Pain in thoracic  spine  Pain of right upper arm  Stiffness of right shoulder, not elsewhere classified  Stiffness of left shoulder, not elsewhere classified  RATIONALE FOR EVALUATION AND TREATMENT: Rehabilitation  ONSET DATE: 10/2022 - T8 compression fracture; 08/2023 - R upper arm pain & L lumpectomy and axillary node dissection secondary to breast cancer  NEXT MD VISIT: 05/01/24   SUBJECTIVE:   SUBJECTIVE STATEMENT: Pt denies pain currently but still notes difficulty with reaching overhead - had difficulty getting something off an upper shelf in her pantry this morning.  EVAL: Pt reports h/o T8 compression fracture ~1 yr ago  (11/13/22) while participating in a gym based exercise program focusing on osteoporosis (she describes standing in slight squat while lifting a resistance machine in front of her when she felt a pop in her mid back).  She was initially told it was a muscle strain and only later identified as a compression fracture on x-ray.  She did complete an episode of PT following this with some benefit noted but later when trying to 3 reduce the exercises at home started to notice limitation due to R upper arm pain.  She has been walking and doing some weightbearing exercises but has not progressed her strengthening and would like to do so.  In November she was diagnosed with breast cancer and underwent a lumpectomy and axillary node dissection on the left for which she was prescribed a HEP to prevent lymphedema.  She she continues to note some pain in her R upper arm over the past few months which limits her ability to reach with her R UE, perform her lymphedema exercises or sleep on her R side.  PAIN: Are you having pain? No and Yes: NPRS scale: 0/10  Pain location: mid back (~T8) Pain description: stiff, achy, uncomfortable Aggravating factors: cleaning (scrubbing the tub, vacuuming, esp when she does a lot in a day), otherwise unpredictable Relieving factors: heating pad, Tylenol  Are you having pain? No and Yes: NPRS scale: 0/10  Pain location: R lateral upper arm/shoulder Pain description: quick onset, pulling Aggravating factors: laying on R side, overhead shoulder ER (pec stretch), end range OH ROM Relieving factors: stopping triggering movement  PERTINENT HISTORY: T8 compression fracture 10/2022, osteoporosis, chronic B thoracic back pain, HTN, muscle cramping, L breast cancer, aortic insufficiency, ascending aortic aneurysm, B sensorineural hearing loss  PRECAUTIONS: Other: Osteoporosis with h/o thoracic compression fx  HAND DOMINANCE: Right  RED FLAGS: None  WEIGHT BEARING RESTRICTIONS:  No  FALLS:  Has patient fallen in last 6 months? 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 To get rid of the arm pain and minimize the back pain. To stay active."   OBJECTIVE: (objective measures completed at initial evaluation unless otherwise dated)  DIAGNOSTIC FINDINGS:  06/20/23 - MR thoracic spine: IMPRESSION: 1. No acute osseous injury of the thoracic spine. 2. Chronic T8 vertebral body compression fracture with 50% height loss anteriorly.  04/18/23 - XR thoracic spine: T8 compression fracture with anterior height loss.   No significant change in  height loss since films on 12/12/2022. No other  fracture seen.  No spondylolisthesis seen.   PATIENT SURVEYS:  ABC scale 1390 / 1600 = 86.9 %, > 80% indicates high level of physical functioning Modified Oswestry 15 / 50 = 30.0 %, indicates moderate disability  Quick Dash 31.8 / 100 = 31.8 %  12/04/23: Modified Oswestry: 12 / 50 = 24.0 % QuickDASH: 31.8 / 100 = 31.8 %  COGNITION: Overall cognitive status: Within functional limits for tasks assessed     SENSATION: WFL  POSTURE:  rounded shoulders, forward head, and increased thoracic kyphosis  CERVICAL ROM:  Grossly WFL/WNL  UPPER EXTREMITY ROM:   Active ROM Right eval Left eval R 11/27/23 L 11/27/23  Shoulder flexion 120 124 128 129  Shoulder extension 40 ^ 46 50 50  Shoulder abduction 142 ^ 138 114 ^ 136  Shoulder adduction      Shoulder internal rotation FIR L3 ^ FIR T12 FIR L1 FIR T12  Shoulder external rotation  FER T3 ^ FER T3 FER T5 FER T5  (Blank rows = not tested, ^ - increased pain)  UPPER EXTREMITY MMT:  MMT Right eval Left eval  Shoulder flexion 4 4  Shoulder extension 4 4  Shoulder abduction 4 4  Shoulder adduction    Shoulder internal rotation 4+ 4+  Shoulder external rotation 4 4  Middle trapezius 4- 4-  Lower trapezius Unable Unable  Elbow flexion    Elbow extension    Wrist flexion    Wrist extension    Wrist ulnar deviation    Wrist radial deviation    Wrist pronation    Wrist supination    Grip strength (lbs)    (Blank rows = not tested)  MUSCLE LENGTH: Hamstrings: Mild tight B ITB: WFL Piriformis: Mild tight B Hip flexors: WFL Quads: Mild tight B  LOWER EXTREMITY ROM: Grossly WFL/WNL  LOWER EXTREMITY MMT:  MMT Right eval Left eval  Hip flexion 4- 4-  Hip extension 4+ 4+  Hip abduction 4- 4-  Hip adduction 4- 4-  Hip internal rotation 4+ 4+  Hip external rotation 3+ 3+  Knee flexion 5 5  Knee extension 5 5  Ankle dorsiflexion 5 5  Ankle plantarflexion    Ankle inversion    Ankle eversion     (Blank rows = not tested)  BED MOBILITY:  Independent  TRANSFERS: Assistive device utilized: None  Sit to stand: Complete Independence Stand to sit: Complete Independence Chair to chair: Complete Independence Floor:  NT  GAIT: Distance walked: Clinic distances Assistive device utilized: None Level of assistance: Complete Independence Gait pattern: WFL   TODAY'S TREATMENT:  12/06/2023  THERAPEUTIC EXERCISE: To improve strength and endurance.  Demonstration, verbal and tactile cues throughout for technique.  NuStep - L5 x 6 min (UE/LE)  MANUAL THERAPY: To promote normalized muscle tension, improved flexibility, improved joint mobility, increased ROM, and reduced pain. Trigger Point Dry Needling: Treatment instructions/education: Subsequent Treatment: Instructions provided previously at initial dry needling treatment.  Instructions reviewed, if  requested by the patient, prior to subsequent dry needling treatment.  Education Handout Provided: Previously Provided Consent: Patient Verbal Consent Given: Yes Treatment: Muscles Treated: R anterior & lateral deltoid Skilled palpation and monitoring of soft tissue during DN Electrical Stimulation Performed: No Treatment Response/Outcome: Twitch Response Elicited, Palpable Increase in Muscle Length, but increased muscle spasm STM/DTM, manual TPR and pin & stretch to muscles addressed with DN  NEUROMUSCULAR RE-EDUCATION: To improve kinesthesia and posture. Kinesiotaping for deltoid  inhibition and to promote improved posture and scapular engagement - 3 Y strips - 1st from lateral shoulder along spine of scapula, 2nd from distal to deltoid wrapping ant & post to top of shoulder, 3rd from pecs to lateral shoulder   12/04/2023 THERAPEUTIC ACTIVITIES: To improve functional performance.  Demonstration, verbal and tactile cues throughout for technique. Modified Oswestry: 12 / 50 = 24.0 % QuickDASH: 31.8 / 100 = 31.8 %  THERAPEUTIC EXERCISE: To improve strength and endurance.  Demonstration, verbal and tactile cues throughout for technique.  Rec Bike - L5 x 6 min BATCA lat pulldown 5# x 10 BATCA seated row 15# x 10 - performed w/o chest pad to increase core/abdominal muscle engagement BATCA chest press 5# x 10 - cues to avoid rounding away from seat back   NEUROMUSCULAR RE-EDUCATION: To improve coordination, kinesthesia, and posture. Serratus roll up on foam roller on wall x 10 Serratus wall slide with arms in pillowcase x 10   11/29/2023 THERAPEUTIC EXERCISE: To improve strength and endurance.  Demonstration, verbal and tactile cues throughout for technique.  NuStep - L5 x 6 min (UE/LE) Wall push-up x 10 Wall push-up plus x 10  Demonstrated hip ADD ball squeeze + single LE LAQ to promote increased VMO activation as pt noting intermittent L infrapatellar pain with lunging motions or stair  negotiation (not attempted)  NEUROMUSCULAR RE-EDUCATION: To improve coordination, kinesthetic sense, and posture.  Lower trap setting with UE wall slide x 10 Prone over orange Pball with knees on Airex pad: I's x 10 T's x 10 - increased discomfort in L shoulder Y's x 3 - increased discomfort in R shoulder - deferred W's x 10 Hooklying TrA + sequential march x 10 Hooklying TrA + dead bug alt UE flexion/LE extension x 10  MANUAL THERAPY: To promote normalized muscle tension, improved flexibility, improved joint mobility, increased ROM, and reduced pain utilizing therapeutic massage and manual TP therapy. STM/DTM and manual TPR to L lateral deltoid, teres group and subscapularis   PATIENT EDUCATION:  Education details: role of DN, DN rational, procedure, outcomes, potential side effects, and recommended post-treatment exercises/activity, and Ktape wearing and removal instructions Person educated: Patient Education method: Explanation and Handouts Education comprehension: verbalized understanding and returned demonstration  HOME EXERCISE PROGRAM: Access Code: [email or web address] URL: [email or web address] Date: 12/06/2023 Prepared by: Glenetta Hew  Exercises - Doorway Pec Stretch at 60 Degrees Abduction with Arm Straight  - 2 x daily - 7 x weekly - 3 reps - 30 sec hold - Seated Thoracic Lumbar Extension  - 1-2 x daily - 7 x weekly - 2 sets - 10 reps - 3 sec hold - Supine Shoulder External Rotation with Dowel  - 1-2 x daily - 7 x weekly - 2 sets - 10 reps - 3 sec hold - Supine Shoulder Flexion AAROM with Dowel  - 1-2 x daily - 7 x weekly - 2 sets - 10 reps - 3 sec hold - Supine Shoulder Protraction with Dowel  - 1-2 x daily - 7 x weekly - 2 sets - 10 reps - 3 sec hold - Shoulder Flexion Wall Slide with Towel  - 1-2 x daily - 7 x weekly - 2 sets - 10 reps - 3 sec hold - Supine Shoulder Horizontal Abduction with Resistance  - 1 x daily - 3-4 x weekly - 2 sets - 10 reps - 3 sec hold -  Hooklying Single Leg Bent Knee Fallouts with Resistance  - 1 x daily - 3-4 x weekly -  2 sets - 10 reps - 3 sec hold - Supine March with Resistance Band  - 1 x daily - 3-4 x weekly - 2 sets - 10 reps - 2-3 sec hold hold - Supine Hip Adduction Isometric with Ball  - 2 x daily - 3-4 x weekly - 2 sets - 10 reps - 5 sec hold - Seated Bilateral Shoulder External Rotation with Resistance  - 1 x daily - 3-4 x weekly - 2 sets - 10 reps - 3 sec hold - Standing Bilateral Low Shoulder Row with Anchored Resistance  - 1 x daily - 3-4 x weekly - 2 sets - 10 reps - 5 sec hold - Scapular Retraction with Resistance Advanced  - 1 x daily - 3-4 x weekly - 2 sets - 10 reps - 5 sec hold - Low Trap Setting at Wall  - 1 x daily - 3-4 x weekly - 2 sets - 10 reps - 3 sec hold  Patient Education - Do it right & prevent fractures - Osteoporosis education - Posture and Body Mechanics - Trigger Point Dry Needling - Kinesiology tape   ASSESSMENT:  CLINICAL IMPRESSION: Maddelyn continues to note difficulty and increases pain/tightness in her R upper arm with reaching overhead.  Several taut tender bands and TPs still present in deltoids consistent with where she localizes the pain.  Attempted to address these taut bands/TPs with MT incorporating TPDN but patient having much stronger response to DN than on previous attempts with increased pain, therefore shifted focus to just manual STM/DTM and TPR.  Able to alleviate most of the increased pain from the DN but abnormal muscle tension still evident, therefore concluded session with application of kinesiotape to facilitate deltoid inhibition and to promote improved posture and scapular engagement.  Tangy continues to demonstrate progress toward her PT goals and will benefit from continued skilled PT to address ongoing deficits and impairments to improve mobility and activity tolerance while safely progressing strengthening with decreased pain interference.   OBJECTIVE IMPAIRMENTS:  decreased activity tolerance, decreased knowledge of condition, difficulty walking, decreased ROM, decreased strength, decreased safety awareness, hypomobility, increased fascial restrictions, impaired perceived functional ability, increased muscle spasms, impaired flexibility, impaired UE functional use, improper body mechanics, postural dysfunction, and pain.   ACTIVITY LIMITATIONS: carrying, lifting, bending, sitting, standing, reach over head, locomotion level, and caring for others  PARTICIPATION LIMITATIONS: meal prep, cleaning, laundry, driving, shopping, community activity, and yard work  PERSONAL FACTORS: Past/current experiences, Time since onset of injury/illness/exacerbation, and 3+ comorbidities: T8 compression fracture 10/2022, osteoporosis, chronic B thoracic back pain, HTN, muscle cramping, L breast cancer, aortic insufficiency, ascending aortic aneurysm, B sensorineural hearing loss  are also affecting patient's functional outcome.   REHAB POTENTIAL: Excellent  CLINICAL DECISION MAKING: Evolving/moderate complexity  EVALUATION COMPLEXITY: Moderate   GOALS: Goals reviewed with patient? Yes  SHORT TERM GOALS: Target date: 12/06/2023   Patient will be independent with initial HEP. Baseline: HEP initiated on eval Goal status: MET - 11/27/23   2.  Patient will report 25% improvement in mid back & R UE pain to improve QOL. Baseline: Thoracic back pain up to 5/10, R upper arm pain up to 4-5/10 Goal status: PARTIALLY MET - 11/29/23 - pain typically only momentary with certain movements now and does not seem to linger ~20-25% improvement   LONG TERM GOALS: Target date: 01/03/2024  Patient will be independent with ongoing/advanced HEP for self-management at home.  Baseline:  Goal status: IN PROGRESS  2.  Patient will  report 50-75% improvement in low back and R UE pain to improve QOL.  Baseline:  Thoracic back pain up to 5/10, R upper arm pain up to 4-5/10 Goal status: IN  PROGRESS  3.  Patient to demonstrate awareness of osteoporosis precautions as well as ability to achieve and maintain good spinal alignment/posturing and body mechanics needed for daily activities. Baseline:  Goal status: MET - 11/22/23  4.  Patient will demonstrate functional pain free B shoulder ROM to perform ADLs.   Baseline: Refer to above UE ROM table Goal status: IN PROGRESS - 11/27/23 - B shoulders improving in most planes except R ABD still limited by pain  5  Patient will demonstrate improved B shoulder and scapular strength to >/= 4+/5 for functional UE use Baseline: Refer to above UE MMT table Goal status: IN PROGRESS   6.  Patient will demonstrate improved B LE strength to >/= 4+/5 for improved stability and ease of mobility. Baseline: Refer to above LE MMT table Goal status: IN PROGRESS  7.  Patient will report </= 22% on QuickDASH (MCID = 10 pts) to demonstrate improved functional UE use with decreased pain interference. Baseline: 31.8 / 100 = 31.8 % Goal status: IN PROGRESS - 12/03/25 - 31.8 / 100 = 31.8 %  8. Patient will report </= 18% on Modified Oswestry (MCID = 12%) to demonstrate improved functional ability with decreased pain interference. Baseline: 15 / 50 = 30.0 % Goal status: IN PROGRESS - 12/04/23 - 12 / 50 = 24.0 %   PLAN:  PT FREQUENCY: 2x/week  PT DURATION: 8 weeks  PLANNED INTERVENTIONS: 97164- PT Re-evaluation, 97110-Therapeutic exercises, 97530- Therapeutic activity, 97112- Neuromuscular re-education, 97535- Self Care, 21308- Manual therapy, [id number]- Gait training, [phone]- Aquatic Therapy, 97014- Electrical stimulation (unattended), [phone]- Electrical stimulation (manual), 97016- Vasopneumatic device, [id number]- Ultrasound, 52841- Ionotophoresis 4mg /ml Dexamethasone, Patient/Family education, Balance training, Stair training, Taping, Dry Needling, Joint mobilization, Spinal mobilization, Cryotherapy, and Moist heat  PLAN FOR NEXT SESSION: Gently progress  postural stretching and strengthening as well as core and proximal LE (hip and quad/VMO) strengthening; review & update HEP PRN; MT +/- TPDN to address abnormal muscle tension restricting posture and R shoulder ROM; modalities as needed for pain management; review education on osteoporosis precautions as well as proper posture and body mechanics PRN   Marry Guan, PT 12/06/2023, 11:26 AM     Date of referral: 10/30/2023 Referring provider: Bradd Canary, MD Referring diagnosis? R53.1 (ICD-10-CM) - Weakness  (Osteoporosis, falls and weakness) Treatment diagnosis? (if different than referring diagnosis)  Muscle weakness (generalized)  Pain in thoracic spine  Pain of right upper arm  Stiffness of right shoulder, not elsewhere classified  Stiffness of left shoulder, not elsewhere classified  What was this (referring dx) caused by? Ongoing Issue and Other: Weakness related to osteoporosis with T8 compression fracture  Ashby Dawes of Condition: Chronic (continuous duration > 3 months)   Laterality: Both  Current Functional Measure Score: Back Index 12 / 50 = 24.0 %  & QuickDASH 31.8 / 100 = 31.8 %  Objective measurements identify impairments when they are compared to normal values, the uninvolved extremity, and prior level of function.  [x]  Yes  []  No  Objective assessment of functional ability: Moderate functional limitations   Briefly describe symptoms: Thoracic back pain from history of T8 compression fracture 1 year ago.  She also notes recent onset of R upper arm pain originating around the time she underwent lumpectomy and axillary node dissection for breast cancer.  Current deficits include pain as described above, postural abnormalities, core and postural weakness with limited proximal UE ROM and strength as well as proximal LE weakness.    How did symptoms start: Sudden onset for thoracic back pain at time of T8 compression fracture on 11/13/2022.  Gradual onset for R upper arm  pain.  Weakness as a sequelae to both.  Average pain intensity:  Last 24 hours: 5/10  Past week: 3/10  How often does the pt experience symptoms? Intermittently  How much have the symptoms interfered with usual daily activities? A little bit  How has condition changed since care began at this facility? A little better  In general, how is the patients overall health? Good  Onset date: 10/2022 - T8 compression fracture; 08/2023 - R upper arm pain & L lumpectomy and axillary node dissection secondary to breast cancer   BACK PAIN (STarT Back Screening Tool) - (When applicable):  Has your back pain spread down your leg(s) at sometime in the last 2 weeks? []  Yes   [x]  No Have you had pain in the shoulder or neck at sometime in the past 2 weeks? [x]  Yes   []  No Have you only walked short distances because of your back pain? []  Yes   [x]  No In the past 2 weeks, have you dressed more slowly than usual because of your back pain? []  Yes   [x]  No Do you think it is not really safe for person with a condition like yours to be physically active? []  Yes   [x]  No Have worrying thoughts been going through your mind a lot of the time? []  Yes   [x]  No Do you feel that your back pain is terrible and it is never going to get any better? []  Yes   [x]  No In general, have you stopped enjoying all the things you usually enjoy? []  Yes   [x]  No Overall, how bothersome has your back pain been in the last 2 weeks? []  Not at all   []  Slightly     [x]  Moderate   []  Very much     []  Extremely

## 2023-12-11 ENCOUNTER — Ambulatory Visit: Payer: Medicare Other | Admitting: Physical Therapy

## 2023-12-11 ENCOUNTER — Encounter: Payer: Self-pay | Admitting: Physical Therapy

## 2023-12-11 DIAGNOSIS — M6281 Muscle weakness (generalized): Secondary | ICD-10-CM | POA: Diagnosis not present

## 2023-12-11 DIAGNOSIS — M79621 Pain in right upper arm: Secondary | ICD-10-CM

## 2023-12-11 DIAGNOSIS — M25611 Stiffness of right shoulder, not elsewhere classified: Secondary | ICD-10-CM

## 2023-12-11 DIAGNOSIS — M546 Pain in thoracic spine: Secondary | ICD-10-CM

## 2023-12-11 DIAGNOSIS — M25612 Stiffness of left shoulder, not elsewhere classified: Secondary | ICD-10-CM

## 2023-12-11 NOTE — Therapy (Signed)
 OUTPATIENT PHYSICAL THERAPY TREATMENT  Progress Note  Reporting Period 11/08/2023 to 12/11/2023   See note below for Objective Data and Assessment of Progress/Goals.     Patient Name: Yolanda Hernandez MRN: [id number] DOB:12/06/1955, 68 y.o., female Today's Date: 12/11/2023  END OF SESSION:  PT End of Session - 12/11/23 1149     Visit Number 9    Date for PT Re-Evaluation 01/03/24    Authorization Type [employer]    Authorization Time Period 12/05/23 - 01/02/24    Authorization - Visit Number 2    Authorization - Number of Visits 6    Progress Note Due on Visit 10    PT Start Time 1149    PT Stop Time 1243    PT Time Calculation (min) 54 min    Activity Tolerance Patient tolerated treatment well    Behavior During Therapy [facility] for tasks assessed/performed                    Past Medical History:  Diagnosis Date   Cerumen impaction    chronic   Cervical cancer screening 03/12/2015   Menarche at 10 Regular and moderate flow  history of abnormal pap in past, bx and cryo once many years ago, normal since. Last 2011 and normal G0P0, s/p No history of abnormal MGM Last in 2015, no breast concerns  No concerns today Biopsy, cervical: gyn surgeries Menopause at 53 ish   Conflict between patient and family 03/26/2021   Dysmenorrhea    Heart murmur 03/26/2021   Hematuria 07/31/2017   HPV in female 2019   Hyperglycemia 03/19/2013   Hypertension    Muscle cramping 03/26/2021   Osteopenia    Osteoporosis 04/11/2010   Qualifier: Diagnosis of  By: Nena Jordan    Other and unspecified hyperlipidemia 03/19/2013   Preventative health care 09/08/2011   Right calf pain 03/22/2021   Skin cancer    Skin cancer 07/14/2016   Uterine fibroid    Past Surgical History:  Procedure Laterality Date   BREAST SURGERY     biopsy left   COLPOSCOPY     GYNECOLOGIC CRYOSURGERY     MYRINGOPLASTY  1972   TONSILLECTOMY     Patient Active Problem List   Diagnosis Date Noted    Breast cancer, left (HCC) 10/30/2023   Weakness 03/20/2023   Closed wedge compression fracture of T8 vertebra (HCC) 03/20/2023   Chronic bilateral thoracic back pain 03/20/2023   Abnormal posture 03/20/2023   Thoracic compression fracture (HCC) 11/13/2022   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 04/24/2022   History of COVID-19 03/23/2022   Aortic insufficiency 06/08/2021   Ascending aortic aneurysm (HCC) 06/08/2021   Hypertension 06/07/2021   Conflict between patient and family 03/26/2021   Heart murmur 03/26/2021   Muscle cramping 03/26/2021   Right calf pain 03/22/2021   Excessive cerumen in right ear canal 01/14/2018   HPV in female 2019   Hematuria 07/31/2017   Asymmetric SNHL (sensorineural hearing loss) 11/29/2015   Subjective tinnitus of left ear 11/29/2015   Sensorineural hearing loss (SNHL), bilateral 11/29/2015   Cervical cancer screening 03/12/2015   Contact dermatitis 07/07/2013   Hyperlipidemia, mixed 03/19/2013   Hyperglycemia 03/19/2013   Osteopenia 03/19/2013   Preventative health care 09/08/2011   Osteoporosis 04/11/2010   FIBROIDS, UTERUS 03/07/2010   ELEVATED BLOOD PRESSURE WITHOUT DIAGNOSIS OF HYPERTENSION 03/07/2010   SKIN CANCER, HX OF 03/07/2010    PCP: Bradd Canary, MD  REFERRING PROVIDER: Bradd Canary, MD   REFERRING DIAG: R53.1 (ICD-10-CM) - Weakness  (Osteoporosis, falls and weakness)  THERAPY DIAG:  Muscle weakness (generalized)  Pain in thoracic spine  Pain of right upper arm  Stiffness of right shoulder, not elsewhere classified  Stiffness of left shoulder, not elsewhere classified  RATIONALE FOR EVALUATION AND TREATMENT: Rehabilitation  ONSET DATE: 10/2022 - T8 compression fracture; 08/2023 - R upper arm pain & L lumpectomy and axillary node dissection secondary to breast cancer  NEXT MD VISIT: 05/01/24   SUBJECTIVE:   SUBJECTIVE STATEMENT: Pt denies pain currently and states she went yesterday w/o her back  hurting all day.  She states she felt like the taping helped her increase her ROM before she felt discomfort as well as less soreness after the exercises.  EVAL: Pt reports h/o T8 compression fracture ~1 yr ago (11/13/22) while participating in a gym based exercise program focusing on osteoporosis (she describes standing in slight squat while lifting a resistance machine in front of her when she felt a pop in her mid back).  She was initially told it was a muscle strain and only later identified as a compression fracture on x-ray.  She did complete an episode of PT following this with some benefit noted but later when trying to 3 reduce the exercises at home started to notice limitation due to R upper arm pain.  She has been walking and doing some weightbearing exercises but has not progressed her strengthening and would like to do so.  In November she was diagnosed with breast cancer and underwent a lumpectomy and axillary node dissection on the left for which she was prescribed a HEP to prevent lymphedema.  She she continues to note some pain in her R upper arm over the past few months which limits her ability to reach with her R UE, perform her lymphedema exercises or sleep on her R side.  PAIN: Are you having pain? No and Yes: NPRS scale: 0/10  Pain location: mid back (~T8) Pain description: stiff, achy, uncomfortable Aggravating factors: cleaning (scrubbing the tub, vacuuming, esp when she does a lot in a day), otherwise unpredictable Relieving factors: heating pad, Tylenol  Are you having pain? No and Yes: NPRS scale: 0/10  Pain location: R lateral upper arm/shoulder Pain description: quick onset, pulling Aggravating factors: laying on R side, overhead shoulder ER (pec stretch), end range OH ROM Relieving factors: stopping triggering movement  PERTINENT HISTORY: T8 compression fracture 10/2022, osteoporosis, chronic B thoracic back pain, HTN, muscle cramping, L breast cancer, aortic  insufficiency, ascending aortic aneurysm, B sensorineural hearing loss  PRECAUTIONS: Other: Osteoporosis with h/o thoracic compression fx  HAND DOMINANCE: Right  RED FLAGS: None  WEIGHT BEARING RESTRICTIONS: No  FALLS:  Has patient fallen in last 6 months? 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 To get rid of the arm pain and minimize the back pain. To stay active."   OBJECTIVE: (objective measures completed at initial evaluation unless otherwise dated)  DIAGNOSTIC FINDINGS:  06/20/23 -  MR thoracic spine: IMPRESSION: 1. No acute osseous injury of the thoracic spine. 2. Chronic T8 vertebral body compression fracture with 50% height loss anteriorly.  04/18/23 - XR thoracic spine: T8 compression fracture with anterior height loss.   No significant change in height loss since films on 12/12/2022. No other  fracture seen.  No spondylolisthesis seen.   PATIENT SURVEYS:  ABC scale 1390 / 1600 = 86.9 %, > 80% indicates high level of physical functioning Modified Oswestry 15 / 50 = 30.0 %, indicates moderate disability  Quick Dash 31.8 / 100 = 31.8 %  12/04/23: Modified Oswestry: 12 / 50 = 24.0 % QuickDASH: 31.8 / 100 = 31.8 %  COGNITION: Overall cognitive status: Within functional limits for tasks assessed     SENSATION: WFL  POSTURE:  rounded shoulders, forward head, and increased thoracic kyphosis  CERVICAL ROM:  Grossly WFL/WNL  UPPER EXTREMITY ROM:   Active ROM Right eval Left eval R 11/27/23 L 11/27/23  Shoulder  flexion 120 124 128 129  Shoulder extension 40 ^ 46 50 50  Shoulder abduction 142 ^ 138 114 ^ 136  Shoulder adduction      Shoulder internal rotation FIR L3 ^ FIR T12 FIR L1 FIR T12  Shoulder external rotation FER T3 ^ FER T3 FER T5 FER T5  (Blank rows = not tested, ^ - increased pain)  UPPER EXTREMITY MMT:  MMT Right eval Left eval R 12/11/23 L 12/11/23  Shoulder flexion 4 4 4+ 4+  Shoulder extension 4 4 4+ 4  Shoulder abduction 4 4 4+ 4+ *  Shoulder adduction      Shoulder internal rotation 4+ 4+ 4+ 4+  Shoulder external rotation 4 4 4 4   Middle trapezius 4- 4-    Lower trapezius Unable Unable    Elbow flexion      Elbow extension      Wrist flexion      Wrist extension      Wrist ulnar deviation      Wrist radial deviation      Wrist pronation      Wrist supination      Grip strength (lbs)      (Blank rows = not tested, * - discomfort)  MUSCLE LENGTH: Hamstrings: Mild tight B ITB: WFL Piriformis: Mild tight B Hip flexors: WFL Quads: Mild tight B  LOWER EXTREMITY ROM: Grossly WFL/WNL  LOWER EXTREMITY MMT:  MMT Right eval Left eval R 12/11/23 L 12/11/23  Hip flexion 4- 4- 4+ 4  Hip extension 4+ 4+ 4+ 4+  Hip abduction 4- 4- 4 4+  Hip adduction 4- 4- 4- 4  Hip internal rotation 4+ 4+ 4+ 4+  Hip external rotation 3+ 3+ 4 4-  Knee flexion 5 5    Knee extension 5 5    Ankle dorsiflexion 5 5    Ankle plantarflexion      Ankle inversion      Ankle eversion       (Blank rows = not tested)  BED MOBILITY:  Independent  TRANSFERS: Assistive device utilized: None  Sit to stand: Complete Independence Stand to sit: Complete Independence Chair to chair: Complete Independence Floor:  NT  GAIT: Distance walked: Clinic distances Assistive device utilized: None Level of assistance: Complete Independence Gait pattern: WFL   TODAY'S TREATMENT:  12/11/2023  THERAPEUTIC EXERCISE: To improve strength and endurance.  Demonstration, verbal and tactile cues throughout  for technique.  NuStep - L5 x 6 min (UE/LE) Hooklying TrA + blue  TB bent knee fallout 10 x 3" Hooklying TrA + blue TB hip flexion march 10 x 3" Hooklying B scap retraction + RTB horiz ABD - clarified tension on band   UE/LE MMT  NEUROMUSCULAR RE-EDUCATION: To improve kinesthesia, posture, and proprioception.  Quadruped over peanut ball  Alt UE raises x 10 Alt LE raises x 10 Tall kneel hip hinges x 10 Child's pose with 3-way orange Pball rollout x 10 for paraspinal stretch and increased shoulder flexion  SELF CARE: Provided education  on safe exercise options to reduce risk for osteoporotic fracture .  Per patient inquiry, discussed benefit of aquatic PT utilizing the buoyancy and hydrostatic pressure of water for support and to offload joints along with the viscosity of the water for resistance of strengthening and water current perturbations to provide challenge to standing balance for increased core activation.  Patient states she would feel more comfortable having formal instruction in appropriate exercises before attempting aquatic exercises on her own, therefore will plan to schedule a few aquatic visits as part of current POC to help her develop an HEP that she could use at one of the [facility] pools.   12/06/2023  THERAPEUTIC EXERCISE: To improve strength and endurance.  Demonstration, verbal and tactile cues throughout for technique.  NuStep - L5 x 6 min (UE/LE)  MANUAL THERAPY: To promote normalized muscle tension, improved flexibility, improved joint mobility, increased ROM, and reduced pain. Trigger Point Dry Needling: Treatment instructions/education: Subsequent Treatment: Instructions provided previously at initial dry needling treatment.  Instructions reviewed, if requested by the patient, prior to subsequent dry needling treatment.  Education Handout Provided: Previously Provided Consent: Patient Verbal Consent Given: Yes Treatment: Muscles Treated: R anterior & lateral  deltoid Skilled palpation and monitoring of soft tissue during DN Electrical Stimulation Performed: No Treatment Response/Outcome: Twitch Response Elicited, Palpable Increase in Muscle Length, but increased muscle spasm STM/DTM, manual TPR and pin & stretch to muscles addressed with DN  NEUROMUSCULAR RE-EDUCATION: To improve kinesthesia and posture. Kinesiotaping for deltoid inhibition and to promote improved posture and scapular engagement - 3 Y strips - 1st from lateral shoulder along spine of scapula, 2nd from distal to deltoid wrapping ant & post to top of shoulder, 3rd from pecs to lateral shoulder   12/04/2023 THERAPEUTIC ACTIVITIES: To improve functional performance.  Demonstration, verbal and tactile cues throughout for technique. Modified Oswestry: 12 / 50 = 24.0 % QuickDASH: 31.8 / 100 = 31.8 %  THERAPEUTIC EXERCISE: To improve strength and endurance.  Demonstration, verbal and tactile cues throughout for technique.  Rec Bike - L5 x 6 min BATCA lat pulldown 5# x 10 BATCA seated row 15# x 10 - performed w/o chest pad to increase core/abdominal muscle engagement BATCA chest press 5# x 10 - cues to avoid rounding away from seat back   NEUROMUSCULAR RE-EDUCATION: To improve coordination, kinesthesia, and posture. Serratus roll up on foam roller on wall x 10 Serratus wall slide with arms in pillowcase x 10   PATIENT EDUCATION:  Education details: progress with PT and ongoing PT POC Person educated: Patient Education method: Explanation Education comprehension: verbalized understanding  HOME EXERCISE PROGRAM: Access Code: [email or web address] URL: [email or web address] Date: 12/06/2023 Prepared by: Glenetta Hew  Exercises - Doorway Pec Stretch at 60 Degrees Abduction with Arm Straight  - 2 x daily - 7 x weekly - 3 reps - 30 sec hold - Seated Thoracic Lumbar Extension  - 1-2 x daily - 7 x weekly - 2 sets - 10 reps - 3  sec hold - Supine Shoulder External Rotation with  Dowel  - 1-2 x daily - 7 x weekly - 2 sets - 10 reps - 3 sec hold - Supine Shoulder Flexion AAROM with Dowel  - 1-2 x daily - 7 x weekly - 2 sets - 10 reps - 3 sec hold - Supine Shoulder Protraction with Dowel  - 1-2 x daily - 7 x weekly - 2 sets - 10 reps - 3 sec hold - Shoulder Flexion Wall Slide with Towel  - 1-2 x daily - 7 x weekly - 2 sets - 10 reps - 3 sec hold - Supine Shoulder Horizontal Abduction with Resistance  - 1 x daily - 3-4 x weekly - 2 sets - 10 reps - 3 sec hold - Hooklying Single Leg Bent Knee Fallouts with Resistance  - 1 x daily - 3-4 x weekly - 2 sets - 10 reps - 3 sec hold - Supine March with Resistance Band  - 1 x daily - 3-4 x weekly - 2 sets - 10 reps - 2-3 sec hold hold - Supine Hip Adduction Isometric with Ball  - 2 x daily - 3-4 x weekly - 2 sets - 10 reps - 5 sec hold - Seated Bilateral Shoulder External Rotation with Resistance  - 1 x daily - 3-4 x weekly - 2 sets - 10 reps - 3 sec hold - Standing Bilateral Low Shoulder Row with Anchored Resistance  - 1 x daily - 3-4 x weekly - 2 sets - 10 reps - 5 sec hold - Scapular Retraction with Resistance Advanced  - 1 x daily - 3-4 x weekly - 2 sets - 10 reps - 5 sec hold - Low Trap Setting at Wall  - 1 x daily - 3-4 x weekly - 2 sets - 10 reps - 3 sec hold  Patient Education - Do it right & prevent fractures - Osteoporosis education - Posture and Body Mechanics - Trigger Point Dry Needling - Kinesiology tape   ASSESSMENT:  CLINICAL IMPRESSION: Jarae reports she went the full day yesterday without back pain, however only notes overall reduction in back pain by 10-15% since start of PT.  Back pain continues to create difficulty with picking up things positioned low to the ground or household cleaning such as cleaning shower, however she is starting to note decreasing fear of movement as compared to initially when she first suffered the compression fracture.  R upper arm pain improved by 15-20% with pain typically only  momentarily with certain movements and resolving upon ceasing of triggering movements.  Some benefit noted from kinesiotaping last visit however she noted difficulty with tape removal.  She continues to note difficulty with reaching to the back of the refrigerator or a high shelf due to R upper arm pain, but has noted improving ability to put on a shirt.  She was able to tolerate progression of resistance to blue TB with core/lumbopelvic strengthening.  Introduced core stability/strengthening in quadruped over peanut ball to facilitate increased use of trunk extensors with good tolerance for LE movement however limited with UE raises due to R upper arm pain.  Perrie inquired about possibility of aquatic exercises as a good way to help her further strengthen without putting strain on her back but would feel more comfortable having formal instruction in appropriate exercises before attempting to join an aquatic exercise class, therefore we will try to schedule her for a few aquatic therapy visits to help her establish a good  aquatic-based HEP appropriate to her deficits and limitations.  Safaa continues to demonstrate progress toward her PT goals and will benefit from continued skilled PT to address ongoing deficits and impairments to improve mobility and activity tolerance while safely progressing strengthening with decreased pain interference.   OBJECTIVE IMPAIRMENTS: decreased activity tolerance, decreased knowledge of condition, difficulty walking, decreased ROM, decreased strength, decreased safety awareness, hypomobility, increased fascial restrictions, impaired perceived functional ability, increased muscle spasms, impaired flexibility, impaired UE functional use, improper body mechanics, postural dysfunction, and pain.   ACTIVITY LIMITATIONS: carrying, lifting, bending, sitting, standing, reach over head, locomotion level, and caring for others  PARTICIPATION LIMITATIONS: meal prep, cleaning, laundry,  driving, shopping, community activity, and yard work  PERSONAL FACTORS: Past/current experiences, Time since onset of injury/illness/exacerbation, and 3+ comorbidities: T8 compression fracture 10/2022, osteoporosis, chronic B thoracic back pain, HTN, muscle cramping, L breast cancer, aortic insufficiency, ascending aortic aneurysm, B sensorineural hearing loss  are also affecting patient's functional outcome.   REHAB POTENTIAL: Excellent  CLINICAL DECISION MAKING: Evolving/moderate complexity  EVALUATION COMPLEXITY: Moderate   GOALS: Goals reviewed with patient? Yes  SHORT TERM GOALS: Target date: 12/06/2023   Patient will be independent with initial HEP. Baseline: HEP initiated on eval Goal status: MET - 11/27/23   2.  Patient will report 25% improvement in mid back & R UE pain to improve QOL. Baseline: Thoracic back pain up to 5/10, R upper arm pain up to 4-5/10 Goal status: PARTIALLY MET - 12/11/23 - back pain 10-15% improved but still more likely to persist when present; R upper arm pain 15-20% improved with pain typically only momentary with certain movements now and does not seem to linger   LONG TERM GOALS: Target date: 01/03/2024  Patient will be independent with ongoing/advanced HEP for self-management at home.  Baseline:  Goal status: IN PROGRESS - 12/11/23 - met for current HEP  2.  Patient will report 50-75% improvement in low back and R UE pain to improve QOL.  Baseline:  Thoracic back pain up to 5/10, R upper arm pain up to 4-5/10 Goal status: IN PROGRESS - 12/11/23 - back pain 10-15% improved but still more likely to persist when present; R upper arm pain 15-20% improved with pain typically only momentary with certain movements now and does not seem to linger   3.  Patient to demonstrate awareness of osteoporosis precautions as well as ability to achieve and maintain good spinal alignment/posturing and body mechanics needed for daily activities. Baseline:  Goal status: MET  - 11/22/23  4.  Patient will demonstrate functional pain free B shoulder ROM to perform ADLs.   Baseline: Refer to above UE ROM table Goal status: IN PROGRESS - 11/27/23 - B shoulders improving in most planes except R ABD still limited by pain  5  Patient will demonstrate improved B shoulder and scapular strength to >/= 4+/5 for functional UE use Baseline: Refer to above UE MMT table Goal status: PARTIALLY MET - 12/11/23 - overall improved but L slightly weaker than R  6.  Patient will demonstrate improved B LE strength to >/= 4+/5 for improved stability and ease of mobility. Baseline: Refer to above LE MMT table Goal status: PARTIALLY MET - 12/11/23 - overall improved but L slightly weaker than R  7.  Patient will report </= 22% on QuickDASH (MCID = 10 pts) to demonstrate improved functional UE use with decreased pain interference. Baseline: 31.8 / 100 = 31.8 % Goal status: IN PROGRESS - 12/03/25 - 31.8 /  100 = 31.8 %  8. Patient will report </= 18% on Modified Oswestry (MCID = 12%) to demonstrate improved functional ability with decreased pain interference. Baseline: 15 / 50 = 30.0 % Goal status: IN PROGRESS - 12/04/23 - 12 / 50 = 24.0 %   PLAN:  PT FREQUENCY: 2x/week  PT DURATION: 8 weeks  PLANNED INTERVENTIONS: 97164- PT Re-evaluation, 97110-Therapeutic exercises, 97530- Therapeutic activity, 97112- Neuromuscular re-education, 97535- Self Care, 62952- Manual therapy, [id number]- Gait training, [phone]- Aquatic Therapy, 97014- Electrical stimulation (unattended), [phone]- Electrical stimulation (manual), 97016- Vasopneumatic device, [id number]- Ultrasound, 27253- Ionotophoresis 4mg /ml Dexamethasone, Patient/Family education, Balance training, Stair training, Taping, Dry Needling, Joint mobilization, Spinal mobilization, Cryotherapy, and Moist heat  PLAN FOR NEXT SESSION: Gently progress postural stretching and strengthening as well as core and proximal LE (hip and quad/VMO) strengthening; review &  update HEP PRN; MT +/- TPDN to address abnormal muscle tension restricting posture and R shoulder ROM; modalities as needed for pain management; review education on osteoporosis precautions as well as proper posture and body mechanics PRN   Marry Guan, PT 12/11/2023, 8:25 PM     Date of referral: 10/30/2023 Referring provider: Bradd Canary, MD Referring diagnosis? R53.1 (ICD-10-CM) - Weakness  (Osteoporosis, falls and weakness) Treatment diagnosis? (if different than referring diagnosis)  Muscle weakness (generalized)  Pain in thoracic spine  Pain of right upper arm  Stiffness of right shoulder, not elsewhere classified  Stiffness of left shoulder, not elsewhere classified  What was this (referring dx) caused by? Ongoing Issue and Other: Weakness related to osteoporosis with T8 compression fracture  Ashby Dawes of Condition: Chronic (continuous duration > 3 months)   Laterality: Both  Current Functional Measure Score: Back Index 12 / 50 = 24.0 %  & QuickDASH 31.8 / 100 = 31.8 %  Objective measurements identify impairments when they are compared to normal values, the uninvolved extremity, and prior level of function.  [x]  Yes  []  No  Objective assessment of functional ability: Moderate functional limitations   Briefly describe symptoms: Thoracic back pain from history of T8 compression fracture 1 year ago.  She also notes recent onset of R upper arm pain originating around the time she underwent lumpectomy and axillary node dissection for breast cancer. Current deficits include pain as described above, postural abnormalities, core and postural weakness with limited proximal UE ROM and strength as well as proximal LE weakness.    How did symptoms start: Sudden onset for thoracic back pain at time of T8 compression fracture on 11/13/2022.  Gradual onset for R upper arm pain.  Weakness as a sequelae to both.  Average pain intensity:  Last 24 hours: 5/10  Past week: 3/10  How  often does the pt experience symptoms? Intermittently  How much have the symptoms interfered with usual daily activities? A little bit  How has condition changed since care began at this facility? A little better  In general, how is the patients overall health? Good  Onset date: 10/2022 - T8 compression fracture; 08/2023 - R upper arm pain & L lumpectomy and axillary node dissection secondary to breast cancer   BACK PAIN (STarT Back Screening Tool) - (When applicable):  Has your back pain spread down your leg(s) at sometime in the last 2 weeks? []  Yes   [x]  No Have you had pain in the shoulder or neck at sometime in the past 2 weeks? [x]  Yes   []  No Have you only walked short distances because of your back pain? []   Yes   [x]  No In the past 2 weeks, have you dressed more slowly than usual because of your back pain? []  Yes   [x]  No Do you think it is not really safe for person with a condition like yours to be physically active? []  Yes   [x]  No Have worrying thoughts been going through your mind a lot of the time? []  Yes   [x]  No Do you feel that your back pain is terrible and it is never going to get any better? []  Yes   [x]  No In general, have you stopped enjoying all the things you usually enjoy? []  Yes   [x]  No Overall, how bothersome has your back pain been in the last 2 weeks? []  Not at all   []  Slightly     [x]  Moderate   []  Very much     []  Extremely

## 2023-12-13 ENCOUNTER — Ambulatory Visit: Payer: Medicare Other | Admitting: Physical Therapy

## 2023-12-13 DIAGNOSIS — M79621 Pain in right upper arm: Secondary | ICD-10-CM

## 2023-12-13 DIAGNOSIS — M25611 Stiffness of right shoulder, not elsewhere classified: Secondary | ICD-10-CM

## 2023-12-13 DIAGNOSIS — M6281 Muscle weakness (generalized): Secondary | ICD-10-CM

## 2023-12-13 DIAGNOSIS — M546 Pain in thoracic spine: Secondary | ICD-10-CM

## 2023-12-13 DIAGNOSIS — M25612 Stiffness of left shoulder, not elsewhere classified: Secondary | ICD-10-CM

## 2023-12-13 NOTE — Therapy (Signed)
 OUTPATIENT PHYSICAL THERAPY TREATMENT    Patient Name: Yolanda Hernandez MRN: [id number] DOB:[birth year], 68 y.o., female Today's Date: 12/13/2023  END OF SESSION:  PT End of Session - 12/13/23 1152     Visit Number 10    Date for PT Re-Evaluation 01/03/24    Authorization Type [employer]    Authorization Time Period 12/05/23 - 01/02/24    Authorization - Visit Number 3    Authorization - Number of Visits 6    Progress Note Due on Visit 19   PN on visit #9 - 12/11/23   PT Start Time 1152    PT Stop Time 1237    PT Time Calculation (min) 45 min    Activity Tolerance Patient tolerated treatment well    Behavior During Therapy [facility] for tasks assessed/performed                     Past Medical History:  Diagnosis Date   Cerumen impaction    chronic   Cervical cancer screening 03/12/2015   Menarche at 10 Regular and moderate flow  history of abnormal pap in past, bx and cryo once many years ago, normal since. Last 2011 and normal G0P0, s/p No history of abnormal MGM Last in 2015, no breast concerns  No concerns today Biopsy, cervical: gyn surgeries Menopause at 53 ish   Conflict between patient and family 03/26/2021   Dysmenorrhea    Heart murmur 03/26/2021   Hematuria 07/31/2017   HPV in female 2019   Hyperglycemia 03/19/2013   Hypertension    Muscle cramping 03/26/2021   Osteopenia    Osteoporosis 04/11/2010   Qualifier: Diagnosis of  By: Nena Jordan    Other and unspecified hyperlipidemia 03/19/2013   Preventative health care 09/08/2011   Right calf pain 03/22/2021   Skin cancer    Skin cancer 07/14/2016   Uterine fibroid    Past Surgical History:  Procedure Laterality Date   BREAST SURGERY     biopsy left   COLPOSCOPY     GYNECOLOGIC CRYOSURGERY     MYRINGOPLASTY  1972   TONSILLECTOMY     Patient Active Problem List   Diagnosis Date Noted   Breast cancer, left (HCC) 10/30/2023   Weakness 03/20/2023   Closed wedge compression fracture of T8  vertebra (HCC) 03/20/2023   Chronic bilateral thoracic back pain 03/20/2023   Abnormal posture 03/20/2023   Thoracic compression fracture (HCC) 11/13/2022   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 04/24/2022   History of COVID-19 03/23/2022   Aortic insufficiency 06/08/2021   Ascending aortic aneurysm (HCC) 06/08/2021   Hypertension 06/07/2021   Conflict between patient and family 03/26/2021   Heart murmur 03/26/2021   Muscle cramping 03/26/2021   Right calf pain 03/22/2021   Excessive cerumen in right ear canal 01/14/2018   HPV in female 2019   Hematuria 07/31/2017   Asymmetric SNHL (sensorineural hearing loss) 11/29/2015   Subjective tinnitus of left ear 11/29/2015   Sensorineural hearing loss (SNHL), bilateral 11/29/2015   Cervical cancer screening 03/12/2015   Contact dermatitis 07/07/2013   Hyperlipidemia, mixed 03/19/2013   Hyperglycemia 03/19/2013   Osteopenia 03/19/2013   Preventative health care 09/08/2011   Osteoporosis 04/11/2010   FIBROIDS, UTERUS 03/07/2010   ELEVATED BLOOD PRESSURE WITHOUT DIAGNOSIS OF HYPERTENSION 03/07/2010   SKIN CANCER, HX OF 03/07/2010    PCP: Bradd Canary, MD   REFERRING PROVIDER: Bradd Canary, MD   REFERRING DIAG: R53.1 (ICD-10-CM) - Weakness  (  Osteoporosis, falls and weakness)  THERAPY DIAG:  Muscle weakness (generalized)  Pain in thoracic spine  Pain of right upper arm  Stiffness of right shoulder, not elsewhere classified  Stiffness of left shoulder, not elsewhere classified  RATIONALE FOR EVALUATION AND TREATMENT: Rehabilitation  ONSET DATE: 10/2022 - T8 compression fracture; 08/2023 - R upper arm pain & L lumpectomy and axillary node dissection secondary to breast cancer  NEXT MD VISIT: 05/01/24   SUBJECTIVE:   SUBJECTIVE STATEMENT: Pt reports the [employer] has an open house on Saturday - she plans to go over a check it out including the pool.  No pain today and she states she has not had any back pian for 3  days.  EVAL: Pt reports h/o T8 compression fracture ~1 yr ago (11/13/22) while participating in a gym based exercise program focusing on osteoporosis (she describes standing in slight squat while lifting a resistance machine in front of her when she felt a pop in her mid back).  She was initially told it was a muscle strain and only later identified as a compression fracture on x-ray.  She did complete an episode of PT following this with some benefit noted but later when trying to 3 reduce the exercises at home started to notice limitation due to R upper arm pain.  She has been walking and doing some weightbearing exercises but has not progressed her strengthening and would like to do so.  In November she was diagnosed with breast cancer and underwent a lumpectomy and axillary node dissection on the left for which she was prescribed a HEP to prevent lymphedema.  She she continues to note some pain in her R upper arm over the past few months which limits her ability to reach with her R UE, perform her lymphedema exercises or sleep on her R side.  PAIN: Are you having pain? No and Yes: NPRS scale: 0/10  Pain location: mid back (~T8) Pain description: stiff, achy, uncomfortable Aggravating factors: cleaning (scrubbing the tub, vacuuming, esp when she does a lot in a day), otherwise unpredictable Relieving factors: heating pad, Tylenol  Are you having pain? No and Yes: NPRS scale: 0/10  Pain location: R lateral upper arm/shoulder Pain description: quick onset, pulling Aggravating factors: laying on R side, overhead shoulder ER (pec stretch), end range OH ROM Relieving factors: stopping triggering movement  PERTINENT HISTORY: T8 compression fracture 10/2022, osteoporosis, chronic B thoracic back pain, HTN, muscle cramping, L breast cancer, aortic insufficiency, ascending aortic aneurysm, B sensorineural hearing loss  PRECAUTIONS: Other: Osteoporosis with h/o thoracic compression fx  HAND DOMINANCE:  Right  RED FLAGS: None  WEIGHT BEARING RESTRICTIONS: No  FALLS:  Has patient fallen in last 6 months? 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 To get rid of the arm pain and minimize the back pain. To stay active."   OBJECTIVE: (objective measures completed at initial evaluation unless otherwise dated)  DIAGNOSTIC FINDINGS:  06/20/23 - MR thoracic spine: IMPRESSION: 1. No acute osseous injury of the thoracic spine. 2.  Chronic T8 vertebral body compression fracture with 50% height loss anteriorly.  04/18/23 - XR thoracic spine: T8 compression fracture with anterior height loss.   No significant change in height loss since films on 12/12/2022. No other  fracture seen.  No spondylolisthesis seen.   PATIENT SURVEYS:  ABC scale 1390 / 1600 = 86.9 %, > 80% indicates high level of physical functioning Modified Oswestry 15 / 50 = 30.0 %, indicates moderate disability  Quick Dash 31.8 / 100 = 31.8 %  12/04/23: Modified Oswestry: 12 / 50 = 24.0 % QuickDASH: 31.8 / 100 = 31.8 %  COGNITION: Overall cognitive status: Within functional limits for tasks assessed     SENSATION: WFL  POSTURE:  rounded shoulders, forward head, and increased thoracic kyphosis  CERVICAL ROM:  Grossly WFL/WNL  UPPER EXTREMITY ROM:   Active ROM Right eval Left eval R 11/27/23 L 11/27/23  Shoulder flexion 120 124 128 129  Shoulder extension 40 ^ 46 50 50  Shoulder abduction 142 ^ 138 114 ^ 136  Shoulder adduction      Shoulder internal rotation FIR  L3 ^ FIR T12 FIR L1 FIR T12  Shoulder external rotation FER T3 ^ FER T3 FER T5 FER T5  (Blank rows = not tested, ^ - increased pain)  UPPER EXTREMITY MMT:  MMT Right eval Left eval R 12/11/23 L 12/11/23  Shoulder flexion 4 4 4+ 4+  Shoulder extension 4 4 4+ 4  Shoulder abduction 4 4 4+ 4+ *  Shoulder adduction      Shoulder internal rotation 4+ 4+ 4+ 4+  Shoulder external rotation 4 4 4 4   Middle trapezius 4- 4-    Lower trapezius Unable Unable    Elbow flexion      Elbow extension      Wrist flexion      Wrist extension      Wrist ulnar deviation      Wrist radial deviation      Wrist pronation      Wrist supination      Grip strength (lbs)      (Blank rows = not tested, * - discomfort)  MUSCLE LENGTH: Hamstrings: Mild tight B ITB: WFL Piriformis: Mild tight B Hip flexors: WFL Quads: Mild tight B  LOWER EXTREMITY ROM: Grossly WFL/WNL  LOWER EXTREMITY MMT:  MMT Right eval Left eval R 12/11/23 L 12/11/23  Hip flexion 4- 4- 4+ 4  Hip extension 4+ 4+ 4+ 4+  Hip abduction 4- 4- 4 4+  Hip adduction 4- 4- 4- 4  Hip internal rotation 4+ 4+ 4+ 4+  Hip external rotation 3+ 3+ 4 4-  Knee flexion 5 5    Knee extension 5 5    Ankle dorsiflexion 5 5    Ankle plantarflexion      Ankle inversion      Ankle eversion       (Blank rows = not tested)  BED MOBILITY:  Independent  TRANSFERS: Assistive device utilized: None  Sit to stand: Complete Independence Stand to sit: Complete Independence Chair to chair: Complete Independence Floor:  NT  GAIT: Distance walked: Clinic distances Assistive device utilized: None Level of assistance: Complete Independence Gait pattern: WFL   TODAY'S TREATMENT:  12/13/2023  THERAPEUTIC EXERCISE: To improve strength and endurance.  Demonstration, verbal and tactile cues throughout for technique.  Rec Bike - L5 x 6 min BATCA seated lat pulldown 5# x 10 BATCA standing lat pulldown 5# x 10 BATCA standing straight arm pulldown  5# x  10 BATCA seated low row 15# x 10 - performed w/o chest pad to increase core/abdominal muscle engagement BATCA seated mid row 15# x 10 - performed w/o chest pad to increase core/abdominal muscle engagement BATCA chest press horiz grip 5# x 10 - cues to avoid rounding away from seat back BATCA chest press vertical grip 5# x 10 - cues to avoid rounding away from seat back  BATCA knee flexion B LE 20# x 10 BATCA knee extension B LE 20# x 10 BATCA leg press B LE 20# x 10 BATCA ankle press B LE 20# x 10   12/11/2023  THERAPEUTIC EXERCISE: To improve strength and endurance.  Demonstration, verbal and tactile cues throughout for technique.  NuStep - L5 x 6 min (UE/LE) Hooklying TrA + blue TB bent knee fallout 10 x 3" Hooklying TrA + blue TB hip flexion march 10 x 3" Hooklying B scap retraction + RTB horiz ABD - clarified tension on band   UE/LE MMT  NEUROMUSCULAR RE-EDUCATION: To improve kinesthesia, posture, and proprioception.  Quadruped over peanut ball  Alt UE raises x 10 Alt LE raises x 10 Tall kneel hip hinges x 10 Child's pose with 3-way orange Pball rollout x 10 for paraspinal stretch and increased shoulder flexion  SELF CARE: Provided education  on safe exercise options to reduce risk for osteoporotic fracture .  Per patient inquiry, discussed benefit of aquatic PT utilizing the buoyancy and hydrostatic pressure of water for support and to offload joints along with the viscosity of the water for resistance of strengthening and water current perturbations to provide challenge to standing balance for increased core activation.  Patient states she would feel more comfortable having formal instruction in appropriate exercises before attempting aquatic exercises on her own, therefore will plan to schedule a few aquatic visits as part of current POC to help her develop an HEP that she could use at one of the [facility] pools.   12/06/2023  THERAPEUTIC EXERCISE: To improve strength and  endurance.  Demonstration, verbal and tactile cues throughout for technique.  NuStep - L5 x 6 min (UE/LE)  MANUAL THERAPY: To promote normalized muscle tension, improved flexibility, improved joint mobility, increased ROM, and reduced pain. Trigger Point Dry Needling: Treatment instructions/education: Subsequent Treatment: Instructions provided previously at initial dry needling treatment.  Instructions reviewed, if requested by the patient, prior to subsequent dry needling treatment.  Education Handout Provided: Previously Provided Consent: Patient Verbal Consent Given: Yes Treatment: Muscles Treated: R anterior & lateral deltoid Skilled palpation and monitoring of soft tissue during DN Electrical Stimulation Performed: No Treatment Response/Outcome: Twitch Response Elicited, Palpable Increase in Muscle Length, but increased muscle spasm STM/DTM, manual TPR and pin & stretch to muscles addressed with DN  NEUROMUSCULAR RE-EDUCATION: To improve kinesthesia and posture. Kinesiotaping for deltoid inhibition and to promote improved posture and scapular engagement - 3 Y strips - 1st from lateral shoulder along spine of scapula, 2nd from distal to deltoid wrapping ant & post to top of shoulder, 3rd from pecs to lateral shoulder   PATIENT EDUCATION:  Education details:  Appropriate use of gym equipment including precautions related to osteoporosis Person educated: Patient Education method: Explanation Education comprehension: verbalized understanding  HOME EXERCISE PROGRAM: Access Code: [email or web address] URL: [email or web address] Date: 12/06/2023 Prepared by: Glenetta Hew  Exercises - Doorway Pec Stretch at 60 Degrees Abduction with Arm Straight  - 2 x daily - 7 x weekly - 3 reps - 30 sec hold - Seated Thoracic Lumbar Extension  -  1-2 x daily - 7 x weekly - 2 sets - 10 reps - 3 sec hold - Supine Shoulder External Rotation with Dowel  - 1-2 x daily - 7 x weekly - 2 sets - 10 reps -  3 sec hold - Supine Shoulder Flexion AAROM with Dowel  - 1-2 x daily - 7 x weekly - 2 sets - 10 reps - 3 sec hold - Supine Shoulder Protraction with Dowel  - 1-2 x daily - 7 x weekly - 2 sets - 10 reps - 3 sec hold - Shoulder Flexion Wall Slide with Towel  - 1-2 x daily - 7 x weekly - 2 sets - 10 reps - 3 sec hold - Supine Shoulder Horizontal Abduction with Resistance  - 1 x daily - 3-4 x weekly - 2 sets - 10 reps - 3 sec hold - Hooklying Single Leg Bent Knee Fallouts with Resistance  - 1 x daily - 3-4 x weekly - 2 sets - 10 reps - 3 sec hold - Supine March with Resistance Band  - 1 x daily - 3-4 x weekly - 2 sets - 10 reps - 2-3 sec hold hold - Supine Hip Adduction Isometric with Ball  - 2 x daily - 3-4 x weekly - 2 sets - 10 reps - 5 sec hold - Seated Bilateral Shoulder External Rotation with Resistance  - 1 x daily - 3-4 x weekly - 2 sets - 10 reps - 3 sec hold - Standing Bilateral Low Shoulder Row with Anchored Resistance  - 1 x daily - 3-4 x weekly - 2 sets - 10 reps - 5 sec hold - Scapular Retraction with Resistance Advanced  - 1 x daily - 3-4 x weekly - 2 sets - 10 reps - 5 sec hold - Low Trap Setting at Wall  - 1 x daily - 3-4 x weekly - 2 sets - 10 reps - 3 sec hold  Patient Education - Do it right & prevent fractures - Osteoporosis education - Posture and Body Mechanics - Trigger Point Dry Needling - Kinesiology tape   ASSESSMENT:  CLINICAL IMPRESSION: Danay reports she has now gone 3 days without back pain.  She plans to go to an open house at the [facility] on Saturday to look into aquatic exercise programs as well as available gym equipment.  She requested to review proper set up and use of gym equipment today, with PT providing cues/instruction for machine set up and weight adjustment, awareness of precautions related to osteoporosis.  Cautioned patient to avoid machines which would encourage excessive forward flexion or create compressive forces along her spine such as overhead  shoulder press.  We have set up 2 aquatic visits to help her establish an aquatic-based HEP for use at the [facility] pool.  Glori will benefit from continued skilled PT to address ongoing deficits and impairments to improve mobility and activity tolerance while safely progressing strengthening with decreased pain interference.   OBJECTIVE IMPAIRMENTS: decreased activity tolerance, decreased knowledge of condition, difficulty walking, decreased ROM, decreased strength, decreased safety awareness, hypomobility, increased fascial restrictions, impaired perceived functional ability, increased muscle spasms, impaired flexibility, impaired UE functional use, improper body mechanics, postural dysfunction, and pain.   ACTIVITY LIMITATIONS: carrying, lifting, bending, sitting, standing, reach over head, locomotion level, and caring for others  PARTICIPATION LIMITATIONS: meal prep, cleaning, laundry, driving, shopping, community activity, and yard work  PERSONAL FACTORS: Past/current experiences, Time since onset of injury/illness/exacerbation, and 3+ comorbidities: T8 compression fracture 10/2022, osteoporosis,  chronic B thoracic back pain, HTN, muscle cramping, L breast cancer, aortic insufficiency, ascending aortic aneurysm, B sensorineural hearing loss  are also affecting patient's functional outcome.   REHAB POTENTIAL: Excellent  CLINICAL DECISION MAKING: Evolving/moderate complexity  EVALUATION COMPLEXITY: Moderate   GOALS: Goals reviewed with patient? Yes  SHORT TERM GOALS: Target date: 12/06/2023   Patient will be independent with initial HEP. Baseline: HEP initiated on eval Goal status: MET - 11/27/23   2.  Patient will report 25% improvement in mid back & R UE pain to improve QOL. Baseline: Thoracic back pain up to 5/10, R upper arm pain up to 4-5/10 Goal status: PARTIALLY MET - 12/11/23 - back pain 10-15% improved but still more likely to persist when present; R upper arm pain 15-20% improved  with pain typically only momentary with certain movements now and does not seem to linger   LONG TERM GOALS: Target date: 01/03/2024  Patient will be independent with ongoing/advanced HEP for self-management at home.  Baseline:  Goal status: IN PROGRESS - 12/11/23 - met for current HEP  2.  Patient will report 50-75% improvement in low back and R UE pain to improve QOL.  Baseline:  Thoracic back pain up to 5/10, R upper arm pain up to 4-5/10 Goal status: IN PROGRESS - 12/11/23 - back pain 10-15% improved but still more likely to persist when present; R upper arm pain 15-20% improved with pain typically only momentary with certain movements now and does not seem to linger   3.  Patient to demonstrate awareness of osteoporosis precautions as well as ability to achieve and maintain good spinal alignment/posturing and body mechanics needed for daily activities. Baseline:  Goal status: MET - 11/22/23  4.  Patient will demonstrate functional pain free B shoulder ROM to perform ADLs.   Baseline: Refer to above UE ROM table Goal status: IN PROGRESS - 11/27/23 - B shoulders improving in most planes except R ABD still limited by pain  5  Patient will demonstrate improved B shoulder and scapular strength to >/= 4+/5 for functional UE use Baseline: Refer to above UE MMT table Goal status: PARTIALLY MET - 12/11/23 - overall improved but L slightly weaker than R  6.  Patient will demonstrate improved B LE strength to >/= 4+/5 for improved stability and ease of mobility. Baseline: Refer to above LE MMT table Goal status: PARTIALLY MET - 12/11/23 - overall improved but L slightly weaker than R  7.  Patient will report </= 22% on QuickDASH (MCID = 10 pts) to demonstrate improved functional UE use with decreased pain interference. Baseline: 31.8 / 100 = 31.8 % Goal status: IN PROGRESS - 12/03/25 - 31.8 / 100 = 31.8 %  8. Patient will report </= 18% on Modified Oswestry (MCID = 12%) to demonstrate improved  functional ability with decreased pain interference. Baseline: 15 / 50 = 30.0 % Goal status: IN PROGRESS - 12/04/23 - 12 / 50 = 24.0 %   PLAN:  PT FREQUENCY: 2x/week  PT DURATION: 8 weeks  PLANNED INTERVENTIONS: 97164- PT Re-evaluation, 97110-Therapeutic exercises, 97530- Therapeutic activity, 97112- Neuromuscular re-education, 97535- Self Care, 66440- Manual therapy, [id number]- Gait training, [id number]- Aquatic Therapy, 97014- Electrical stimulation (unattended), [id number]- Electrical stimulation (manual), 97016- Vasopneumatic device, [id number]- Ultrasound, [id number]- Ionotophoresis 4mg /ml Dexamethasone, Patient/Family education, Balance training, Stair training, Taping, Dry Needling, Joint mobilization, Spinal mobilization, Cryotherapy, and Moist heat  PLAN FOR NEXT SESSION: Gently progress postural stretching and strengthening as well as core and proximal LE (hip and  quad/VMO) strengthening; review & update HEP PRN; MT +/- TPDN to address abnormal muscle tension restricting posture and R shoulder ROM; modalities as needed for pain management; review education on osteoporosis precautions as well as proper posture and body mechanics PRN   Marry Guan, PT 12/13/2023, 7:33 PM     Date of referral: 10/30/2023 Referring provider: Bradd Canary, MD Referring diagnosis? R53.1 (ICD-10-CM) - Weakness  (Osteoporosis, falls and weakness) Treatment diagnosis? (if different than referring diagnosis)  Muscle weakness (generalized)  Pain in thoracic spine  Pain of right upper arm  Stiffness of right shoulder, not elsewhere classified  Stiffness of left shoulder, not elsewhere classified  What was this (referring dx) caused by? Ongoing Issue and Other: Weakness related to osteoporosis with T8 compression fracture  Ashby Dawes of Condition: Chronic (continuous duration > 3 months)   Laterality: Both  Current Functional Measure Score: Back Index 12 / 50 = 24.0 %  & QuickDASH 31.8 / 100 = 31.8 %  Objective  measurements identify impairments when they are compared to normal values, the uninvolved extremity, and prior level of function.  [x]  Yes  []  No  Objective assessment of functional ability: Moderate functional limitations   Briefly describe symptoms: Thoracic back pain from history of T8 compression fracture 1 year ago.  She also notes recent onset of R upper arm pain originating around the time she underwent lumpectomy and axillary node dissection for breast cancer. Current deficits include pain as described above, postural abnormalities, core and postural weakness with limited proximal UE ROM and strength as well as proximal LE weakness.    How did symptoms start: Sudden onset for thoracic back pain at time of T8 compression fracture on 11/13/2022.  Gradual onset for R upper arm pain.  Weakness as a sequelae to both.  Average pain intensity:  Last 24 hours: 5/10  Past week: 3/10  How often does the pt experience symptoms? Intermittently  How much have the symptoms interfered with usual daily activities? A little bit  How has condition changed since care began at this facility? A little better  In general, how is the patients overall health? Good  Onset date: 10/2022 - T8 compression fracture; 08/2023 - R upper arm pain & L lumpectomy and axillary node dissection secondary to breast cancer   BACK PAIN (STarT Back Screening Tool) - (When applicable):  Has your back pain spread down your leg(s) at sometime in the last 2 weeks? []  Yes   [x]  No Have you had pain in the shoulder or neck at sometime in the past 2 weeks? [x]  Yes   []  No Have you only walked short distances because of your back pain? []  Yes   [x]  No In the past 2 weeks, have you dressed more slowly than usual because of your back pain? []  Yes   [x]  No Do you think it is not really safe for person with a condition like yours to be physically active? []  Yes   [x]  No Have worrying thoughts been going through your mind a lot  of the time? []  Yes   [x]  No Do you feel that your back pain is terrible and it is never going to get any better? []  Yes   [x]  No In general, have you stopped enjoying all the things you usually enjoy? []  Yes   [x]  No Overall, how bothersome has your back pain been in the last 2 weeks? []  Not at all   []  Slightly     [  x] Moderate   []  Very much     []  Extremely

## 2023-12-18 ENCOUNTER — Ambulatory Visit: Payer: Medicare Other | Attending: Family Medicine | Admitting: Physical Therapy

## 2023-12-18 ENCOUNTER — Encounter: Payer: Self-pay | Admitting: Physical Therapy

## 2023-12-18 DIAGNOSIS — M546 Pain in thoracic spine: Secondary | ICD-10-CM | POA: Diagnosis present

## 2023-12-18 DIAGNOSIS — M79621 Pain in right upper arm: Secondary | ICD-10-CM | POA: Insufficient documentation

## 2023-12-18 DIAGNOSIS — M25612 Stiffness of left shoulder, not elsewhere classified: Secondary | ICD-10-CM | POA: Insufficient documentation

## 2023-12-18 DIAGNOSIS — M25611 Stiffness of right shoulder, not elsewhere classified: Secondary | ICD-10-CM | POA: Diagnosis present

## 2023-12-18 DIAGNOSIS — M6281 Muscle weakness (generalized): Secondary | ICD-10-CM | POA: Diagnosis present

## 2023-12-18 NOTE — Therapy (Signed)
 OUTPATIENT PHYSICAL THERAPY TREATMENT    Patient Name: Yolanda Hernandez MRN: [id number] DOB:[birth year], 68 y.o., female Today's Date: 12/18/2023  END OF SESSION:  PT End of Session - 12/18/23 1146     Visit Number 11    Date for PT Re-Evaluation 01/03/24    Authorization Type [employer] Medicare    Authorization Time Period 12/05/23 - 01/02/24    Authorization - Visit Number 4    Authorization - Number of Visits 6    Progress Note Due on Visit 19   PN on visit #9 - 12/11/23   PT Start Time 1146    PT Stop Time 1234    PT Time Calculation (min) 48 min    Activity Tolerance Patient tolerated treatment well    Behavior During Therapy [facility] for tasks assessed/performed                      Past Medical History:  Diagnosis Date   Cerumen impaction    chronic   Cervical cancer screening 03/12/2015   Menarche at 10 Regular and moderate flow  history of abnormal pap in past, bx and cryo once many years ago, normal since. Last 2011 and normal G0P0, s/p No history of abnormal MGM Last in 2015, no breast concerns  No concerns today Biopsy, cervical: gyn surgeries Menopause at 53 ish   Conflict between patient and family 03/26/2021   Dysmenorrhea    Heart murmur 03/26/2021   Hematuria 07/31/2017   HPV in female 2019   Hyperglycemia 03/19/2013   Hypertension    Muscle cramping 03/26/2021   Osteopenia    Osteoporosis 04/11/2010   Qualifier: Diagnosis of  By: Nena Jordan    Other and unspecified hyperlipidemia 03/19/2013   Preventative health care 09/08/2011   Right calf pain 03/22/2021   Skin cancer    Skin cancer 07/14/2016   Uterine fibroid    Past Surgical History:  Procedure Laterality Date   BREAST SURGERY     biopsy left   COLPOSCOPY     GYNECOLOGIC CRYOSURGERY     MYRINGOPLASTY  1972   TONSILLECTOMY     Patient Active Problem List   Diagnosis Date Noted   Breast cancer, left (HCC) 10/30/2023   Weakness 03/20/2023   Closed wedge compression fracture of T8  vertebra (HCC) 03/20/2023   Chronic bilateral thoracic back pain 03/20/2023   Abnormal posture 03/20/2023   Thoracic compression fracture (HCC) 11/13/2022   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 04/24/2022   History of COVID-19 03/23/2022   Aortic insufficiency 06/08/2021   Ascending aortic aneurysm (HCC) 06/08/2021   Hypertension 06/07/2021   Conflict between patient and family 03/26/2021   Heart murmur 03/26/2021   Muscle cramping 03/26/2021   Right calf pain 03/22/2021   Excessive cerumen in right ear canal 01/14/2018   HPV in female 2019   Hematuria 07/31/2017   Asymmetric SNHL (sensorineural hearing loss) 11/29/2015   Subjective tinnitus of left ear 11/29/2015   Sensorineural hearing loss (SNHL), bilateral 11/29/2015   Cervical cancer screening 03/12/2015   Contact dermatitis 07/07/2013   Hyperlipidemia, mixed 03/19/2013   Hyperglycemia 03/19/2013   Osteopenia 03/19/2013   Preventative health care 09/08/2011   Osteoporosis 04/11/2010   FIBROIDS, UTERUS 03/07/2010   ELEVATED BLOOD PRESSURE WITHOUT DIAGNOSIS OF HYPERTENSION 03/07/2010   SKIN CANCER, HX OF 03/07/2010    PCP: Bradd Canary, MD   REFERRING PROVIDER: Bradd Canary, MD   REFERRING DIAG: R53.1 (ICD-10-CM) -  Weakness  (Osteoporosis, falls and weakness)  THERAPY DIAG:  Muscle weakness (generalized)  Pain in thoracic spine  Pain of right upper arm  Stiffness of right shoulder, not elsewhere classified  Stiffness of left shoulder, not elsewhere classified  RATIONALE FOR EVALUATION AND TREATMENT: Rehabilitation  ONSET DATE: 10/2022 - T8 compression fracture; 08/2023 - R upper arm pain & L lumpectomy and axillary node dissection secondary to breast cancer  NEXT MD VISIT: 05/01/24   SUBJECTIVE:   SUBJECTIVE STATEMENT: Pt reports she was not able to go the the [facility] open house but will check it out on her own.  EVAL: Pt reports h/o T8 compression fracture ~1 yr ago (11/13/22) while  participating in a gym based exercise program focusing on osteoporosis (she describes standing in slight squat while lifting a resistance machine in front of her when she felt a pop in her mid back).  She was initially told it was a muscle strain and only later identified as a compression fracture on x-ray.  She did complete an episode of PT following this with some benefit noted but later when trying to 3 reduce the exercises at home started to notice limitation due to R upper arm pain.  She has been walking and doing some weightbearing exercises but has not progressed her strengthening and would like to do so.  In November she was diagnosed with breast cancer and underwent a lumpectomy and axillary node dissection on the left for which she was prescribed a HEP to prevent lymphedema.  She she continues to note some pain in her R upper arm over the past few months which limits her ability to reach with her R UE, perform her lymphedema exercises or sleep on her R side.  PAIN: Are you having pain? No and Yes: NPRS scale: 0/10  Pain location: mid back (~T8) Pain description: stiff, achy, uncomfortable Aggravating factors: cleaning (scrubbing the tub, vacuuming, esp when she does a lot in a day), otherwise unpredictable Relieving factors: heating pad, Tylenol  Are you having pain? No and Yes: NPRS scale: 0/10  Pain location: R lateral upper arm/shoulder Pain description: quick onset, pulling Aggravating factors: laying on R side, overhead shoulder ER (pec stretch), end range OH ROM Relieving factors: stopping triggering movement  PERTINENT HISTORY: T8 compression fracture 10/2022, osteoporosis, chronic B thoracic back pain, HTN, muscle cramping, L breast cancer, aortic insufficiency, ascending aortic aneurysm, B sensorineural hearing loss  PRECAUTIONS: Other: Osteoporosis with h/o thoracic compression fx  HAND DOMINANCE: Right  RED FLAGS: None  WEIGHT BEARING RESTRICTIONS: No  FALLS:  Has  patient fallen in last 6 months? 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 To get rid of the arm pain and minimize the back pain. To stay active."   OBJECTIVE: (objective measures completed at initial evaluation unless otherwise dated)  DIAGNOSTIC FINDINGS:  06/20/23 - MR thoracic spine: IMPRESSION: 1. No acute osseous injury of the thoracic spine. 2. Chronic T8 vertebral body compression fracture with 50% height loss anteriorly.  04/18/23 - XR thoracic spine:  T8 compression fracture with anterior height loss.   No significant change in height loss since films on 12/12/2022. No other  fracture seen.  No spondylolisthesis seen.   PATIENT SURVEYS:  ABC scale 1390 / 1600 = 86.9 %, > 80% indicates high level of physical functioning Modified Oswestry 15 / 50 = 30.0 %, indicates moderate disability  Quick Dash 31.8 / 100 = 31.8 %  12/04/23: Modified Oswestry: 12 / 50 = 24.0 % QuickDASH: 31.8 / 100 = 31.8 %  COGNITION: Overall cognitive status: Within functional limits for tasks assessed     SENSATION: WFL  POSTURE:  rounded shoulders, forward head, and increased thoracic kyphosis  CERVICAL ROM:  Grossly WFL/WNL  UPPER EXTREMITY ROM:   Active ROM Right eval Left eval R 11/27/23 L 11/27/23  Shoulder flexion 120 124 128 129  Shoulder extension 40 ^ 46 50 50  Shoulder abduction 142 ^ 138 114 ^ 136  Shoulder adduction      Shoulder internal rotation FIR L3 ^ FIR T12 FIR L1 FIR T12  Shoulder external rotation FER T3 ^ FER T3 FER  T5 FER T5  (Blank rows = not tested, ^ - increased pain)  UPPER EXTREMITY MMT:  MMT Right eval Left eval R 12/11/23 L 12/11/23  Shoulder flexion 4 4 4+ 4+  Shoulder extension 4 4 4+ 4  Shoulder abduction 4 4 4+ 4+ *  Shoulder adduction      Shoulder internal rotation 4+ 4+ 4+ 4+  Shoulder external rotation 4 4 4 4   Middle trapezius 4- 4-    Lower trapezius Unable Unable    Elbow flexion      Elbow extension      Wrist flexion      Wrist extension      Wrist ulnar deviation      Wrist radial deviation      Wrist pronation      Wrist supination      Grip strength (lbs)      (Blank rows = not tested, * - discomfort)  MUSCLE LENGTH: Hamstrings: Mild tight B ITB: WFL Piriformis: Mild tight B Hip flexors: WFL Quads: Mild tight B  LOWER EXTREMITY ROM: Grossly WFL/WNL  LOWER EXTREMITY MMT:  MMT Right eval Left eval R 12/11/23 L 12/11/23  Hip flexion 4- 4- 4+ 4  Hip extension 4+ 4+ 4+ 4+  Hip abduction 4- 4- 4 4+  Hip adduction 4- 4- 4- 4  Hip internal rotation 4+ 4+ 4+ 4+  Hip external rotation 3+ 3+ 4 4-  Knee flexion 5 5    Knee extension 5 5    Ankle dorsiflexion 5 5    Ankle plantarflexion      Ankle inversion      Ankle eversion       (Blank rows = not tested)  BED MOBILITY:  Independent  TRANSFERS: Assistive device utilized: None  Sit to stand: Complete Independence Stand to sit: Complete Independence Chair to chair: Complete Independence Floor:  NT  GAIT: Distance walked: Clinic distances Assistive device utilized: None Level of assistance: Complete Independence Gait pattern: WFL   TODAY'S TREATMENT:  12/18/2023  THERAPEUTIC EXERCISE: To improve strength and endurance.  Demonstration, verbal and tactile cues throughout for technique.  NuStep - L6 x 6 min (UE/LE)  MANUAL THERAPY: To promote normalized muscle tension, improved flexibility, increased ROM, and reduced pain utilizing connective tissue massage, therapeutic massage, manual TP therapy, and  myofascial release.  STM/DTM, XFM, manual TPR and gentle IASTM  to L anterolateral deltoids and biceps Gentle STM and manual TPR to L lateral pecs STM/DTM, XFM, manual TPR and gentle IASTM to R anterior, lateral and posterior deltoids    12/13/2023  THERAPEUTIC EXERCISE: To improve strength and endurance.  Demonstration, verbal and tactile cues throughout for technique.  Rec Bike - L5 x 6 min BATCA seated lat pulldown 5# x 10 BATCA standing lat pulldown 5# x 10 BATCA standing straight arm pulldown 5# x 10 BATCA seated low row 15# x 10 - performed w/o chest pad to increase core/abdominal muscle engagement BATCA seated mid row 15# x 10 - performed w/o chest pad to increase core/abdominal muscle engagement BATCA chest press horiz grip 5# x 10 - cues to avoid rounding away from seat back BATCA chest press vertical grip 5# x 10 - cues to avoid rounding away from seat back  BATCA knee flexion B LE 20# x 10 BATCA knee extension B LE 20# x 10 BATCA leg press B LE 20# x 10 BATCA ankle press B LE 20# x 10   12/11/2023  THERAPEUTIC EXERCISE: To improve strength and endurance.  Demonstration, verbal and tactile cues throughout for technique.  NuStep - L5 x 6 min (UE/LE) Hooklying TrA + blue TB bent knee fallout 10 x 3" Hooklying TrA + blue TB hip flexion march 10 x 3" Hooklying B scap retraction + RTB horiz ABD - clarified tension on band   UE/LE MMT  NEUROMUSCULAR RE-EDUCATION: To improve kinesthesia, posture, and proprioception.  Quadruped over peanut ball  Alt UE raises x 10 Alt LE raises x 10 Tall kneel hip hinges x 10 Child's pose with 3-way orange Pball rollout x 10 for paraspinal stretch and increased shoulder flexion  SELF CARE: Provided education  on safe exercise options to reduce risk for osteoporotic fracture .  Per patient inquiry, discussed benefit of aquatic PT utilizing the buoyancy and hydrostatic pressure of water for support and to offload joints along with the viscosity of  the water for resistance of strengthening and water current perturbations to provide challenge to standing balance for increased core activation.  Patient states she would feel more comfortable having formal instruction in appropriate exercises before attempting aquatic exercises on her own, therefore will plan to schedule a few aquatic visits as part of current POC to help her develop an HEP that she could use at one of the [facility] pools.   PATIENT EDUCATION:  Education details:  Appropriate use of gym equipment including precautions related to osteoporosis Person educated: Patient Education method: Explanation Education comprehension: verbalized understanding  HOME EXERCISE PROGRAM: Access Code: [email or web address] URL: [email or web address] Date: 12/06/2023 Prepared by: Glenetta Hew  Exercises - Doorway Pec Stretch at 60 Degrees Abduction with Arm Straight  - 2 x daily - 7 x weekly - 3 reps - 30 sec hold - Seated Thoracic Lumbar Extension  - 1-2 x daily - 7 x weekly - 2 sets - 10 reps - 3 sec hold - Supine Shoulder External Rotation with Dowel  - 1-2 x daily - 7 x weekly - 2 sets - 10 reps - 3 sec hold - Supine Shoulder Flexion AAROM with Dowel  - 1-2 x daily - 7 x weekly - 2 sets - 10 reps - 3 sec hold - Supine Shoulder Protraction with Dowel  - 1-2 x daily - 7 x weekly - 2 sets - 10 reps - 3 sec hold - Shoulder Flexion Wall Slide with Towel  - 1-2 x daily - 7 x  weekly - 2 sets - 10 reps - 3 sec hold - Supine Shoulder Horizontal Abduction with Resistance  - 1 x daily - 3-4 x weekly - 2 sets - 10 reps - 3 sec hold - Hooklying Single Leg Bent Knee Fallouts with Resistance  - 1 x daily - 3-4 x weekly - 2 sets - 10 reps - 3 sec hold - Supine March with Resistance Band  - 1 x daily - 3-4 x weekly - 2 sets - 10 reps - 2-3 sec hold hold - Supine Hip Adduction Isometric with Ball  - 2 x daily - 3-4 x weekly - 2 sets - 10 reps - 5 sec hold - Seated Bilateral Shoulder External Rotation with  Resistance  - 1 x daily - 3-4 x weekly - 2 sets - 10 reps - 3 sec hold - Standing Bilateral Low Shoulder Row with Anchored Resistance  - 1 x daily - 3-4 x weekly - 2 sets - 10 reps - 5 sec hold - Scapular Retraction with Resistance Advanced  - 1 x daily - 3-4 x weekly - 2 sets - 10 reps - 5 sec hold - Low Trap Setting at Wall  - 1 x daily - 3-4 x weekly - 2 sets - 10 reps - 3 sec hold  Patient Education - Do it right & prevent fractures - Osteoporosis education - Posture and Body Mechanics - Trigger Point Dry Needling - Kinesiology tape   ASSESSMENT:  CLINICAL IMPRESSION: Surah reports she still will experience brief sharp pain when she goes to reach in certain directions with both arms, but the pain is typically short-lived with no pain at rest.  Taut band and TPs identified in both upper arms and were addressed with MT today with good relief of muscle tension although some residual soreness in L lateral pecs.  Her next visit is scheduled as aquatic visit to introduce her to water based exercise and help her establish a HEP for use at the [facility] pool to continue core and UE/LE strengthening with reduced stress on her lumbar compression fracture.  With her next land-based visit we will determine need for recertification vs readiness to transition to her HEP at the end of her current POC.   OBJECTIVE IMPAIRMENTS: decreased activity tolerance, decreased knowledge of condition, difficulty walking, decreased ROM, decreased strength, decreased safety awareness, hypomobility, increased fascial restrictions, impaired perceived functional ability, increased muscle spasms, impaired flexibility, impaired UE functional use, improper body mechanics, postural dysfunction, and pain.   ACTIVITY LIMITATIONS: carrying, lifting, bending, sitting, standing, reach over head, locomotion level, and caring for others  PARTICIPATION LIMITATIONS: meal prep, cleaning, laundry, driving, shopping, community activity,  and yard work  PERSONAL FACTORS: Past/current experiences, Time since onset of injury/illness/exacerbation, and 3+ comorbidities: T8 compression fracture 10/2022, osteoporosis, chronic B thoracic back pain, HTN, muscle cramping, L breast cancer, aortic insufficiency, ascending aortic aneurysm, B sensorineural hearing loss  are also affecting patient's functional outcome.   REHAB POTENTIAL: Excellent  CLINICAL DECISION MAKING: Evolving/moderate complexity  EVALUATION COMPLEXITY: Moderate   GOALS: Goals reviewed with patient? Yes  SHORT TERM GOALS: Target date: 12/06/2023   Patient will be independent with initial HEP. Baseline: HEP initiated on eval Goal status: MET - 11/27/23   2.  Patient will report 25% improvement in mid back & R UE pain to improve QOL. Baseline: Thoracic back pain up to 5/10, R upper arm pain up to 4-5/10 Goal status: PARTIALLY MET - 12/11/23 - back pain 10-15% improved but  still more likely to persist when present; R upper arm pain 15-20% improved with pain typically only momentary with certain movements now and does not seem to linger   LONG TERM GOALS: Target date: 01/03/2024  Patient will be independent with ongoing/advanced HEP for self-management at home.  Baseline:  Goal status: IN PROGRESS - 12/11/23 - met for current HEP  2.  Patient will report 50-75% improvement in low back and R UE pain to improve QOL.  Baseline:  Thoracic back pain up to 5/10, R upper arm pain up to 4-5/10 Goal status: IN PROGRESS - 12/11/23 - back pain 10-15% improved but still more likely to persist when present; R upper arm pain 15-20% improved with pain typically only momentary with certain movements now and does not seem to linger   3.  Patient to demonstrate awareness of osteoporosis precautions as well as ability to achieve and maintain good spinal alignment/posturing and body mechanics needed for daily activities. Baseline:  Goal status: MET - 11/22/23  4.  Patient will  demonstrate functional pain free B shoulder ROM to perform ADLs.   Baseline: Refer to above UE ROM table Goal status: IN PROGRESS - 11/27/23 - B shoulders improving in most planes except R ABD still limited by pain  5  Patient will demonstrate improved B shoulder and scapular strength to >/= 4+/5 for functional UE use Baseline: Refer to above UE MMT table Goal status: PARTIALLY MET - 12/11/23 - overall improved but L slightly weaker than R  6.  Patient will demonstrate improved B LE strength to >/= 4+/5 for improved stability and ease of mobility. Baseline: Refer to above LE MMT table Goal status: PARTIALLY MET - 12/11/23 - overall improved but L slightly weaker than R  7.  Patient will report </= 22% on QuickDASH (MCID = 10 pts) to demonstrate improved functional UE use with decreased pain interference. Baseline: 31.8 / 100 = 31.8 % Goal status: IN PROGRESS - 12/03/25 - 31.8 / 100 = 31.8 %  8. Patient will report </= 18% on Modified Oswestry (MCID = 12%) to demonstrate improved functional ability with decreased pain interference. Baseline: 15 / 50 = 30.0 % Goal status: IN PROGRESS - 12/04/23 - 12 / 50 = 24.0 %   PLAN:  PT FREQUENCY: 2x/week  PT DURATION: 8 weeks  PLANNED INTERVENTIONS: 97164- PT Re-evaluation, 97110-Therapeutic exercises, 97530- Therapeutic activity, 97112- Neuromuscular re-education, 97535- Self Care, [id number]- Manual therapy, [id number]- Gait training, [phone]- Aquatic Therapy, 97014- Electrical stimulation (unattended), [phone]- Electrical stimulation (manual), 97016- Vasopneumatic device, [id number]- Ultrasound, 32440- Ionotophoresis 4mg /ml Dexamethasone, Patient/Family education, Balance training, Stair training, Taping, Dry Needling, Joint mobilization, Spinal mobilization, Cryotherapy, and Moist heat  PLAN FOR NEXT SESSION: Aquatic PT to initiate training in water-based HEP for carryover at [facility] vs [facility]  Gently progress postural stretching and strengthening as well as core and  proximal LE (hip and quad/VMO) strengthening; review & update HEP PRN; MT +/- TPDN to address abnormal muscle tension restricting posture and R shoulder ROM; modalities as needed for pain management; review education on osteoporosis precautions as well as proper posture and body mechanics PRN   Marry Guan, PT 12/18/2023, 3:18 PM     Date of referral: 10/30/2023 Referring provider: Bradd Canary, MD Referring diagnosis? R53.1 (ICD-10-CM) - Weakness  (Osteoporosis, falls and weakness) Treatment diagnosis? (if different than referring diagnosis)  Muscle weakness (generalized)  Pain in thoracic spine  Pain of right upper arm  Stiffness of right shoulder, not elsewhere classified  Stiffness of left shoulder, not elsewhere classified  What was this (referring dx) caused by? Ongoing Issue and Other: Weakness related to osteoporosis with T8 compression fracture  Ashby Dawes of Condition: Chronic (continuous duration > 3 months)   Laterality: Both  Current Functional Measure Score: Back Index 12 / 50 = 24.0 %  & QuickDASH 31.8 / 100 = 31.8 %  Objective measurements identify impairments when they are compared to normal values, the uninvolved extremity, and prior level of function.  [x]  Yes  []  No  Objective assessment of functional ability: Moderate functional limitations   Briefly describe symptoms: Thoracic back pain from history of T8 compression fracture 1 year ago.  She also notes recent onset of R upper arm pain originating around the time she underwent lumpectomy and axillary node dissection for breast cancer. Current deficits include pain as described above, postural abnormalities, core and postural weakness with limited proximal UE ROM and strength as well as proximal LE weakness.    How did symptoms start: Sudden onset for thoracic back pain at time of T8 compression fracture on 11/13/2022.  Gradual onset for R upper arm pain.  Weakness as a sequelae to both.  Average pain  intensity:  Last 24 hours: 5/10  Past week: 3/10  How often does the pt experience symptoms? Intermittently  How much have the symptoms interfered with usual daily activities? A little bit  How has condition changed since care began at this facility? A little better  In general, how is the patients overall health? Good  Onset date: 10/2022 - T8 compression fracture; 08/2023 - R upper arm pain & L lumpectomy and axillary node dissection secondary to breast cancer   BACK PAIN (STarT Back Screening Tool) - (When applicable):  Has your back pain spread down your leg(s) at sometime in the last 2 weeks? []  Yes   [x]  No Have you had pain in the shoulder or neck at sometime in the past 2 weeks? [x]  Yes   []  No Have you only walked short distances because of your back pain? []  Yes   [x]  No In the past 2 weeks, have you dressed more slowly than usual because of your back pain? []  Yes   [x]  No Do you think it is not really safe for person with a condition like yours to be physically active? []  Yes   [x]  No Have worrying thoughts been going through your mind a lot of the time? []  Yes   [x]  No Do you feel that your back pain is terrible and it is never going to get any better? []  Yes   [x]  No In general, have you stopped enjoying all the things you usually enjoy? []  Yes   [x]  No Overall, how bothersome has your back pain been in the last 2 weeks? []  Not at all   []  Slightly     [x]  Moderate   []  Very much     []  Extremely

## 2023-12-24 ENCOUNTER — Encounter (HOSPITAL_BASED_OUTPATIENT_CLINIC_OR_DEPARTMENT_OTHER): Payer: Self-pay | Admitting: Physical Therapy

## 2023-12-24 ENCOUNTER — Ambulatory Visit (HOSPITAL_BASED_OUTPATIENT_CLINIC_OR_DEPARTMENT_OTHER): Payer: Medicare Other | Attending: Family Medicine | Admitting: Physical Therapy

## 2023-12-24 DIAGNOSIS — M25612 Stiffness of left shoulder, not elsewhere classified: Secondary | ICD-10-CM

## 2023-12-24 DIAGNOSIS — M25611 Stiffness of right shoulder, not elsewhere classified: Secondary | ICD-10-CM

## 2023-12-24 DIAGNOSIS — M79621 Pain in right upper arm: Secondary | ICD-10-CM | POA: Diagnosis present

## 2023-12-24 DIAGNOSIS — M546 Pain in thoracic spine: Secondary | ICD-10-CM

## 2023-12-24 DIAGNOSIS — M6281 Muscle weakness (generalized): Secondary | ICD-10-CM | POA: Diagnosis present

## 2023-12-24 NOTE — Therapy (Signed)
 OUTPATIENT PHYSICAL THERAPY TREATMENT    Patient Name: Yolanda Hernandez MRN: [id number] DOB:November 01, 1955, 68 y.o., female Today's Date: 12/24/2023  END OF SESSION:  PT End of Session - 12/24/23 1026     Visit Number 12    Date for PT Re-Evaluation 01/03/24    Authorization Type [employer] Medicare    Authorization Time Period 12/05/23 - 01/02/24    Authorization - Visit Number 5    Authorization - Number of Visits 6    Progress Note Due on Visit 19    PT Start Time 1016    PT Stop Time 1055    PT Time Calculation (min) 39 min    Behavior During Therapy [facility] for tasks assessed/performed            Past Medical History:  Diagnosis Date   Cerumen impaction    chronic   Cervical cancer screening 03/12/2015   Menarche at 10 Regular and moderate flow  history of abnormal pap in past, bx and cryo once many years ago, normal since. Last 2011 and normal G0P0, s/p No history of abnormal MGM Last in 2015, no breast concerns  No concerns today Biopsy, cervical: gyn surgeries Menopause at 53 ish   Conflict between patient and family 03/26/2021   Dysmenorrhea    Heart murmur 03/26/2021   Hematuria 07/31/2017   HPV in female 2019   Hyperglycemia 03/19/2013   Hypertension    Muscle cramping 03/26/2021   Osteopenia    Osteoporosis 04/11/2010   Qualifier: Diagnosis of  By: Nena Jordan    Other and unspecified hyperlipidemia 03/19/2013   Preventative health care 09/08/2011   Right calf pain 03/22/2021   Skin cancer    Skin cancer 07/14/2016   Uterine fibroid    Past Surgical History:  Procedure Laterality Date   BREAST SURGERY     biopsy left   COLPOSCOPY     GYNECOLOGIC CRYOSURGERY     MYRINGOPLASTY  1972   TONSILLECTOMY     Patient Active Problem List   Diagnosis Date Noted   Breast cancer, left (HCC) 10/30/2023   Weakness 03/20/2023   Closed wedge compression fracture of T8 vertebra (HCC) 03/20/2023   Chronic bilateral thoracic back pain 03/20/2023   Abnormal posture  03/20/2023   Thoracic compression fracture (HCC) 11/13/2022   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 04/24/2022   History of COVID-19 03/23/2022   Aortic insufficiency 06/08/2021   Ascending aortic aneurysm (HCC) 06/08/2021   Hypertension 06/07/2021   Conflict between patient and family 03/26/2021   Heart murmur 03/26/2021   Muscle cramping 03/26/2021   Right calf pain 03/22/2021   Excessive cerumen in right ear canal 01/14/2018   HPV in female 2019   Hematuria 07/31/2017   Asymmetric SNHL (sensorineural hearing loss) 11/29/2015   Subjective tinnitus of left ear 11/29/2015   Sensorineural hearing loss (SNHL), bilateral 11/29/2015   Cervical cancer screening 03/12/2015   Contact dermatitis 07/07/2013   Hyperlipidemia, mixed 03/19/2013   Hyperglycemia 03/19/2013   Osteopenia 03/19/2013   Preventative health care 09/08/2011   Osteoporosis 04/11/2010   FIBROIDS, UTERUS 03/07/2010   ELEVATED BLOOD PRESSURE WITHOUT DIAGNOSIS OF HYPERTENSION 03/07/2010   SKIN CANCER, HX OF 03/07/2010    PCP: Bradd Canary, MD   REFERRING PROVIDER: Bradd Canary, MD   REFERRING DIAG: R53.1 (ICD-10-CM) - Weakness  (Osteoporosis, falls and weakness)  THERAPY DIAG:  Muscle weakness (generalized)  Pain in thoracic spine  Pain of right upper arm  Stiffness  of right shoulder, not elsewhere classified  Stiffness of left shoulder, not elsewhere classified  RATIONALE FOR EVALUATION AND TREATMENT: Rehabilitation  ONSET DATE: 10/2022 - T8 compression fracture; 08/2023 - R upper arm pain & L lumpectomy and axillary node dissection secondary to breast cancer  NEXT MD VISIT: 05/01/24   SUBJECTIVE:   SUBJECTIVE STATEMENT: Pt reports no new changes since last visit.   EVAL: Pt reports h/o T8 compression fracture ~1 yr ago (11/13/22) while participating in a gym based exercise program focusing on osteoporosis (she describes standing in slight squat while lifting a resistance machine in front  of her when she felt a pop in her mid back).  She was initially told it was a muscle strain and only later identified as a compression fracture on x-ray.  She did complete an episode of PT following this with some benefit noted but later when trying to 3 reduce the exercises at home started to notice limitation due to R upper arm pain.  She has been walking and doing some weightbearing exercises but has not progressed her strengthening and would like to do so.  In November she was diagnosed with breast cancer and underwent a lumpectomy and axillary node dissection on the left for which she was prescribed a HEP to prevent lymphedema.  She she continues to note some pain in her R upper arm over the past few months which limits her ability to reach with her R UE, perform her lymphedema exercises or sleep on her R side.  PAIN: Are you having pain? No and Yes: NPRS scale: 0/10  Pain location: mid back (~T8) Pain description: stiff, achy, uncomfortable Aggravating factors: cleaning (scrubbing the tub, vacuuming, esp when she does a lot in a day), otherwise unpredictable Relieving factors: heating pad, Tylenol  Are you having pain? No and Yes: NPRS scale: 0/10  Pain location: R lateral upper arm/shoulder Pain description: quick onset, pulling Aggravating factors: laying on R side, overhead shoulder ER (pec stretch), end range OH ROM Relieving factors: stopping triggering movement  PERTINENT HISTORY: T8 compression fracture 10/2022, osteoporosis, chronic B thoracic back pain, HTN, muscle cramping, L breast cancer, aortic insufficiency, ascending aortic aneurysm, B sensorineural hearing loss  PRECAUTIONS: Other: Osteoporosis with h/o thoracic compression fx  HAND DOMINANCE: Right  RED FLAGS: None  WEIGHT BEARING RESTRICTIONS: No  FALLS:  Has patient fallen in last 6 months? 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 To get rid of the arm pain and minimize the back pain. To stay active."   OBJECTIVE: (objective measures completed at initial evaluation unless otherwise dated)  DIAGNOSTIC FINDINGS:  06/20/23 - MR thoracic spine: IMPRESSION: 1. No acute osseous injury of the thoracic spine. 2. Chronic T8 vertebral body compression fracture with 50% height loss anteriorly.  04/18/23 - XR thoracic spine: T8 compression fracture with anterior height loss.   No significant change in height loss since films on 12/12/2022. No other  fracture seen.  No spondylolisthesis seen.   PATIENT SURVEYS:  ABC scale 1390 / 1600 =  86.9 %, > 80% indicates high level of physical functioning Modified Oswestry 15 / 50 = 30.0 %, indicates moderate disability  Quick Dash 31.8 / 100 = 31.8 %  12/04/23: Modified Oswestry: 12 / 50 = 24.0 % QuickDASH: 31.8 / 100 = 31.8 %  COGNITION: Overall cognitive status: Within functional limits for tasks assessed     SENSATION: WFL  POSTURE:  rounded shoulders, forward head, and increased thoracic kyphosis  CERVICAL ROM:  Grossly WFL/WNL  UPPER EXTREMITY ROM:   Active ROM Right eval Left eval R 11/27/23 L 11/27/23  Shoulder flexion 120 124 128 129  Shoulder extension 40 ^ 46 50 50  Shoulder abduction 142 ^ 138 114 ^ 136  Shoulder adduction      Shoulder internal rotation FIR L3 ^ FIR T12 FIR L1 FIR T12  Shoulder external rotation FER T3 ^ FER T3 FER T5 FER T5  (Blank rows = not tested, ^ - increased pain)  UPPER EXTREMITY MMT:  MMT Right eval Left eval R 12/11/23 L 12/11/23  Shoulder flexion 4 4 4+ 4+   Shoulder extension 4 4 4+ 4  Shoulder abduction 4 4 4+ 4+ *  Shoulder adduction      Shoulder internal rotation 4+ 4+ 4+ 4+  Shoulder external rotation 4 4 4 4   Middle trapezius 4- 4-    Lower trapezius Unable Unable    Elbow flexion      Elbow extension      Wrist flexion      Wrist extension      Wrist ulnar deviation      Wrist radial deviation      Wrist pronation      Wrist supination      Grip strength (lbs)      (Blank rows = not tested, * - discomfort)  MUSCLE LENGTH: Hamstrings: Mild tight B ITB: WFL Piriformis: Mild tight B Hip flexors: WFL Quads: Mild tight B  LOWER EXTREMITY ROM: Grossly WFL/WNL  LOWER EXTREMITY MMT:  MMT Right eval Left eval R 12/11/23 L 12/11/23  Hip flexion 4- 4- 4+ 4  Hip extension 4+ 4+ 4+ 4+  Hip abduction 4- 4- 4 4+  Hip adduction 4- 4- 4- 4  Hip internal rotation 4+ 4+ 4+ 4+  Hip external rotation 3+ 3+ 4 4-  Knee flexion 5 5    Knee extension 5 5    Ankle dorsiflexion 5 5    Ankle plantarflexion      Ankle inversion      Ankle eversion       (Blank rows = not tested)  BED MOBILITY:  Independent  TRANSFERS: Assistive device utilized: None  Sit to stand: Complete Independence Stand to sit: Complete Independence Chair to chair: Complete Independence Floor:  NT  GAIT: Distance walked: Clinic distances Assistive device utilized: None Level of assistance: Complete Independence Gait pattern: WFL   TODAY'S TREATMENT: [facility] Adult PT Treatment:                                                DATE: 12/24/23 Pt seen for aquatic therapy today.  Treatment took place in water 3.5-4.75 ft in depth at the [facility] pool. Temp of water was 91.  Pt entered/exited the pool via stairs independently with bilat rail.  - Intro to aquatic therapy principles - unsupported walking forward/ backward -  side stepping with arm addct/ abdct x 2 laps -> with rainbow hand floats (challenge) - wide stance, then staggered stance with  bilat horiz abdct/ addct with rainbow hand floats x 5 each  - walking -> marching forward/ backward with reciprocal row with rainbow hand floats - UE on hand floats:  toe/heel raisesx 10 ; hip add/abdx 10 (balance challenge)  - UE on wall: leg swings into hip flexion /extension x 10 (too difficult with hand floats) - straddling yellow noodle, no UE support: cycling in deeper water    12/18/2023  THERAPEUTIC EXERCISE: To improve strength and endurance.  Demonstration, verbal and tactile cues throughout for technique.  NuStep - L6 x 6 min (UE/LE)  MANUAL THERAPY: To promote normalized muscle tension, improved flexibility, increased ROM, and reduced pain utilizing connective tissue massage, therapeutic massage, manual TP therapy, and myofascial release.  STM/DTM, XFM, manual TPR and gentle IASTM to L anterolateral deltoids and biceps Gentle STM and manual TPR to L lateral pecs STM/DTM, XFM, manual TPR and gentle IASTM to R anterior, lateral and posterior deltoids    12/13/2023  THERAPEUTIC EXERCISE: To improve strength and endurance.  Demonstration, verbal and tactile cues throughout for technique.  Rec Bike - L5 x 6 min BATCA seated lat pulldown 5# x 10 BATCA standing lat pulldown 5# x 10 BATCA standing straight arm pulldown 5# x 10 BATCA seated low row 15# x 10 - performed w/o chest pad to increase core/abdominal muscle engagement BATCA seated mid row 15# x 10 - performed w/o chest pad to increase core/abdominal muscle engagement BATCA chest press horiz grip 5# x 10 - cues to avoid rounding away from seat back BATCA chest press vertical grip 5# x 10 - cues to avoid rounding away from seat back  BATCA knee flexion B LE 20# x 10 BATCA knee extension B LE 20# x 10 BATCA leg press B LE 20# x 10 BATCA ankle press B LE 20# x 10   12/11/2023  THERAPEUTIC EXERCISE: To improve strength and endurance.  Demonstration, verbal and tactile cues throughout for technique.  NuStep - L5 x 6 min  (UE/LE) Hooklying TrA + blue TB bent knee fallout 10 x 3" Hooklying TrA + blue TB hip flexion march 10 x 3" Hooklying B scap retraction + RTB horiz ABD - clarified tension on band   UE/LE MMT  NEUROMUSCULAR RE-EDUCATION: To improve kinesthesia, posture, and proprioception.  Quadruped over peanut ball  Alt UE raises x 10 Alt LE raises x 10 Tall kneel hip hinges x 10 Child's pose with 3-way orange Pball rollout x 10 for paraspinal stretch and increased shoulder flexion  SELF CARE: Provided education  on safe exercise options to reduce risk for osteoporotic fracture .  Per patient inquiry, discussed benefit of aquatic PT utilizing the buoyancy and hydrostatic pressure of water for support and to offload joints along with the viscosity of the water for resistance of strengthening and water current perturbations to provide challenge to standing balance for increased core activation.  Patient states she would feel more comfortable having formal instruction in appropriate exercises before attempting aquatic exercises on her own, therefore will plan to schedule a few aquatic visits as part of current POC to help her develop an HEP that she could use at one of the [facility] pools.   PATIENT EDUCATION:  Education details:  Appropriate use of gym equipment including precautions related to osteoporosis Person educated: Patient Education method: Explanation Education comprehension: verbalized understanding  HOME EXERCISE PROGRAM:  Access Code: [email or web address] URL: [email or web address] Date: 12/06/2023 Prepared by: Glenetta Hew  Exercises - Doorway Pec Stretch at 60 Degrees Abduction with Arm Straight  - 2 x daily - 7 x weekly - 3 reps - 30 sec hold - Seated Thoracic Lumbar Extension  - 1-2 x daily - 7 x weekly - 2 sets - 10 reps - 3 sec hold - Supine Shoulder External Rotation with Dowel  - 1-2 x daily - 7 x weekly - 2 sets - 10 reps - 3 sec hold - Supine Shoulder Flexion AAROM with Dowel   - 1-2 x daily - 7 x weekly - 2 sets - 10 reps - 3 sec hold - Supine Shoulder Protraction with Dowel  - 1-2 x daily - 7 x weekly - 2 sets - 10 reps - 3 sec hold - Shoulder Flexion Wall Slide with Towel  - 1-2 x daily - 7 x weekly - 2 sets - 10 reps - 3 sec hold - Supine Shoulder Horizontal Abduction with Resistance  - 1 x daily - 3-4 x weekly - 2 sets - 10 reps - 3 sec hold - Hooklying Single Leg Bent Knee Fallouts with Resistance  - 1 x daily - 3-4 x weekly - 2 sets - 10 reps - 3 sec hold - Supine March with Resistance Band  - 1 x daily - 3-4 x weekly - 2 sets - 10 reps - 2-3 sec hold hold - Supine Hip Adduction Isometric with Ball  - 2 x daily - 3-4 x weekly - 2 sets - 10 reps - 5 sec hold - Seated Bilateral Shoulder External Rotation with Resistance  - 1 x daily - 3-4 x weekly - 2 sets - 10 reps - 3 sec hold - Standing Bilateral Low Shoulder Row with Anchored Resistance  - 1 x daily - 3-4 x weekly - 2 sets - 10 reps - 5 sec hold - Scapular Retraction with Resistance Advanced  - 1 x daily - 3-4 x weekly - 2 sets - 10 reps - 5 sec hold - Low Trap Setting at Wall  - 1 x daily - 3-4 x weekly - 2 sets - 10 reps - 3 sec hold  Patient Education - Do it right & prevent fractures - Osteoporosis education - Posture and Body Mechanics - Trigger Point Dry Needling - Kinesiology tape   ASSESSMENT:  CLINICAL IMPRESSION: Pt demonstrates safety and independence in aquatic setting with therapist instructing from deck. Pt demonstrates confidence in setting, moving throughout all depths easily.  Her balance is challenged with SLS exercises. No production of symptoms during session.  Goals are ongoing.    With her next land-based visit we will determine need for recertification vs readiness to transition to her HEP at the end of her current POC.   OBJECTIVE IMPAIRMENTS: decreased activity tolerance, decreased knowledge of condition, difficulty walking, decreased ROM, decreased strength, decreased safety  awareness, hypomobility, increased fascial restrictions, impaired perceived functional ability, increased muscle spasms, impaired flexibility, impaired UE functional use, improper body mechanics, postural dysfunction, and pain.   ACTIVITY LIMITATIONS: carrying, lifting, bending, sitting, standing, reach over head, locomotion level, and caring for others  PARTICIPATION LIMITATIONS: meal prep, cleaning, laundry, driving, shopping, community activity, and yard work  PERSONAL FACTORS: Past/current experiences, Time since onset of injury/illness/exacerbation, and 3+ comorbidities: T8 compression fracture 10/2022, osteoporosis, chronic B thoracic back pain, HTN, muscle cramping, L breast cancer, aortic insufficiency, ascending aortic aneurysm, B sensorineural hearing loss  are also affecting patient's  functional outcome.   REHAB POTENTIAL: Excellent  CLINICAL DECISION MAKING: Evolving/moderate complexity  EVALUATION COMPLEXITY: Moderate   GOALS: Goals reviewed with patient? Yes  SHORT TERM GOALS: Target date: 12/06/2023   Patient will be independent with initial HEP. Baseline: HEP initiated on eval Goal status: MET - 11/27/23   2.  Patient will report 25% improvement in mid back & R UE pain to improve QOL. Baseline: Thoracic back pain up to 5/10, R upper arm pain up to 4-5/10 Goal status: PARTIALLY MET - 12/11/23 - back pain 10-15% improved but still more likely to persist when present; R upper arm pain 15-20% improved with pain typically only momentary with certain movements now and does not seem to linger   LONG TERM GOALS: Target date: 01/03/2024  Patient will be independent with ongoing/advanced HEP for self-management at home.  Baseline:  Goal status: IN PROGRESS - 12/11/23 - met for current HEP  2.  Patient will report 50-75% improvement in low back and R UE pain to improve QOL.  Baseline:  Thoracic back pain up to 5/10, R upper arm pain up to 4-5/10 Goal status: IN PROGRESS - 12/11/23 -  back pain 10-15% improved but still more likely to persist when present; R upper arm pain 15-20% improved with pain typically only momentary with certain movements now and does not seem to linger   3.  Patient to demonstrate awareness of osteoporosis precautions as well as ability to achieve and maintain good spinal alignment/posturing and body mechanics needed for daily activities. Baseline:  Goal status: MET - 11/22/23  4.  Patient will demonstrate functional pain free B shoulder ROM to perform ADLs.   Baseline: Refer to above UE ROM table Goal status: IN PROGRESS - 11/27/23 - B shoulders improving in most planes except R ABD still limited by pain  5  Patient will demonstrate improved B shoulder and scapular strength to >/= 4+/5 for functional UE use Baseline: Refer to above UE MMT table Goal status: PARTIALLY MET - 12/11/23 - overall improved but L slightly weaker than R  6.  Patient will demonstrate improved B LE strength to >/= 4+/5 for improved stability and ease of mobility. Baseline: Refer to above LE MMT table Goal status: PARTIALLY MET - 12/11/23 - overall improved but L slightly weaker than R  7.  Patient will report </= 22% on QuickDASH (MCID = 10 pts) to demonstrate improved functional UE use with decreased pain interference. Baseline: 31.8 / 100 = 31.8 % Goal status: IN PROGRESS - 12/03/25 - 31.8 / 100 = 31.8 %  8. Patient will report </= 18% on Modified Oswestry (MCID = 12%) to demonstrate improved functional ability with decreased pain interference. Baseline: 15 / 50 = 30.0 % Goal status: IN PROGRESS - 12/04/23 - 12 / 50 = 24.0 %   PLAN:  PT FREQUENCY: 2x/week  PT DURATION: 8 weeks  PLANNED INTERVENTIONS: 97164- PT Re-evaluation, 97110-Therapeutic exercises, 97530- Therapeutic activity, 97112- Neuromuscular re-education, 97535- Self Care, 11914- Manual therapy, [id number]- Gait training, [phone]- Aquatic Therapy, 97014- Electrical stimulation (unattended), [id number]- Electrical  stimulation (manual), 97016- Vasopneumatic device, [id number]- Ultrasound, [id number]- Ionotophoresis 4mg /ml Dexamethasone, Patient/Family education, Balance training, Stair training, Taping, Dry Needling, Joint mobilization, Spinal mobilization, Cryotherapy, and Moist heat  PLAN FOR NEXT SESSION: Aquatic PT to initiate training in water-based HEP for carryover at [facility] vs [facility]  Gently progress postural stretching and strengthening as well as core and proximal LE (hip and quad/VMO) strengthening; review & update HEP PRN; MT +/- TPDN  to address abnormal muscle tension restricting posture and R shoulder ROM; modalities as needed for pain management; review education on osteoporosis precautions as well as proper posture and body mechanics PRN   Mayer Camel, PTA 12/28/23 8:36 AM [facility] [address] [place], [place], [address] Phone: [phone]   Fax:  [phone]    Date of referral: 10/30/2023 Referring provider: Bradd Canary, MD Referring diagnosis? R53.1 (ICD-10-CM) - Weakness  (Osteoporosis, falls and weakness) Treatment diagnosis? (if different than referring diagnosis)  Muscle weakness (generalized)  Pain in thoracic spine  Pain of right upper arm  Stiffness of right shoulder, not elsewhere classified  Stiffness of left shoulder, not elsewhere classified  What was this (referring dx) caused by? Ongoing Issue and Other: Weakness related to osteoporosis with T8 compression fracture  Ashby Dawes of Condition: Chronic (continuous duration > 3 months)   Laterality: Both  Current Functional Measure Score: Back Index 12 / 50 = 24.0 %  & QuickDASH 31.8 / 100 = 31.8 %  Objective measurements identify impairments when they are compared to normal values, the uninvolved extremity, and prior level of function.  [x]  Yes  []  No  Objective assessment of functional ability: Moderate functional limitations   Briefly describe  symptoms: Thoracic back pain from history of T8 compression fracture 1 year ago.  She also notes recent onset of R upper arm pain originating around the time she underwent lumpectomy and axillary node dissection for breast cancer. Current deficits include pain as described above, postural abnormalities, core and postural weakness with limited proximal UE ROM and strength as well as proximal LE weakness.    How did symptoms start: Sudden onset for thoracic back pain at time of T8 compression fracture on 11/13/2022.  Gradual onset for R upper arm pain.  Weakness as a sequelae to both.  Average pain intensity:  Last 24 hours: 5/10  Past week: 3/10  How often does the pt experience symptoms? Intermittently  How much have the symptoms interfered with usual daily activities? A little bit  How has condition changed since care began at this facility? A little better  In general, how is the patients overall health? Good  Onset date: 10/2022 - T8 compression fracture; 08/2023 - R upper arm pain & L lumpectomy and axillary node dissection secondary to breast cancer   BACK PAIN (STarT Back Screening Tool) - (When applicable):  Has your back pain spread down your leg(s) at sometime in the last 2 weeks? []  Yes   [x]  No Have you had pain in the shoulder or neck at sometime in the past 2 weeks? [x]  Yes   []  No Have you only walked short distances because of your back pain? []  Yes   [x]  No In the past 2 weeks, have you dressed more slowly than usual because of your back pain? []  Yes   [x]  No Do you think it is not really safe for person with a condition like yours to be physically active? []  Yes   [x]  No Have worrying thoughts been going through your mind a lot of the time? []  Yes   [x]  No Do you feel that your back pain is terrible and it is never going to get any better? []  Yes   [x]  No In general, have you stopped enjoying all the things you usually enjoy? []  Yes   [x]  No Overall, how bothersome  has your back pain been in the last 2 weeks? []  Not at  all   []  Slightly     [x]  Moderate   []  Very much     []  Extremely

## 2023-12-27 ENCOUNTER — Ambulatory Visit: Payer: Medicare Other | Admitting: Physical Therapy

## 2023-12-27 ENCOUNTER — Encounter: Payer: Self-pay | Admitting: Physical Therapy

## 2023-12-27 DIAGNOSIS — M6281 Muscle weakness (generalized): Secondary | ICD-10-CM | POA: Diagnosis not present

## 2023-12-27 DIAGNOSIS — M79621 Pain in right upper arm: Secondary | ICD-10-CM

## 2023-12-27 DIAGNOSIS — M25612 Stiffness of left shoulder, not elsewhere classified: Secondary | ICD-10-CM

## 2023-12-27 DIAGNOSIS — M546 Pain in thoracic spine: Secondary | ICD-10-CM

## 2023-12-27 DIAGNOSIS — M25611 Stiffness of right shoulder, not elsewhere classified: Secondary | ICD-10-CM

## 2023-12-27 NOTE — Therapy (Signed)
 OUTPATIENT PHYSICAL THERAPY TREATMENT / RE-CERTIFICATION  Progress Note  Reporting Period 12/11/2023 to 12/27/2023   See note below for Objective Data and Assessment of Progress/Goals.     Patient Name: Yolanda Hernandez MRN: [id number] DOB:1956-03-16, 67 y.o., female Today's Date: 12/27/2023  END OF SESSION:  PT End of Session - 12/27/23 1315     Visit Number 13    Date for PT Re-Evaluation 02/07/24    Authorization Type [employer] Medicare    Authorization Time Period 12/05/23 - 01/02/24    Authorization - Visit Number 6    Authorization - Number of Visits 6    Progress Note Due on Visit 23   Recert/PN on visit #13 - 12/27/23   PT Start Time 1315    PT Stop Time 1407    PT Time Calculation (min) 52 min    Behavior During Therapy [facility] for tasks assessed/performed             Past Medical History:  Diagnosis Date   Cerumen impaction    chronic   Cervical cancer screening 03/12/2015   Menarche at 10 Regular and moderate flow  history of abnormal pap in past, bx and cryo once many years ago, normal since. Last 2011 and normal G0P0, s/p No history of abnormal MGM Last in 2015, no breast concerns  No concerns today Biopsy, cervical: gyn surgeries Menopause at 53 ish   Conflict between patient and family 03/26/2021   Dysmenorrhea    Heart murmur 03/26/2021   Hematuria 07/31/2017   HPV in female 2019   Hyperglycemia 03/19/2013   Hypertension    Muscle cramping 03/26/2021   Osteopenia    Osteoporosis 04/11/2010   Qualifier: Diagnosis of  By: Nena Jordan    Other and unspecified hyperlipidemia 03/19/2013   Preventative health care 09/08/2011   Right calf pain 03/22/2021   Skin cancer    Skin cancer 07/14/2016   Uterine fibroid    Past Surgical History:  Procedure Laterality Date   BREAST SURGERY     biopsy left   COLPOSCOPY     GYNECOLOGIC CRYOSURGERY     MYRINGOPLASTY  1972   TONSILLECTOMY     Patient Active Problem List   Diagnosis Date Noted   Breast cancer,  left (HCC) 10/30/2023   Weakness 03/20/2023   Closed wedge compression fracture of T8 vertebra (HCC) 03/20/2023   Chronic bilateral thoracic back pain 03/20/2023   Abnormal posture 03/20/2023   Thoracic compression fracture (HCC) 11/13/2022   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 04/24/2022   History of COVID-19 03/23/2022   Aortic insufficiency 06/08/2021   Ascending aortic aneurysm (HCC) 06/08/2021   Hypertension 06/07/2021   Conflict between patient and family 03/26/2021   Heart murmur 03/26/2021   Muscle cramping 03/26/2021   Right calf pain 03/22/2021   Excessive cerumen in right ear canal 01/14/2018   HPV in female 2019   Hematuria 07/31/2017   Asymmetric SNHL (sensorineural hearing loss) 11/29/2015   Subjective tinnitus of left ear 11/29/2015   Sensorineural hearing loss (SNHL), bilateral 11/29/2015   Cervical cancer screening 03/12/2015   Contact dermatitis 07/07/2013   Hyperlipidemia, mixed 03/19/2013   Hyperglycemia 03/19/2013   Osteopenia 03/19/2013   Preventative health care 09/08/2011   Osteoporosis 04/11/2010   FIBROIDS, UTERUS 03/07/2010   ELEVATED BLOOD PRESSURE WITHOUT DIAGNOSIS OF HYPERTENSION 03/07/2010   SKIN CANCER, HX OF 03/07/2010    PCP: Bradd Canary, MD   REFERRING PROVIDER: Bradd Canary, MD  REFERRING DIAG: R53.1 (ICD-10-CM) - Weakness  (Osteoporosis, falls and weakness)  THERAPY DIAG:  Muscle weakness (generalized)  Pain in thoracic spine  Pain of right upper arm  Stiffness of right shoulder, not elsewhere classified  Stiffness of left shoulder, not elsewhere classified  RATIONALE FOR EVALUATION AND TREATMENT: Rehabilitation  ONSET DATE: 10/2022 - T8 compression fracture; 08/2023 - R upper arm pain & L lumpectomy and axillary node dissection secondary to breast cancer  NEXT MD VISIT: 05/01/24   SUBJECTIVE:   SUBJECTIVE STATEMENT: Pt reports the pool session went well and she has found an [facility] owned fitness facility  in [place] that has a 90 therapy pool so she signed up for a membership at this location.   EVAL: Pt reports h/o T8 compression fracture ~1 yr ago (11/13/22) while participating in a gym based exercise program focusing on osteoporosis (she describes standing in slight squat while lifting a resistance machine in front of her when she felt a pop in her mid back).  She was initially told it was a muscle strain and only later identified as a compression fracture on x-ray.  She did complete an episode of PT following this with some benefit noted but later when trying to 3 reduce the exercises at home started to notice limitation due to R upper arm pain.  She has been walking and doing some weightbearing exercises but has not progressed her strengthening and would like to do so.  In November she was diagnosed with breast cancer and underwent a lumpectomy and axillary node dissection on the left for which she was prescribed a HEP to prevent lymphedema.  She she continues to note some pain in her R upper arm over the past few months which limits her ability to reach with her R UE, perform her lymphedema exercises or sleep on her R side.  PAIN: Are you having pain? No and Yes: NPRS scale: 0/10  Pain location: mid back (~T8) Pain description: stiff, achy, uncomfortable Aggravating factors: cleaning (scrubbing the tub, vacuuming, esp when she does a lot in a day), otherwise unpredictable Relieving factors: heating pad, Tylenol  Are you having pain? No and Yes: NPRS scale: 0/10  Pain location: R lateral upper arm/shoulder Pain description: quick onset, pulling Aggravating factors: laying on R side, overhead shoulder ER (pec stretch), end range OH ROM Relieving factors: stopping triggering movement  PERTINENT HISTORY: T8 compression fracture 10/2022, osteoporosis, chronic B thoracic back pain, HTN, muscle cramping, L breast cancer, aortic insufficiency, ascending aortic aneurysm, B sensorineural hearing  loss  PRECAUTIONS: Other: Osteoporosis with h/o thoracic compression fx  HAND DOMINANCE: Right  RED FLAGS: None  WEIGHT BEARING RESTRICTIONS: No  FALLS:  Has patient fallen in last 6 months? 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 To get rid of the arm pain and minimize the back pain. To stay active."   OBJECTIVE: (objective measures completed at initial evaluation unless otherwise dated)  DIAGNOSTIC FINDINGS:  06/20/23 - MR thoracic spine: IMPRESSION: 1. No acute osseous injury of the thoracic  spine. 2. Chronic T8 vertebral body compression fracture with 50% height loss anteriorly.  04/18/23 - XR thoracic spine: T8 compression fracture with anterior height loss.   No significant change in height loss since films on 12/12/2022. No other  fracture seen.  No spondylolisthesis seen.   PATIENT SURVEYS:  ABC scale 1390 / 1600 = 86.9 %, > 80% indicates high level of physical functioning Modified Oswestry 15 / 50 = 30.0 %, indicates moderate disability  Quick Dash 31.8 / 100 = 31.8 %  12/04/23: Modified Oswestry: 12 / 50 = 24.0 % QuickDASH: 31.8 / 100 = 31.8 %  12/27/23:   COGNITION: Overall cognitive status: Within functional limits for tasks assessed     SENSATION: WFL  POSTURE:  rounded shoulders, forward head, and increased thoracic kyphosis  CERVICAL ROM:  Grossly WFL/WNL  UPPER EXTREMITY ROM:   Active ROM Right eval Left eval R 11/27/23 L 11/27/23 R 12/27/23 L 12/27/23  Shoulder flexion 120 124 128 129 135 ^ 131  ^  Shoulder extension 40 ^ 46 50 50 54 50  Shoulder abduction 142 ^ 138 114 ^ 136 150 ^ 140 ^  Shoulder adduction        Shoulder internal rotation FIR L3 ^ FIR T12 FIR L1 FIR T12 FIR L3 ^ FIR T12  Shoulder external rotation FER T3 ^ FER T3 FER T5 FER T5 FER T4 FER T5  (Blank rows = not tested, ^ - increased pain)  UPPER EXTREMITY MMT:  MMT Right eval Left eval R 12/11/23 L 12/11/23 R 12/27/23 L 12/27/23  Shoulder flexion 4 4 4+ 4+ 5 5  Shoulder extension 4 4 4+ 4 5 5   Shoulder abduction 4 4 4+ 4+ * 4+ 4+  Shoulder adduction        Shoulder internal rotation 4+ 4+ 4+ 4+ 5 5  Shoulder external rotation 4 4 4 4 5  4+  Middle trapezius 4- 4-   4 4  Lower trapezius Unable Unable   3- 3-  Elbow flexion        Elbow extension        Wrist flexion        Wrist extension        Wrist ulnar deviation        Wrist radial deviation        Wrist pronation        Wrist supination        Grip strength (lbs)        (Blank rows = not tested, * - discomfort)  MUSCLE LENGTH: Hamstrings: Mild tight B ITB: WFL Piriformis: Mild tight B Hip flexors: WFL Quads: Mild tight B  LOWER EXTREMITY ROM: Grossly WFL/WNL  LOWER EXTREMITY MMT:  MMT Right eval Left eval R 12/11/23 L 12/11/23 R 12/27/23 L 12/27/23  Hip flexion 4- 4- 4+ 4 5 4+  Hip extension 4+ 4+ 4+ 4+ 5 5  Hip abduction 4- 4- 4 4+ 4+ 5  Hip adduction 4- 4- 4- 4 4+ 4+  Hip internal rotation 4+ 4+ 4+ 4+ 5 4+  Hip external rotation 3+ 3+ 4 4- 4+ 4  Knee flexion 5 5      Knee extension 5 5      Ankle dorsiflexion 5 5      Ankle plantarflexion        Ankle inversion        Ankle eversion         (Blank rows = not tested)  BED  MOBILITY:  Independent  TRANSFERS: Assistive device utilized: None  Sit to stand: Complete Independence Stand to sit: Complete Independence Chair to chair: Complete Independence Floor:  NT  GAIT: Distance walked: Clinic distances Assistive device utilized: None Level of assistance: Complete  Independence Gait pattern: WFL   TODAY'S TREATMENT:  12/27/23 THERAPEUTIC EXERCISE: To improve strength and endurance.  Demonstration, verbal and tactile cues throughout for technique.  NuStep - L6 x 6 min (UE/LE) Verbal HEP review with pt denying any concerns, need for physical practice or resistance modifications  THERAPEUTIC ACTIVITIES: To improve functional performance.  Demonstration, verbal and tactile cues throughout for technique. Modified Oswestry: 11 / 50 = 22.0 % QuickDASH: 27.3 / 100 = 27.3 % UE ROM UE/LE MMT  Goal assessment   [facility] Adult PT Treatment:                                                DATE: 12/24/23 Pt seen for aquatic therapy today.  Treatment took place in water 3.5-4.75 ft in depth at the [facility] pool. Temp of water was 91.  Pt entered/exited the pool via stairs independently with bilat rail.  - Intro to aquatic therapy principles - unsupported walking forward/ backward - side stepping with arm addct/ abdct x 2 laps -> with rainbow hand floats (challenge) - wide stance, then staggered stance with bilat horiz abdct/ addct with rainbow hand floats x 5 each  - walking -> marching forward/ backward with reciprocal row with rainbow hand floats - UE on hand floats:  toe/heel raisesx 10 ; hip add/abdx 10 (balance challenge)  - UE on wall: leg swings into hip flexion /extension x 10 (too difficult with hand floats) - straddling yellow noodle, no UE support: cycling in deeper water     12/18/2023  THERAPEUTIC EXERCISE: To improve strength and endurance.  Demonstration, verbal and tactile cues throughout for technique.  NuStep - L6 x 6 min (UE/LE)  MANUAL THERAPY: To promote normalized muscle tension, improved flexibility, increased ROM, and reduced pain utilizing connective tissue massage, therapeutic massage, manual TP therapy, and myofascial release.  STM/DTM, XFM, manual TPR and gentle IASTM to L anterolateral deltoids and biceps Gentle STM and  manual TPR to L lateral pecs STM/DTM, XFM, manual TPR and gentle IASTM to R anterior, lateral and posterior deltoids    PATIENT EDUCATION:  Education details: progress with PT, ongoing PT POC, HEP review, and continue with current HEP Person educated: Patient Education method: Explanation Education comprehension: verbalized understanding  HOME EXERCISE PROGRAM: Access Code: [email or web address] URL: [email or web address] Date: 12/06/2023 Prepared by: Glenetta Hew  Exercises - Doorway Pec Stretch at 60 Degrees Abduction with Arm Straight  - 2 x daily - 7 x weekly - 3 reps - 30 sec hold - Seated Thoracic Lumbar Extension  - 1-2 x daily - 7 x weekly - 2 sets - 10 reps - 3 sec hold - Supine Shoulder External Rotation with Dowel  - 1-2 x daily - 7 x weekly - 2 sets - 10 reps - 3 sec hold - Supine Shoulder Flexion AAROM with Dowel  - 1-2 x daily - 7 x weekly - 2 sets - 10 reps - 3 sec hold - Supine Shoulder Protraction with Dowel  - 1-2 x daily - 7 x weekly - 2 sets - 10 reps - 3 sec hold - Shoulder Flexion  Wall Slide with Towel  - 1-2 x daily - 7 x weekly - 2 sets - 10 reps - 3 sec hold - Supine Shoulder Horizontal Abduction with Resistance  - 1 x daily - 3-4 x weekly - 2 sets - 10 reps - 3 sec hold - Hooklying Single Leg Bent Knee Fallouts with Resistance  - 1 x daily - 3-4 x weekly - 2 sets - 10 reps - 3 sec hold - Supine March with Resistance Band  - 1 x daily - 3-4 x weekly - 2 sets - 10 reps - 2-3 sec hold hold - Supine Hip Adduction Isometric with Ball  - 2 x daily - 3-4 x weekly - 2 sets - 10 reps - 5 sec hold - Seated Bilateral Shoulder External Rotation with Resistance  - 1 x daily - 3-4 x weekly - 2 sets - 10 reps - 3 sec hold - Standing Bilateral Low Shoulder Row with Anchored Resistance  - 1 x daily - 3-4 x weekly - 2 sets - 10 reps - 5 sec hold - Scapular Retraction with Resistance Advanced  - 1 x daily - 3-4 x weekly - 2 sets - 10 reps - 5 sec hold - Low Trap Setting at Wall   - 1 x daily - 3-4 x weekly - 2 sets - 10 reps - 3 sec hold  Patient Education - Do it right & prevent fractures - Osteoporosis education - Posture and Body Mechanics - Trigger Point Dry Needling - Kinesiology tape   ASSESSMENT:  CLINICAL IMPRESSION: Yolanda Hernandez reports her back pain is not as frequent but feels her B shoulder pain, while only short-lived when present, is more limiting functionally affecting daily activities such as dressing and reaching overhead.  B shoulder ROM has improved in most planes but remains limited by pain in overhead ROM bilaterally and FIR on right. Overall B shoulder strength now 4+ to 5/5 with exception of scapular muscles at 4/5 for middle traps and 3-/5 for lower traps.   Proximal LE strength also improving with overall MMT 4+ to 5/5 with exception of L hip ER at 4/5.  Gains noted in patient reported outcome measures on both QuickDASH and modified Oswestry but not yet to anticipated goals levels.  Yolanda Hernandez will benefit from continued skilled PT to address ongoing pain, ROM and strength deficits to improve mobility and activity tolerance with decreased pain interference, therefore will recommend recert for additional 2x/wk for up to 4-6 weeks.  She would like to initially focus on the aquatic visits and then return to land-based PT in ~3 wks to reassess readiness to fully transition to her HEP vs need for further skilled PT.  OBJECTIVE IMPAIRMENTS: decreased activity tolerance, decreased knowledge of condition, difficulty walking, decreased ROM, decreased strength, decreased safety awareness, hypomobility, increased fascial restrictions, impaired perceived functional ability, increased muscle spasms, impaired flexibility, impaired UE functional use, improper body mechanics, postural dysfunction, and pain.   ACTIVITY LIMITATIONS: carrying, lifting, bending, sitting, standing, reach over head, locomotion level, and caring for others  PARTICIPATION LIMITATIONS: meal prep,  cleaning, laundry, driving, shopping, community activity, and yard work  PERSONAL FACTORS: Past/current experiences, Time since onset of injury/illness/exacerbation, and 3+ comorbidities: T8 compression fracture 10/2022, osteoporosis, chronic B thoracic back pain, HTN, muscle cramping, L breast cancer, aortic insufficiency, ascending aortic aneurysm, B sensorineural hearing loss  are also affecting patient's functional outcome.   REHAB POTENTIAL: Excellent  CLINICAL DECISION MAKING: Evolving/moderate complexity  EVALUATION COMPLEXITY: Moderate   GOALS: Goals  reviewed with patient? Yes  SHORT TERM GOALS: Target date: 12/06/2023   Patient will be independent with initial HEP. Baseline: HEP initiated on eval Goal status: MET - 11/27/23   2.  Patient will report 25% improvement in mid back & R UE pain to improve QOL. Baseline: Thoracic back pain up to 5/10, R upper arm pain up to 4-5/10 Goal status: PARTIALLY MET - 12/27/23 - back pain 30% improved but still more likely to persist when present; R upper arm pain 15-20% improved with pain typically only momentary with certain movements now and does not seem to linger   LONG TERM GOALS: Target date: 01/03/2024; extended to 02/07/2024  Patient will be independent with ongoing/advanced HEP for self-management at home.  Baseline:  Goal status: PARTIALLY MET - 12/27/23 - met for land-based HEP  2.  Patient will report 50-75% improvement in low back and R UE pain to improve QOL.  Baseline:  Thoracic back pain up to 5/10, R upper arm pain up to 4-5/10 Goal status: IN PROGRESS - 12/27/23 - back pain 30% improved but still more likely to persist when present; R upper arm pain 15-20% improved with pain typically only momentary with certain movements now and does not seem to linger   3.  Patient to demonstrate awareness of osteoporosis precautions as well as ability to achieve and maintain good spinal alignment/posturing and body mechanics needed for daily  activities. Baseline:  Goal status: MET - 11/22/23  4.  Patient will demonstrate functional pain free B shoulder ROM to perform ADLs.   Baseline: Refer to above UE ROM table Goal status: IN PROGRESS - 12/27/23 - B shoulders improving in most planes however pain still limiting OH ROM  5  Patient will demonstrate improved B shoulder and scapular strength to >/= 4+/5 for functional UE use Baseline: Refer to above UE MMT table Goal status: PARTIALLY MET - 12/27/23 - B shoulders now 4+ to 5/5 but scapular strength remains weaker  6.  Patient will demonstrate improved B LE strength to >/= 4+/5 for improved stability and ease of mobility. Baseline: Refer to above LE MMT table Goal status: MET - 12/27/23 - met except L hip ER 4/5  7.  Patient will report </= 22% on QuickDASH (MCID = 10 pts) to demonstrate improved functional UE use with decreased pain interference. Baseline: 31.8 / 100 = 31.8 %; 12/04/23 - 31.8 / 100 = 31.8 % Goal status: IN PROGRESS - 12/27/23 -  27.3 / 100 = 27.3 %  8. Patient will report </= 18% on Modified Oswestry (MCID = 12%) to demonstrate improved functional ability with decreased pain interference. Baseline: 15 / 50 = 30.0 %; 12/04/23 - 12 / 50 = 24.0 % Goal status: IN PROGRESS - 12/27/23 - 11 / 50 = 22.0 %   PLAN:  PT FREQUENCY: 2x/week  PT DURATION:  4-6 weeks   PLANNED INTERVENTIONS: 97164- PT Re-evaluation, 97110-Therapeutic exercises, 97530- Therapeutic activity, 97112- Neuromuscular re-education, 97535- Self Care, 29562- Manual therapy, [phone]- Gait training, [phone]- Aquatic Therapy, [phone]- Electrical stimulation (unattended), [phone]- Electrical stimulation (manual), 97016- Vasopneumatic device, [id number]- Ultrasound, [id number]- Ionotophoresis 4mg /ml Dexamethasone, Patient/Family education, Balance training, Stair training, Taping, Dry Needling, Joint mobilization, Spinal mobilization, Cryotherapy, Moist heat, and 97750- Physical Performance Test or Measurement  PLAN FOR NEXT  SESSION: Aquatic PT for training in water-based HEP   Marry Guan, PT 12/27/2023, 5:35 PM  [facility] [address]  [address] [place], [place],  [address] Phone: [phone]   Fax:  [phone]    Date of referral: 10/30/2023 Referring provider: Bradd Canary, MD Referring diagnosis? R53.1 (ICD-10-CM) - Weakness  (Osteoporosis, falls and weakness) Treatment diagnosis? (if different than referring diagnosis)  Muscle weakness (generalized)  Pain in thoracic spine  Pain of right upper arm  Stiffness of right shoulder, not elsewhere classified  Stiffness of left shoulder, not elsewhere classified  What was this (referring dx) caused by? Ongoing Issue and Other: Weakness related to osteoporosis with T8 compression fracture  Ashby Dawes of Condition: Chronic (continuous duration > 3 months)   Laterality: Both  Current Functional Measure Score: Back Index 11 / 50 = 22.0 %  & QuickDASH 27.3 / 100 = 27.3 %  Objective measurements identify impairments when they are compared to normal values, the uninvolved extremity, and prior level of function.  [x]  Yes  []  No  Objective assessment of functional ability: Moderate functional limitations   Briefly describe symptoms: Yolanda Hernandez reports her back pain is not as frequent but feels her B shoulder/upper arm pain, while only short-lived when present, is more limiting functionally affecting daily activities such as dressing and reaching overhead.  B shoulder ROM has improved in most planes but remains limited by pain in overhead ROM bilaterally and FIR on right. Overall B shoulder strength now 4+ to 5/5 with exception of scapular muscles at 4/5 for middle traps and 3-/5 for lower traps.   Proximal LE strength also improving with overall MMT 4+ to 5/5 with exception of L hip ER at 4/5.  Gains noted in patient reported outcome measures on both QuickDASH and modified Oswestry but not yet to  anticipated goals levels.  Yolanda Hernandez will benefit from continued skilled PT to address ongoing pain, ROM and strength deficits to improve mobility and activity tolerance with decreased pain interference, therefore will recommend recert for additional 2x/wk for up to 4-6 weeks.    Thoracic back pain from history of T8 compression fracture 1 year ago.  She also notes recent onset of R upper arm pain originating around the time she underwent lumpectomy and axillary node dissection for breast cancer. Current deficits include pain as described above, postural abnormalities, core and postural weakness with limited proximal UE ROM and strength as well as proximal LE weakness.    How did symptoms start: Sudden onset for thoracic back pain at time of T8 compression fracture on 11/13/2022.  Gradual onset for R upper arm pain.  Weakness as a sequelae to both.  Average pain intensity:  Last 24 hours: 2/10  Past week: 2/10  How often does the pt experience symptoms? Frequently  How much have the symptoms interfered with usual daily activities? A little bit  How has condition changed since care began at this facility? A little better  In general, how is the patients overall health? Good  Onset date: 10/2022 - T8 compression fracture; 08/2023 - R upper arm pain & L lumpectomy and axillary node dissection secondary to breast cancer   BACK PAIN (STarT Back Screening Tool) - (When applicable):  Has your back pain spread down your leg(s) at sometime in the last 2 weeks? []  Yes   [x]  No Have you had pain in the shoulder or neck at sometime in the past 2 weeks? [x]  Yes   []  No Have you only walked short distances because of your back pain? []  Yes   [x]  No In the past 2 weeks, have you dressed more slowly than usual because of your  back pain? []  Yes   [x]  No Do you think it is not really safe for person with a condition like yours to be physically active? []  Yes   [x]  No Have worrying thoughts been going  through your mind a lot of the time? []  Yes   [x]  No Do you feel that your back pain is terrible and it is never going to get any better? []  Yes   [x]  No In general, have you stopped enjoying all the things you usually enjoy? []  Yes   [x]  No Overall, how bothersome has your back pain been in the last 2 weeks? []  Not at all   []  Slightly     [x]  Moderate   []  Very much     []  Extremely

## 2024-01-01 ENCOUNTER — Encounter (HOSPITAL_BASED_OUTPATIENT_CLINIC_OR_DEPARTMENT_OTHER): Payer: Self-pay | Admitting: Physical Therapy

## 2024-01-01 ENCOUNTER — Ambulatory Visit (HOSPITAL_BASED_OUTPATIENT_CLINIC_OR_DEPARTMENT_OTHER): Payer: Medicare Other | Admitting: Physical Therapy

## 2024-01-01 DIAGNOSIS — M25612 Stiffness of left shoulder, not elsewhere classified: Secondary | ICD-10-CM

## 2024-01-01 DIAGNOSIS — M25611 Stiffness of right shoulder, not elsewhere classified: Secondary | ICD-10-CM

## 2024-01-01 DIAGNOSIS — M546 Pain in thoracic spine: Secondary | ICD-10-CM

## 2024-01-01 DIAGNOSIS — M6281 Muscle weakness (generalized): Secondary | ICD-10-CM

## 2024-01-01 DIAGNOSIS — M79621 Pain in right upper arm: Secondary | ICD-10-CM

## 2024-01-01 NOTE — Therapy (Signed)
 OUTPATIENT PHYSICAL THERAPY TREATMENT  Patient Name: Yolanda Hernandez MRN: [id number] DOB:1956-01-19, 68 y.o., female Today's Date: 01/01/2024  END OF SESSION:  PT End of Session - 01/01/24 1410     Visit Number 14    Date for PT Re-Evaluation 02/07/24    Authorization Type [employer] Medicare    Progress Note Due on Visit 23    PT Start Time 1400    PT Stop Time 1438    PT Time Calculation (min) 38 min    Behavior During Therapy WFL for tasks assessed/performed             Past Medical History:  Diagnosis Date   Cerumen impaction    chronic   Cervical cancer screening 03/12/2015   Menarche at 10 Regular and moderate flow  history of abnormal pap in past, bx and cryo once many years ago, normal since. Last 2011 and normal G0P0, s/p No history of abnormal MGM Last in 2015, no breast concerns  No concerns today Biopsy, cervical: gyn surgeries Menopause at 53 ish   Conflict between patient and family 03/26/2021   Dysmenorrhea    Heart murmur 03/26/2021   Hematuria 07/31/2017   HPV in female 2019   Hyperglycemia 03/19/2013   Hypertension    Muscle cramping 03/26/2021   Osteopenia    Osteoporosis 04/11/2010   Qualifier: Diagnosis of  By: Nena Jordan    Other and unspecified hyperlipidemia 03/19/2013   Preventative health care 09/08/2011   Right calf pain 03/22/2021   Skin cancer    Skin cancer 07/14/2016   Uterine fibroid    Past Surgical History:  Procedure Laterality Date   BREAST SURGERY     biopsy left   COLPOSCOPY     GYNECOLOGIC CRYOSURGERY     MYRINGOPLASTY  1972   TONSILLECTOMY     Patient Active Problem List   Diagnosis Date Noted   Breast cancer, left (HCC) 10/30/2023   Weakness 03/20/2023   Closed wedge compression fracture of T8 vertebra (HCC) 03/20/2023   Chronic bilateral thoracic back pain 03/20/2023   Abnormal posture 03/20/2023   Thoracic compression fracture (HCC) 11/13/2022   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 04/24/2022    History of COVID-19 03/23/2022   Aortic insufficiency 06/08/2021   Ascending aortic aneurysm (HCC) 06/08/2021   Hypertension 06/07/2021   Conflict between patient and family 03/26/2021   Heart murmur 03/26/2021   Muscle cramping 03/26/2021   Right calf pain 03/22/2021   Excessive cerumen in right ear canal 01/14/2018   HPV in female 2019   Hematuria 07/31/2017   Asymmetric SNHL (sensorineural hearing loss) 11/29/2015   Subjective tinnitus of left ear 11/29/2015   Sensorineural hearing loss (SNHL), bilateral 11/29/2015   Cervical cancer screening 03/12/2015   Contact dermatitis 07/07/2013   Hyperlipidemia, mixed 03/19/2013   Hyperglycemia 03/19/2013   Osteopenia 03/19/2013   Preventative health care 09/08/2011   Osteoporosis 04/11/2010   FIBROIDS, UTERUS 03/07/2010   ELEVATED BLOOD PRESSURE WITHOUT DIAGNOSIS OF HYPERTENSION 03/07/2010   SKIN CANCER, HX OF 03/07/2010    PCP: Bradd Canary, MD   REFERRING PROVIDER: Bradd Canary, MD   REFERRING DIAG: R53.1 (ICD-10-CM) - Weakness  (Osteoporosis, falls and weakness)  THERAPY DIAG:  Muscle weakness (generalized)  Pain in thoracic spine  Pain of right upper arm  Stiffness of right shoulder, not elsewhere classified  Stiffness of left shoulder, not elsewhere classified  RATIONALE FOR EVALUATION AND TREATMENT: Rehabilitation  ONSET DATE: 10/2022 - T8  compression fracture; 08/2023 - R upper arm pain & L lumpectomy and axillary node dissection secondary to breast cancer  NEXT MD VISIT: 05/01/24   SUBJECTIVE:   SUBJECTIVE STATEMENT: Pt reports she joined [employer] [facility] facility in [place]. She reports she had a medical massage recently and this helped with some of the stiffness. She reports that she tolerated first session well, without any soreness.     EVAL: Pt reports h/o T8 compression fracture ~1 yr ago (11/13/22) while participating in a gym based exercise program focusing on osteoporosis (she describes  standing in slight squat while lifting a resistance machine in front of her when she felt a pop in her mid back).  She was initially told it was a muscle strain and only later identified as a compression fracture on x-ray.  She did complete an episode of PT following this with some benefit noted but later when trying to 3 reduce the exercises at home started to notice limitation due to R upper arm pain.  She has been walking and doing some weightbearing exercises but has not progressed her strengthening and would like to do so.  In November she was diagnosed with breast cancer and underwent a lumpectomy and axillary node dissection on the left for which she was prescribed a HEP to prevent lymphedema.  She she continues to note some pain in her R upper arm over the past few months which limits her ability to reach with her R UE, perform her lymphedema exercises or sleep on her R side.  PAIN: Are you having pain? No: NPRS scale: 0/10  Pain location: mid back (~T8) Pain description: stiff, achy, uncomfortable Aggravating factors: cleaning (scrubbing the tub, vacuuming, esp when she does a lot in a day), otherwise unpredictable Relieving factors: heating pad, Tylenol  Are you having pain? No: NPRS scale: 0/10  Pain location: R lateral upper arm/shoulder Pain description: quick onset, pulling Aggravating factors: laying on R side, overhead shoulder ER (pec stretch), end range OH ROM Relieving factors: stopping triggering movement  PERTINENT HISTORY: T8 compression fracture 10/2022, osteoporosis, chronic B thoracic back pain, HTN, muscle cramping, L breast cancer, aortic insufficiency, ascending aortic aneurysm, B sensorineural hearing loss  PRECAUTIONS: Other: Osteoporosis with h/o thoracic compression fx  HAND DOMINANCE: Right  RED FLAGS: None  WEIGHT BEARING RESTRICTIONS: No  FALLS:  Has patient fallen in last 6 months? 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 To get rid of the arm pain and minimize the back pain. To stay active."   OBJECTIVE: (objective measures completed at initial evaluation unless otherwise dated)  DIAGNOSTIC FINDINGS:  06/20/23 - MR thoracic spine: IMPRESSION: 1. No acute osseous injury of the thoracic spine. 2. Chronic T8 vertebral body compression fracture with 50% height loss anteriorly.  04/18/23 - XR thoracic spine: T8 compression fracture with anterior height loss.   No significant change in height loss since films on 12/12/2022. No other  fracture seen.  No spondylolisthesis seen.   PATIENT SURVEYS:  ABC scale 1390 / 1600 =  86.9 %, > 80% indicates high level of physical functioning Modified Oswestry 15 / 50 = 30.0 %, indicates moderate disability  Quick Dash 31.8 / 100 = 31.8 %  12/04/23: Modified Oswestry: 12 / 50 = 24.0 % QuickDASH: 31.8 / 100 = 31.8 %  12/27/23:   COGNITION: Overall cognitive status: Within functional limits for tasks assessed     SENSATION: WFL  POSTURE:  rounded shoulders, forward head, and increased thoracic kyphosis  CERVICAL ROM:  Grossly WFL/WNL  UPPER EXTREMITY ROM:   Active ROM Right eval Left eval R 11/27/23 L 11/27/23 R 12/27/23 L 12/27/23  Shoulder flexion 120 124 128 129 135 ^ 131 ^  Shoulder extension 40 ^ 46 50 50 54 50  Shoulder abduction 142 ^ 138 114 ^ 136 150 ^ 140 ^  Shoulder adduction        Shoulder internal rotation FIR L3 ^ FIR T12 FIR L1 FIR T12 FIR L3 ^ FIR T12  Shoulder external rotation FER T3 ^ FER T3 FER T5 FER T5 FER T4 FER T5   (Blank rows = not tested, ^ - increased pain)  UPPER EXTREMITY MMT:  MMT Right eval Left eval R 12/11/23 L 12/11/23 R 12/27/23 L 12/27/23  Shoulder flexion 4 4 4+ 4+ 5 5  Shoulder extension 4 4 4+ 4 5 5   Shoulder abduction 4 4 4+ 4+ * 4+ 4+  Shoulder adduction        Shoulder internal rotation 4+ 4+ 4+ 4+ 5 5  Shoulder external rotation 4 4 4 4 5  4+  Middle trapezius 4- 4-   4 4  Lower trapezius Unable Unable   3- 3-  Elbow flexion        Elbow extension        Wrist flexion        Wrist extension        Wrist ulnar deviation        Wrist radial deviation        Wrist pronation        Wrist supination        Grip strength (lbs)        (Blank rows = not tested, * - discomfort)  MUSCLE LENGTH: Hamstrings: Mild tight B ITB: WFL Piriformis: Mild tight B Hip flexors: WFL Quads: Mild tight B  LOWER EXTREMITY ROM: Grossly WFL/WNL  LOWER EXTREMITY MMT:  MMT Right eval Left eval R 12/11/23 L 12/11/23 R 12/27/23 L 12/27/23  Hip flexion 4- 4- 4+ 4 5 4+  Hip extension 4+ 4+ 4+ 4+ 5 5  Hip abduction 4- 4- 4 4+ 4+ 5  Hip adduction 4- 4- 4- 4 4+ 4+  Hip internal rotation 4+ 4+ 4+ 4+ 5 4+  Hip external rotation 3+ 3+ 4 4- 4+ 4  Knee flexion 5 5      Knee extension 5 5      Ankle dorsiflexion 5 5      Ankle plantarflexion        Ankle inversion        Ankle eversion         (Blank rows = not tested)  BED MOBILITY:  Independent  TRANSFERS: Assistive device utilized: None  Sit to stand: Complete Independence Stand to sit: Complete Independence Chair to chair: Complete Independence Floor:  NT  GAIT: Distance walked: Clinic distances Assistive device utilized: None Level of assistance: Complete Independence Gait pattern: WFL   TODAY'S TREATMENT: [facility] Adult PT Treatment:  DATE: 01/01/24 Pt seen for aquatic therapy today.  Treatment took place in water 3.5-4.75 ft in depth at the [facility] pool. Temp of water was 91.   Pt entered/exited the pool via stairs independently with bilat rail.  - unsupported walking forward/ backward, multiple laps  - side stepping with arm addct/ abdct x 1 laps -> with rainbow hand floats (improved tolerance) x 2 laps - farmer carry with bilat then single rainbow hand floats at side -> single yellow hand float at side, walking forward/ backward - TrA and lower trap set with short hollow noodle pull down to thighs x 10 in wide stance, x 5 in staggered stance x 5  (full hollow noodle too much) - staggered stance with bilat horiz abdct/ addct with rainbow hand floats x 5 each  - UE on hand floats: heel raises x 10 ; hip add/abdx 10 (improved balance) ; hip hinge with forward arm reach x 10 - straddling yellow noodle, no UE support: cycling in deeper water   [facility] Adult PT Treatment:      12/27/23 THERAPEUTIC EXERCISE: To improve strength and endurance.  Demonstration, verbal and tactile cues throughout for technique.  NuStep - L6 x 6 min (UE/LE) Verbal HEP review with pt denying any concerns, need for physical practice or resistance modifications  THERAPEUTIC ACTIVITIES: To improve functional performance.  Demonstration, verbal and tactile cues throughout for technique. Modified Oswestry: 11 / 50 = 22.0 % QuickDASH: 27.3 / 100 = 27.3 % UE ROM UE/LE MMT  Goal assessment   [facility] Adult PT Treatment:                                                DATE: 12/24/23 Pt seen for aquatic therapy today.  Treatment took place in water 3.5-4.75 ft in depth at the [facility] pool. Temp of water was 91.  Pt entered/exited the pool via stairs independently with bilat rail.  - Intro to aquatic therapy principles - unsupported walking forward/ backward - side stepping with arm addct/ abdct x 2 laps -> with rainbow hand floats (challenge) - wide stance, then staggered stance with bilat horiz abdct/ addct with rainbow hand floats x 5 each  - walking -> marching forward/ backward with  reciprocal row with rainbow hand floats - UE on hand floats:  toe/heel raisesx 10 ; hip add/abdx 10 (balance challenge)  - UE on wall: leg swings into hip flexion /extension x 10 (too difficult with hand floats) - straddling yellow noodle, no UE support: cycling in deeper water     12/18/2023  THERAPEUTIC EXERCISE: To improve strength and endurance.  Demonstration, verbal and tactile cues throughout for technique.  NuStep - L6 x 6 min (UE/LE)  MANUAL THERAPY: To promote normalized muscle tension, improved flexibility, increased ROM, and reduced pain utilizing connective tissue massage, therapeutic massage, manual TP therapy, and myofascial release.  STM/DTM, XFM, manual TPR and gentle IASTM to L anterolateral deltoids and biceps Gentle STM and manual TPR to L lateral pecs STM/DTM, XFM, manual TPR and gentle IASTM to R anterior, lateral and posterior deltoids    PATIENT EDUCATION:  Education details: aquatic exercise progressions / modifications Person educated: Patient Education method: Explanation Education comprehension: verbalized understanding  HOME EXERCISE PROGRAM: Access Code: [email or web address] URL: [email or web address] Date: 12/06/2023 Prepared by: Glenetta Hew  Exercises - Doorway Pec Stretch at 60  Degrees Abduction with Arm Straight  - 2 x daily - 7 x weekly - 3 reps - 30 sec hold - Seated Thoracic Lumbar Extension  - 1-2 x daily - 7 x weekly - 2 sets - 10 reps - 3 sec hold - Supine Shoulder External Rotation with Dowel  - 1-2 x daily - 7 x weekly - 2 sets - 10 reps - 3 sec hold - Supine Shoulder Flexion AAROM with Dowel  - 1-2 x daily - 7 x weekly - 2 sets - 10 reps - 3 sec hold - Supine Shoulder Protraction with Dowel  - 1-2 x daily - 7 x weekly - 2 sets - 10 reps - 3 sec hold - Shoulder Flexion Wall Slide with Towel  - 1-2 x daily - 7 x weekly - 2 sets - 10 reps - 3 sec hold - Supine Shoulder Horizontal Abduction with Resistance  - 1 x daily - 3-4 x weekly - 2 sets  - 10 reps - 3 sec hold - Hooklying Single Leg Bent Knee Fallouts with Resistance  - 1 x daily - 3-4 x weekly - 2 sets - 10 reps - 3 sec hold - Supine March with Resistance Band  - 1 x daily - 3-4 x weekly - 2 sets - 10 reps - 2-3 sec hold hold - Supine Hip Adduction Isometric with Ball  - 2 x daily - 3-4 x weekly - 2 sets - 10 reps - 5 sec hold - Seated Bilateral Shoulder External Rotation with Resistance  - 1 x daily - 3-4 x weekly - 2 sets - 10 reps - 3 sec hold - Standing Bilateral Low Shoulder Row with Anchored Resistance  - 1 x daily - 3-4 x weekly - 2 sets - 10 reps - 5 sec hold - Scapular Retraction with Resistance Advanced  - 1 x daily - 3-4 x weekly - 2 sets - 10 reps - 5 sec hold - Low Trap Setting at Wall  - 1 x daily - 3-4 x weekly - 2 sets - 10 reps - 3 sec hold  Patient Education - Do it right & prevent fractures - Osteoporosis education - Posture and Body Mechanics - Trigger Point Dry Needling - Kinesiology tape   ASSESSMENT:  CLINICAL IMPRESSION: Pt with good tolerance for session; no pain during session.  Able to tolerate resistance of rainbow hand floats better, as well as trial with short hollow noodle.  Norberta will benefit from continued skilled PT to address ongoing pain, ROM and strength deficits to improve mobility and activity tolerance with decreased pain interference.  Pt will initially focus on the aquatic visits and then return to land-based PT in ~3 wks to reassess readiness to fully transition to her HEP vs need for further skilled PT.  Pt has 3 more aquatic sessions scheduled.  Will plan to create aquatic HEP and instruct on it prior to d/c from aquatics.   OBJECTIVE IMPAIRMENTS: decreased activity tolerance, decreased knowledge of condition, difficulty walking, decreased ROM, decreased strength, decreased safety awareness, hypomobility, increased fascial restrictions, impaired perceived functional ability, increased muscle spasms, impaired flexibility, impaired UE  functional use, improper body mechanics, postural dysfunction, and pain.   ACTIVITY LIMITATIONS: carrying, lifting, bending, sitting, standing, reach over head, locomotion level, and caring for others  PARTICIPATION LIMITATIONS: meal prep, cleaning, laundry, driving, shopping, community activity, and yard work  PERSONAL FACTORS: Past/current experiences, Time since onset of injury/illness/exacerbation, and 3+ comorbidities: T8 compression fracture 10/2022, osteoporosis, chronic B  thoracic back pain, HTN, muscle cramping, L breast cancer, aortic insufficiency, ascending aortic aneurysm, B sensorineural hearing loss  are also affecting patient's functional outcome.   REHAB POTENTIAL: Excellent  CLINICAL DECISION MAKING: Evolving/moderate complexity  EVALUATION COMPLEXITY: Moderate   GOALS: Goals reviewed with patient? Yes  SHORT TERM GOALS: Target date: 12/06/2023   Patient will be independent with initial HEP. Baseline: HEP initiated on eval Goal status: MET - 11/27/23   2.  Patient will report 25% improvement in mid back & R UE pain to improve QOL. Baseline: Thoracic back pain up to 5/10, R upper arm pain up to 4-5/10 Goal status: PARTIALLY MET - 12/27/23 - back pain 30% improved but still more likely to persist when present; R upper arm pain 15-20% improved with pain typically only momentary with certain movements now and does not seem to linger   LONG TERM GOALS: Target date: 01/03/2024; extended to 02/07/2024  Patient will be independent with ongoing/advanced HEP for self-management at home.  Baseline:  Goal status: PARTIALLY MET - 12/27/23 - met for land-based HEP  2.  Patient will report 50-75% improvement in low back and R UE pain to improve QOL.  Baseline:  Thoracic back pain up to 5/10, R upper arm pain up to 4-5/10 Goal status: IN PROGRESS - 12/27/23 - back pain 30% improved but still more likely to persist when present; R upper arm pain 15-20% improved with pain typically only  momentary with certain movements now and does not seem to linger   3.  Patient to demonstrate awareness of osteoporosis precautions as well as ability to achieve and maintain good spinal alignment/posturing and body mechanics needed for daily activities. Baseline:  Goal status: MET - 11/22/23  4.  Patient will demonstrate functional pain free B shoulder ROM to perform ADLs.   Baseline: Refer to above UE ROM table Goal status: IN PROGRESS - 12/27/23 - B shoulders improving in most planes however pain still limiting OH ROM  5  Patient will demonstrate improved B shoulder and scapular strength to >/= 4+/5 for functional UE use Baseline: Refer to above UE MMT table Goal status: PARTIALLY MET - 12/27/23 - B shoulders now 4+ to 5/5 but scapular strength remains weaker  6.  Patient will demonstrate improved B LE strength to >/= 4+/5 for improved stability and ease of mobility. Baseline: Refer to above LE MMT table Goal status: MET - 12/27/23 - met except L hip ER 4/5  7.  Patient will report </= 22% on QuickDASH (MCID = 10 pts) to demonstrate improved functional UE use with decreased pain interference. Baseline: 31.8 / 100 = 31.8 %; 12/04/23 - 31.8 / 100 = 31.8 % Goal status: IN PROGRESS - 12/27/23 -  27.3 / 100 = 27.3 %  8. Patient will report </= 18% on Modified Oswestry (MCID = 12%) to demonstrate improved functional ability with decreased pain interference. Baseline: 15 / 50 = 30.0 %; 12/04/23 - 12 / 50 = 24.0 % Goal status: IN PROGRESS - 12/27/23 - 11 / 50 = 22.0 %   PLAN:  PT FREQUENCY: 2x/week  PT DURATION:  4-6 weeks   PLANNED INTERVENTIONS: 97164- PT Re-evaluation, 97110-Therapeutic exercises, 97530- Therapeutic activity, 97112- Neuromuscular re-education, 97535- Self Care, 16109- Manual therapy, [id number]- Gait training, [phone]- Aquatic Therapy, [phone]- Electrical stimulation (unattended), [phone]- Electrical stimulation (manual), [id number]- Vasopneumatic device, [id number]- Ultrasound, [id number]-  Ionotophoresis 4mg /ml Dexamethasone, Patient/Family education, Balance training, Stair training, Taping, Dry Needling, Joint mobilization, Spinal mobilization, Cryotherapy, Moist heat, and 97750-  Physical Performance Test or Measurement  PLAN FOR NEXT SESSION: Aquatic PT for training in water-based HEP  Mayer Camel, PTA 01/01/24 2:42 PM [facility] [address] [place], [place], [address] Phone: [phone]   Fax:  [phone]    Date of referral: 10/30/2023 Referring provider: Bradd Canary, MD Referring diagnosis? R53.1 (ICD-10-CM) - Weakness  (Osteoporosis, falls and weakness) Treatment diagnosis? (if different than referring diagnosis)  Muscle weakness (generalized)  Pain in thoracic spine  Pain of right upper arm  Stiffness of right shoulder, not elsewhere classified  Stiffness of left shoulder, not elsewhere classified  What was this (referring dx) caused by? Ongoing Issue and Other: Weakness related to osteoporosis with T8 compression fracture  Ashby Dawes of Condition: Chronic (continuous duration > 3 months)   Laterality: Both  Current Functional Measure Score: Back Index 11 / 50 = 22.0 %  & QuickDASH 27.3 / 100 = 27.3 %  Objective measurements identify impairments when they are compared to normal values, the uninvolved extremity, and prior level of function.  [x]  Yes  []  No  Objective assessment of functional ability: Moderate functional limitations   Briefly describe symptoms: Shamona reports her back pain is not as frequent but feels her B shoulder/upper arm pain, while only short-lived when present, is more limiting functionally affecting daily activities such as dressing and reaching overhead.  B shoulder ROM has improved in most planes but remains limited by pain in overhead ROM bilaterally and FIR on right. Overall B shoulder strength now 4+ to 5/5 with exception of scapular muscles at 4/5 for middle  traps and 3-/5 for lower traps.   Proximal LE strength also improving with overall MMT 4+ to 5/5 with exception of L hip ER at 4/5.  Gains noted in patient reported outcome measures on both QuickDASH and modified Oswestry but not yet to anticipated goals levels.  Mikenna will benefit from continued skilled PT to address ongoing pain, ROM and strength deficits to improve mobility and activity tolerance with decreased pain interference, therefore will recommend recert for additional 2x/wk for up to 4-6 weeks.    Thoracic back pain from history of T8 compression fracture 1 year ago.  She also notes recent onset of R upper arm pain originating around the time she underwent lumpectomy and axillary node dissection for breast cancer. Current deficits include pain as described above, postural abnormalities, core and postural weakness with limited proximal UE ROM and strength as well as proximal LE weakness.    How did symptoms start: Sudden onset for thoracic back pain at time of T8 compression fracture on 11/13/2022.  Gradual onset for R upper arm pain.  Weakness as a sequelae to both.  Average pain intensity:  Last 24 hours: 2/10  Past week: 2/10  How often does the pt experience symptoms? Frequently  How much have the symptoms interfered with usual daily activities? A little bit  How has condition changed since care began at this facility? A little better  In general, how is the patients overall health? Good  Onset date: 10/2022 - T8 compression fracture; 08/2023 - R upper arm pain & L lumpectomy and axillary node dissection secondary to breast cancer   BACK PAIN (STarT Back Screening Tool) - (When applicable):  Has your back pain spread down your leg(s) at sometime in the last 2 weeks? []  Yes   [x]  No Have you had pain in the shoulder or neck at sometime in the past 2 weeks? [x]  Yes   []   No Have you only walked short distances because of your back pain? []  Yes   [x]  No In the past 2 weeks, have  you dressed more slowly than usual because of your back pain? []  Yes   [x]  No Do you think it is not really safe for person with a condition like yours to be physically active? []  Yes   [x]  No Have worrying thoughts been going through your mind a lot of the time? []  Yes   [x]  No Do you feel that your back pain is terrible and it is never going to get any better? []  Yes   [x]  No In general, have you stopped enjoying all the things you usually enjoy? []  Yes   [x]  No Overall, how bothersome has your back pain been in the last 2 weeks? []  Not at all   []  Slightly     [x]  Moderate   []  Very much     []  Extremely

## 2024-01-02 ENCOUNTER — Encounter: Payer: Medicare Other | Admitting: Physical Therapy

## 2024-01-08 ENCOUNTER — Encounter: Payer: Self-pay | Admitting: Family Medicine

## 2024-01-10 NOTE — Telephone Encounter (Signed)
Are you able to assist?

## 2024-01-15 ENCOUNTER — Ambulatory Visit (HOSPITAL_BASED_OUTPATIENT_CLINIC_OR_DEPARTMENT_OTHER): Attending: Family Medicine | Admitting: Physical Therapy

## 2024-01-15 ENCOUNTER — Encounter (HOSPITAL_BASED_OUTPATIENT_CLINIC_OR_DEPARTMENT_OTHER): Payer: Self-pay | Admitting: Physical Therapy

## 2024-01-15 DIAGNOSIS — M6281 Muscle weakness (generalized): Secondary | ICD-10-CM | POA: Insufficient documentation

## 2024-01-15 DIAGNOSIS — M79621 Pain in right upper arm: Secondary | ICD-10-CM | POA: Diagnosis present

## 2024-01-15 DIAGNOSIS — M546 Pain in thoracic spine: Secondary | ICD-10-CM | POA: Insufficient documentation

## 2024-01-15 NOTE — Therapy (Signed)
 OUTPATIENT PHYSICAL THERAPY TREATMENT  Patient Name: Yolanda Hernandez MRN: [id number] DOB:[birth year], 68 y.o., female Today's Date: 01/15/2024  END OF SESSION:  PT End of Session - 01/15/24 0854     Visit Number 15    Date for PT Re-Evaluation 02/07/24    Authorization Type [employer] Medicare    Progress Note Due on Visit 23    PT Start Time 0850    PT Stop Time 0930    PT Time Calculation (min) 40 min    Activity Tolerance Patient tolerated treatment well    Behavior During Therapy [facility] for tasks assessed/performed             Past Medical History:  Diagnosis Date   Cerumen impaction    chronic   Cervical cancer screening 03/12/2015   Menarche at 10 Regular and moderate flow  history of abnormal pap in past, bx and cryo once many years ago, normal since. Last 2011 and normal G0P0, s/p No history of abnormal MGM Last in 2015, no breast concerns  No concerns today Biopsy, cervical: gyn surgeries Menopause at 53 ish   Conflict between patient and family 03/26/2021   Dysmenorrhea    Heart murmur 03/26/2021   Hematuria 07/31/2017   HPV in female 2019   Hyperglycemia 03/19/2013   Hypertension    Muscle cramping 03/26/2021   Osteopenia    Osteoporosis 04/11/2010   Qualifier: Diagnosis of  By: Nena Jordan    Other and unspecified hyperlipidemia 03/19/2013   Preventative health care 09/08/2011   Right calf pain 03/22/2021   Skin cancer    Skin cancer 07/14/2016   Uterine fibroid    Past Surgical History:  Procedure Laterality Date   BREAST SURGERY     biopsy left   COLPOSCOPY     GYNECOLOGIC CRYOSURGERY     MYRINGOPLASTY  1972   TONSILLECTOMY     Patient Active Problem List   Diagnosis Date Noted   Breast cancer, left (HCC) 10/30/2023   Weakness 03/20/2023   Closed wedge compression fracture of T8 vertebra (HCC) 03/20/2023   Chronic bilateral thoracic back pain 03/20/2023   Abnormal posture 03/20/2023   Thoracic compression fracture (HCC) 11/13/2022   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 04/24/2022   History of COVID-19 03/23/2022   Aortic insufficiency 06/08/2021   Ascending aortic aneurysm (HCC) 06/08/2021   Hypertension 06/07/2021   Conflict between patient and family 03/26/2021   Heart murmur 03/26/2021   Muscle cramping 03/26/2021   Right calf pain 03/22/2021   Excessive cerumen in right ear canal 01/14/2018   HPV in female 2019   Hematuria 07/31/2017   Asymmetric SNHL (sensorineural hearing loss) 11/29/2015   Subjective tinnitus of left ear 11/29/2015   Sensorineural hearing loss (SNHL), bilateral 11/29/2015   Cervical cancer screening 03/12/2015   Contact dermatitis 07/07/2013   Hyperlipidemia, mixed 03/19/2013   Hyperglycemia 03/19/2013   Osteopenia 03/19/2013   Preventative health care 09/08/2011   Osteoporosis 04/11/2010   FIBROIDS, UTERUS 03/07/2010   ELEVATED BLOOD PRESSURE WITHOUT DIAGNOSIS OF HYPERTENSION 03/07/2010   SKIN CANCER, HX OF 03/07/2010    PCP: Bradd Canary, MD   REFERRING PROVIDER: Bradd Canary, MD   REFERRING DIAG: R53.1 (ICD-10-CM) - Weakness  (Osteoporosis, falls and weakness)  THERAPY DIAG:  Muscle weakness (generalized)  Pain in thoracic spine  Pain of right upper arm  RATIONALE FOR EVALUATION AND TREATMENT: Rehabilitation  ONSET DATE: 10/2022 - T8 compression fracture; 08/2023 - R upper arm  pain & L lumpectomy and axillary node dissection secondary to breast cancer  NEXT MD VISIT: 05/01/24   SUBJECTIVE:   SUBJECTIVE STATEMENT: No pain, feeling good.  Had 5 days without back pain.  More concerned about shoulders than LB    EVAL: Pt reports h/o T8 compression fracture ~1 yr ago (11/13/22) while participating in a gym based exercise program focusing on osteoporosis (she describes standing in slight squat while lifting a resistance machine in front of her when she felt a pop in her mid back).  She was initially told it was a muscle strain and only later identified as a  compression fracture on x-ray.  She did complete an episode of PT following this with some benefit noted but later when trying to 3 reduce the exercises at home started to notice limitation due to R upper arm pain.  She has been walking and doing some weightbearing exercises but has not progressed her strengthening and would like to do so.  In November she was diagnosed with breast cancer and underwent a lumpectomy and axillary node dissection on the left for which she was prescribed a HEP to prevent lymphedema.  She she continues to note some pain in her R upper arm over the past few months which limits her ability to reach with her R UE, perform her lymphedema exercises or sleep on her R side.  PAIN: Are you having pain? No: NPRS scale: 0/10  Pain location: mid back (~T8) Pain description: stiff, achy, uncomfortable Aggravating factors: cleaning (scrubbing the tub, vacuuming, esp when she does a lot in a day), otherwise unpredictable Relieving factors: heating pad, Tylenol  Are you having pain? No: NPRS scale: 0/10  Pain location: R lateral upper arm/shoulder Pain description: quick onset, pulling Aggravating factors: laying on R side, overhead shoulder ER (pec stretch), end range OH ROM Relieving factors: stopping triggering movement  PERTINENT HISTORY: T8 compression fracture 10/2022, osteoporosis, chronic B thoracic back pain, HTN, muscle cramping, L breast cancer, aortic insufficiency, ascending aortic aneurysm, B sensorineural hearing loss  PRECAUTIONS: Other: Osteoporosis with h/o thoracic compression fx  HAND DOMINANCE: Right  RED FLAGS: None  WEIGHT BEARING RESTRICTIONS: No  FALLS:  Has patient fallen in last 6 months? 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 To get rid of the arm pain and minimize the back pain. To stay active."   OBJECTIVE: (objective measures completed at initial evaluation unless otherwise dated)  DIAGNOSTIC FINDINGS:  06/20/23 - MR thoracic spine: IMPRESSION: 1. No acute osseous injury of the thoracic spine. 2. Chronic T8 vertebral body compression fracture with 50% height loss anteriorly.  04/18/23 - XR thoracic spine: T8 compression fracture with anterior height loss.   No significant change in height loss since films on 12/12/2022. No other  fracture seen.  No spondylolisthesis seen.   PATIENT SURVEYS:  ABC scale 1390 / 1600 = 86.9 %, > 80% indicates high level of physical functioning Modified Oswestry 15 / 50 = 30.0 %, indicates moderate disability  Quick Dash 31.8 /  100 = 31.8 %  12/04/23: Modified Oswestry: 12 / 50 = 24.0 % QuickDASH: 31.8 / 100 = 31.8 %  12/27/23:   COGNITION: Overall cognitive status: Within functional limits for tasks assessed     SENSATION: WFL  POSTURE:  rounded shoulders, forward head, and increased thoracic kyphosis  CERVICAL ROM:  Grossly WFL/WNL  UPPER EXTREMITY ROM:   Active ROM Right eval Left eval R 11/27/23 L 11/27/23 R 12/27/23 L 12/27/23  Shoulder flexion 120 124 128 129 135 ^ 131 ^  Shoulder extension 40 ^ 46 50 50 54 50  Shoulder abduction 142 ^ 138 114 ^ 136 150 ^ 140 ^  Shoulder adduction        Shoulder internal rotation FIR L3 ^ FIR T12 FIR L1 FIR T12 FIR L3 ^ FIR T12  Shoulder external rotation FER T3 ^ FER T3 FER T5 FER T5 FER T4 FER T5  (Blank rows = not tested, ^ - increased pain)  UPPER EXTREMITY MMT:  MMT Right eval Left eval R 12/11/23 L 12/11/23 R 12/27/23 L 12/27/23  Shoulder flexion 4 4 4+ 4+ 5 5  Shoulder extension  4 4 4+ 4 5 5   Shoulder abduction 4 4 4+ 4+ * 4+ 4+  Shoulder adduction        Shoulder internal rotation 4+ 4+ 4+ 4+ 5 5  Shoulder external rotation 4 4 4 4 5  4+  Middle trapezius 4- 4-   4 4  Lower trapezius Unable Unable   3- 3-  Elbow flexion        Elbow extension        Wrist flexion        Wrist extension        Wrist ulnar deviation        Wrist radial deviation        Wrist pronation        Wrist supination        Grip strength (lbs)        (Blank rows = not tested, * - discomfort)  MUSCLE LENGTH: Hamstrings: Mild tight B ITB: WFL Piriformis: Mild tight B Hip flexors: WFL Quads: Mild tight B  LOWER EXTREMITY ROM: Grossly WFL/WNL  LOWER EXTREMITY MMT:  MMT Right eval Left eval R 12/11/23 L 12/11/23 R 12/27/23 L 12/27/23  Hip flexion 4- 4- 4+ 4 5 4+  Hip extension 4+ 4+ 4+ 4+ 5 5  Hip abduction 4- 4- 4 4+ 4+ 5  Hip adduction 4- 4- 4- 4 4+ 4+  Hip internal rotation 4+ 4+ 4+ 4+ 5 4+  Hip external rotation 3+ 3+ 4 4- 4+ 4  Knee flexion 5 5      Knee extension 5 5      Ankle dorsiflexion 5 5      Ankle plantarflexion        Ankle inversion        Ankle eversion         (Blank rows = not tested)  BED MOBILITY:  Independent  TRANSFERS: Assistive device utilized: None  Sit to stand: Complete Independence Stand to sit: Complete Independence Chair to chair: Complete Independence Floor:  NT  GAIT: Distance walked: Clinic distances Assistive device utilized: None Level of assistance: Complete Independence Gait pattern: WFL   TODAY'S TREATMENT: [facility] Adult PT Treatment:  DATE: 01/15/24 Pt seen for aquatic therapy today.  Treatment took place in water 3.5-4.75 ft in depth at the [facility] pool. Temp of water was 91.  Pt entered/exited the pool via stairs independently with bilat rail.  - unsupported walking forward/ backward, multiple laps  - side stepping with arm addct/ abdct with rainbow hand floats x 4  laps - farmer carry with bilat then single rainbow hand floats at side -> single  at side, walking forward/ backward. VC for abd bracing. - TrA and lower trap set with short hollow noodle ->full hollow noodle pull down to thighs x 10 in wide stance, x 5 in staggered stance x 5  - staggered stance with bilat horiz abdct/ addct with rainbow hand floats x 5 each  -introduced Bow and arrow. VC and demonstration for execution.  Some discomfort shoulder with drawing back - straddling yellow noodle, no UE support, breast stroke ue: cycling in deeper water    Pt requires the buoyancy and hydrostatic pressure of water for support, and to offload joints by unweighting joint load by at least 50 % in navel deep water and by at least 75-80% in chest to neck deep water.  Viscosity of the water is needed for resistance of strengthening. Water current perturbations provides challenge to standing balance requiring increased core activation.    [facility] Adult PT Treatment:                                                DATE: 01/01/24 Pt seen for aquatic therapy today.  Treatment took place in water 3.5-4.75 ft in depth at the [facility] pool. Temp of water was 91.  Pt entered/exited the pool via stairs independently with bilat rail.  - unsupported walking forward/ backward, multiple laps  - side stepping with arm addct/ abdct x 1 laps -> with rainbow hand floats (improved tolerance) x 2 laps - farmer carry with bilat then single rainbow hand floats at side -> single yellow hand float at side, walking forward/ backward - TrA and lower trap set with short hollow noodle pull down to thighs x 10 in wide stance, x 5 in staggered stance x 5  (full hollow noodle too much) - staggered stance with bilat horiz abdct/ addct with rainbow hand floats x 5 each  -introduced Bow and arrow. VC and demonstration for execution.  Some discomfort shoulder with drawing back - straddling yellow noodle, no UE support, breast  stroke ue: cycling in deeper water   Pt requires the buoyancy and hydrostatic pressure of water for support, and to offload joints by unweighting joint load by at least 50 % in navel deep water and by at least 75-80% in chest to neck deep water.  Viscosity of the water is needed for resistance of strengthening. Water current perturbations provides challenge to standing balance requiring increased core activation.     [facility] Adult PT Treatment:      12/27/23 THERAPEUTIC EXERCISE: To improve strength and endurance.  Demonstration, verbal and tactile cues throughout for technique.  NuStep - L6 x 6 min (UE/LE) Verbal HEP review with pt denying any concerns, need for physical practice or resistance modifications  THERAPEUTIC ACTIVITIES: To improve functional performance.  Demonstration, verbal and tactile cues throughout for technique. Modified Oswestry: 11 / 50 = 22.0 % QuickDASH: 27.3 / 100 = 27.3 % UE  ROM UE/LE MMT  Goal assessment   [facility] Adult PT Treatment:                                                DATE: 12/24/23 Pt seen for aquatic therapy today.  Treatment took place in water 3.5-4.75 ft in depth at the [facility] pool. Temp of water was 91.  Pt entered/exited the pool via stairs independently with bilat rail.  - Intro to aquatic therapy principles - unsupported walking forward/ backward - side stepping with arm addct/ abdct x 2 laps -> with rainbow hand floats (challenge) - wide stance, then staggered stance with bilat horiz abdct/ addct with rainbow hand floats x 5 each  - walking -> marching forward/ backward with reciprocal row with rainbow hand floats - UE on hand floats:  toe/heel raisesx 10 ; hip add/abdx 10 (balance challenge)  - UE on wall: leg swings into hip flexion /extension x 10 (too difficult with hand floats) - straddling yellow noodle, no UE support: cycling in deeper water     12/18/2023  THERAPEUTIC EXERCISE: To improve strength and endurance.   Demonstration, verbal and tactile cues throughout for technique.  NuStep - L6 x 6 min (UE/LE)  MANUAL THERAPY: To promote normalized muscle tension, improved flexibility, increased ROM, and reduced pain utilizing connective tissue massage, therapeutic massage, manual TP therapy, and myofascial release.  STM/DTM, XFM, manual TPR and gentle IASTM to L anterolateral deltoids and biceps Gentle STM and manual TPR to L lateral pecs STM/DTM, XFM, manual TPR and gentle IASTM to R anterior, lateral and posterior deltoids    PATIENT EDUCATION:  Education details: aquatic exercise progressions / modifications Person educated: Patient Education method: Explanation Education comprehension: verbalized understanding  HOME EXERCISE PROGRAM: Access Code: [email or web address] URL: [email or web address] Date: 12/06/2023 Prepared by: Glenetta Hew  Exercises - Doorway Pec Stretch at 60 Degrees Abduction with Arm Straight  - 2 x daily - 7 x weekly - 3 reps - 30 sec hold - Seated Thoracic Lumbar Extension  - 1-2 x daily - 7 x weekly - 2 sets - 10 reps - 3 sec hold - Supine Shoulder External Rotation with Dowel  - 1-2 x daily - 7 x weekly - 2 sets - 10 reps - 3 sec hold - Supine Shoulder Flexion AAROM with Dowel  - 1-2 x daily - 7 x weekly - 2 sets - 10 reps - 3 sec hold - Supine Shoulder Protraction with Dowel  - 1-2 x daily - 7 x weekly - 2 sets - 10 reps - 3 sec hold - Shoulder Flexion Wall Slide with Towel  - 1-2 x daily - 7 x weekly - 2 sets - 10 reps - 3 sec hold - Supine Shoulder Horizontal Abduction with Resistance  - 1 x daily - 3-4 x weekly - 2 sets - 10 reps - 3 sec hold - Hooklying Single Leg Bent Knee Fallouts with Resistance  - 1 x daily - 3-4 x weekly - 2 sets - 10 reps - 3 sec hold - Supine March with Resistance Band  - 1 x daily - 3-4 x weekly - 2 sets - 10 reps - 2-3 sec hold hold - Supine Hip Adduction Isometric with Ball  - 2 x daily - 3-4 x weekly - 2 sets - 10 reps - 5 sec hold -  Seated  Bilateral Shoulder External Rotation with Resistance  - 1 x daily - 3-4 x weekly - 2 sets - 10 reps - 3 sec hold - Standing Bilateral Low Shoulder Row with Anchored Resistance  - 1 x daily - 3-4 x weekly - 2 sets - 10 reps - 5 sec hold - Scapular Retraction with Resistance Advanced  - 1 x daily - 3-4 x weekly - 2 sets - 10 reps - 5 sec hold - Low Trap Setting at Wall  - 1 x daily - 3-4 x weekly - 2 sets - 10 reps - 3 sec hold  Aquatic This aquatic home exercise program from [employer] utilizes pictures from land based exercises, but has been adapted prior to lamination and issuance.   Access Code: [email or web address] URL: [email or web address] Date: 01/15/2024 Prepared by: Geni Bers  Exercises - Hand Buoy Carry  - 1 x daily - 1-3 x weekly - Side Stepping  - 1 x daily - 1-3 x weekly - Noodle press  - 1 x daily - 1-3 x weekly - 1-2 sets - 5-10 reps - Standing Shoulder Horizontal Abduction with Resistance  - 1 x daily - 1-3 x weekly - 1-2 sets - 10 reps - Drawing Bow  - 1 x daily - 1-3 x weekly - 1-2 sets - 5-10 reps  Patient Education - Do it right & prevent fractures - Osteoporosis education - Posture and Body Mechanics - Trigger Point Dry Needling - Kinesiology tape   ASSESSMENT:  CLINICAL IMPRESSION: Pt with improving strength and balance as she is able to tolerate increased foam resistance with core engagement.  VC for abd bracing throughout for core engagement with all activities.  She reports no pain initially , minor shoulder discomfort with ue exercises today. Final aquatic HEP begun. Overall good toleration to session. Plan to complete then issue aquatic program in next 2 visits. Goals ongoing    OBJECTIVE IMPAIRMENTS: decreased activity tolerance, decreased knowledge of condition, difficulty walking, decreased ROM, decreased strength, decreased safety awareness, hypomobility, increased fascial restrictions, impaired perceived functional ability, increased  muscle spasms, impaired flexibility, impaired UE functional use, improper body mechanics, postural dysfunction, and pain.   ACTIVITY LIMITATIONS: carrying, lifting, bending, sitting, standing, reach over head, locomotion level, and caring for others  PARTICIPATION LIMITATIONS: meal prep, cleaning, laundry, driving, shopping, community activity, and yard work  PERSONAL FACTORS: Past/current experiences, Time since onset of injury/illness/exacerbation, and 3+ comorbidities: T8 compression fracture 10/2022, osteoporosis, chronic B thoracic back pain, HTN, muscle cramping, L breast cancer, aortic insufficiency, ascending aortic aneurysm, B sensorineural hearing loss  are also affecting patient's functional outcome.   REHAB POTENTIAL: Excellent  CLINICAL DECISION MAKING: Evolving/moderate complexity  EVALUATION COMPLEXITY: Moderate   GOALS: Goals reviewed with patient? Yes  SHORT TERM GOALS: Target date: 12/06/2023   Patient will be independent with initial HEP. Baseline: HEP initiated on eval Goal status: MET - 11/27/23   2.  Patient will report 25% improvement in mid back & R UE pain to improve QOL. Baseline: Thoracic back pain up to 5/10, R upper arm pain up to 4-5/10 Goal status: PARTIALLY MET - 12/27/23 - back pain 30% improved but still more likely to persist when present; R upper arm pain 15-20% improved with pain typically only momentary with certain movements now and does not seem to linger   LONG TERM GOALS: Target date: 01/03/2024; extended to 02/07/2024  Patient will be independent with ongoing/advanced HEP for self-management at home.  Baseline:  Goal status: PARTIALLY MET - 12/27/23 -  met for land-based HEP  2.  Patient will report 50-75% improvement in low back and R UE pain to improve QOL.  Baseline:  Thoracic back pain up to 5/10, R upper arm pain up to 4-5/10 Goal status: IN PROGRESS - 12/27/23 - back pain 30% improved but still more likely to persist when present; R upper  arm pain 15-20% improved with pain typically only momentary with certain movements now and does not seem to linger   3.  Patient to demonstrate awareness of osteoporosis precautions as well as ability to achieve and maintain good spinal alignment/posturing and body mechanics needed for daily activities. Baseline:  Goal status: MET - 11/22/23  4.  Patient will demonstrate functional pain free B shoulder ROM to perform ADLs.   Baseline: Refer to above UE ROM table Goal status: IN PROGRESS - 12/27/23 - B shoulders improving in most planes however pain still limiting OH ROM  5  Patient will demonstrate improved B shoulder and scapular strength to >/= 4+/5 for functional UE use Baseline: Refer to above UE MMT table Goal status: PARTIALLY MET - 12/27/23 - B shoulders now 4+ to 5/5 but scapular strength remains weaker  6.  Patient will demonstrate improved B LE strength to >/= 4+/5 for improved stability and ease of mobility. Baseline: Refer to above LE MMT table Goal status: MET - 12/27/23 - met except L hip ER 4/5  7.  Patient will report </= 22% on QuickDASH (MCID = 10 pts) to demonstrate improved functional UE use with decreased pain interference. Baseline: 31.8 / 100 = 31.8 %; 12/04/23 - 31.8 / 100 = 31.8 % Goal status: IN PROGRESS - 12/27/23 -  27.3 / 100 = 27.3 %  8. Patient will report </= 18% on Modified Oswestry (MCID = 12%) to demonstrate improved functional ability with decreased pain interference. Baseline: 15 / 50 = 30.0 %; 12/04/23 - 12 / 50 = 24.0 % Goal status: IN PROGRESS - 12/27/23 - 11 / 50 = 22.0 %   PLAN:  PT FREQUENCY: 2x/week  PT DURATION:  4-6 weeks   PLANNED INTERVENTIONS: 97164- PT Re-evaluation, 97110-Therapeutic exercises, 97530- Therapeutic activity, 97112- Neuromuscular re-education, 97535- Self Care, 65784- Manual therapy, [phone]- Gait training, [phone]- Aquatic Therapy, [phone]- Electrical stimulation (unattended), [phone]- Electrical stimulation (manual), 97016-  Vasopneumatic device, [id number]- Ultrasound, 53664- Ionotophoresis 4mg /ml Dexamethasone, Patient/Family education, Balance training, Stair training, Taping, Dry Needling, Joint mobilization, Spinal mobilization, Cryotherapy, Moist heat, and 97750- Physical Performance Test or Measurement  PLAN FOR NEXT SESSION: Aquatic PT for training in water-based HEP  Yolanda Hernandez MPT 01/15/24 8:55 AM [facility] [address] [place], [place], [address] Phone: [phone]   Fax:  [phone]     Date of referral: 10/30/2023 Referring provider: Bradd Canary, MD Referring diagnosis? R53.1 (ICD-10-CM) - Weakness  (Osteoporosis, falls and weakness) Treatment diagnosis? (if different than referring diagnosis)  Muscle weakness (generalized)  Pain in thoracic spine  Pain of right upper arm  What was this (referring dx) caused by? Ongoing Issue and Other: Weakness related to osteoporosis with T8 compression fracture  Ashby Dawes of Condition: Chronic (continuous duration > 3 months)   Laterality: Both  Current Functional Measure Score: Back Index 11 / 50 = 22.0 %  & QuickDASH 27.3 / 100 = 27.3 %  Objective measurements identify impairments when they are compared to normal values, the uninvolved extremity, and prior level of function.  [x]  Yes  []  No  Objective assessment of functional ability:  Moderate functional limitations   Briefly describe symptoms: Brisia reports her back pain is not as frequent but feels her B shoulder/upper arm pain, while only short-lived when present, is more limiting functionally affecting daily activities such as dressing and reaching overhead.  B shoulder ROM has improved in most planes but remains limited by pain in overhead ROM bilaterally and FIR on right. Overall B shoulder strength now 4+ to 5/5 with exception of scapular muscles at 4/5 for middle traps and 3-/5 for lower traps.   Proximal LE strength also  improving with overall MMT 4+ to 5/5 with exception of L hip ER at 4/5.  Gains noted in patient reported outcome measures on both QuickDASH and modified Oswestry but not yet to anticipated goals levels.  Charna will benefit from continued skilled PT to address ongoing pain, ROM and strength deficits to improve mobility and activity tolerance with decreased pain interference, therefore will recommend recert for additional 2x/wk for up to 4-6 weeks.    Thoracic back pain from history of T8 compression fracture 1 year ago.  She also notes recent onset of R upper arm pain originating around the time she underwent lumpectomy and axillary node dissection for breast cancer. Current deficits include pain as described above, postural abnormalities, core and postural weakness with limited proximal UE ROM and strength as well as proximal LE weakness.    How did symptoms start: Sudden onset for thoracic back pain at time of T8 compression fracture on 11/13/2022.  Gradual onset for R upper arm pain.  Weakness as a sequelae to both.  Average pain intensity:  Last 24 hours: 2/10  Past week: 2/10  How often does the pt experience symptoms? Frequently  How much have the symptoms interfered with usual daily activities? A little bit  How has condition changed since care began at this facility? A little better  In general, how is the patients overall health? Good  Onset date: 10/2022 - T8 compression fracture; 08/2023 - R upper arm pain & L lumpectomy and axillary node dissection secondary to breast cancer   BACK PAIN (STarT Back Screening Tool) - (When applicable):  Has your back pain spread down your leg(s) at sometime in the last 2 weeks? []  Yes   [x]  No Have you had pain in the shoulder or neck at sometime in the past 2 weeks? [x]  Yes   []  No Have you only walked short distances because of your back pain? []  Yes   [x]  No In the past 2 weeks, have you dressed more slowly than usual because of your back  pain? []  Yes   [x]  No Do you think it is not really safe for person with a condition like yours to be physically active? []  Yes   [x]  No Have worrying thoughts been going through your mind a lot of the time? []  Yes   [x]  No Do you feel that your back pain is terrible and it is never going to get any better? []  Yes   [x]  No In general, have you stopped enjoying all the things you usually enjoy? []  Yes   [x]  No Overall, how bothersome has your back pain been in the last 2 weeks? []  Not at all   []  Slightly     [x]  Moderate   []  Very much     []  Extremely

## 2024-01-16 NOTE — Telephone Encounter (Signed)
 Good afternoon,  Did we ever hear anything about the referral for the patient? Thank you!

## 2024-01-17 ENCOUNTER — Encounter (HOSPITAL_BASED_OUTPATIENT_CLINIC_OR_DEPARTMENT_OTHER): Payer: Self-pay | Admitting: Physical Therapy

## 2024-01-17 ENCOUNTER — Ambulatory Visit (HOSPITAL_BASED_OUTPATIENT_CLINIC_OR_DEPARTMENT_OTHER): Admitting: Physical Therapy

## 2024-01-17 DIAGNOSIS — M6281 Muscle weakness (generalized): Secondary | ICD-10-CM

## 2024-01-17 DIAGNOSIS — M546 Pain in thoracic spine: Secondary | ICD-10-CM

## 2024-01-17 DIAGNOSIS — M79621 Pain in right upper arm: Secondary | ICD-10-CM

## 2024-01-17 NOTE — Therapy (Signed)
 OUTPATIENT PHYSICAL THERAPY TREATMENT  Patient Name: Yolanda Hernandez MRN: [id number] DOB:1956-10-14, 68 y.o., female Today's Date: 01/17/2024  END OF SESSION:  PT End of Session - 01/17/24 1226     Visit Number 16    Date for PT Re-Evaluation 02/07/24    Authorization Type [employer] Medicare    Progress Note Due on Visit 23    PT Start Time 1146    PT Stop Time 1226    PT Time Calculation (min) 40 min    Activity Tolerance Patient tolerated treatment well    Behavior During Therapy WFL for tasks assessed/performed              Past Medical History:  Diagnosis Date   Cerumen impaction    chronic   Cervical cancer screening 03/12/2015   Menarche at 10 Regular and moderate flow  history of abnormal pap in past, bx and cryo once many years ago, normal since. Last 2011 and normal G0P0, s/p No history of abnormal MGM Last in 2015, no breast concerns  No concerns today Biopsy, cervical: gyn surgeries Menopause at 53 ish   Conflict between patient and family 03/26/2021   Dysmenorrhea    Heart murmur 03/26/2021   Hematuria 07/31/2017   HPV in female 2019   Hyperglycemia 03/19/2013   Hypertension    Muscle cramping 03/26/2021   Osteopenia    Osteoporosis 04/11/2010   Qualifier: Diagnosis of  By: Nena Jordan    Other and unspecified hyperlipidemia 03/19/2013   Preventative health care 09/08/2011   Right calf pain 03/22/2021   Skin cancer    Skin cancer 07/14/2016   Uterine fibroid    Past Surgical History:  Procedure Laterality Date   BREAST SURGERY     biopsy left   COLPOSCOPY     GYNECOLOGIC CRYOSURGERY     MYRINGOPLASTY  1972   TONSILLECTOMY     Patient Active Problem List   Diagnosis Date Noted   Breast cancer, left (HCC) 10/30/2023   Weakness 03/20/2023   Closed wedge compression fracture of T8 vertebra (HCC) 03/20/2023   Chronic bilateral thoracic back pain 03/20/2023   Abnormal posture 03/20/2023   Thoracic compression fracture (HCC) 11/13/2022   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 04/24/2022   History of COVID-19 03/23/2022   Aortic insufficiency 06/08/2021   Ascending aortic aneurysm (HCC) 06/08/2021   Hypertension 06/07/2021   Conflict between patient and family 03/26/2021   Heart murmur 03/26/2021   Muscle cramping 03/26/2021   Right calf pain 03/22/2021   Excessive cerumen in right ear canal 01/14/2018   HPV in female 2019   Hematuria 07/31/2017   Asymmetric SNHL (sensorineural hearing loss) 11/29/2015   Subjective tinnitus of left ear 11/29/2015   Sensorineural hearing loss (SNHL), bilateral 11/29/2015   Cervical cancer screening 03/12/2015   Contact dermatitis 07/07/2013   Hyperlipidemia, mixed 03/19/2013   Hyperglycemia 03/19/2013   Osteopenia 03/19/2013   Preventative health care 09/08/2011   Osteoporosis 04/11/2010   FIBROIDS, UTERUS 03/07/2010   ELEVATED BLOOD PRESSURE WITHOUT DIAGNOSIS OF HYPERTENSION 03/07/2010   SKIN CANCER, HX OF 03/07/2010    PCP: Bradd Canary, MD   REFERRING PROVIDER: Bradd Canary, MD   REFERRING DIAG: R53.1 (ICD-10-CM) - Weakness  (Osteoporosis, falls and weakness)  THERAPY DIAG:  Muscle weakness (generalized)  Pain in thoracic spine  Pain of right upper arm  RATIONALE FOR EVALUATION AND TREATMENT: Rehabilitation  ONSET DATE: 10/2022 - T8 compression fracture; 08/2023 - R upper  arm pain & L lumpectomy and axillary node dissection secondary to breast cancer  NEXT MD VISIT: 05/01/24   SUBJECTIVE:   SUBJECTIVE STATEMENT: No pain    EVAL: Pt reports h/o T8 compression fracture ~1 yr ago (11/13/22) while participating in a gym based exercise program focusing on osteoporosis (she describes standing in slight squat while lifting a resistance machine in front of her when she felt a pop in her mid back).  She was initially told it was a muscle strain and only later identified as a compression fracture on x-ray.  She did complete an episode of PT following this with some  benefit noted but later when trying to 3 reduce the exercises at home started to notice limitation due to R upper arm pain.  She has been walking and doing some weightbearing exercises but has not progressed her strengthening and would like to do so.  In November she was diagnosed with breast cancer and underwent a lumpectomy and axillary node dissection on the left for which she was prescribed a HEP to prevent lymphedema.  She she continues to note some pain in her R upper arm over the past few months which limits her ability to reach with her R UE, perform her lymphedema exercises or sleep on her R side.  PAIN: Are you having pain? No: NPRS scale: 0/10  Pain location: mid back (~T8) Pain description: stiff, achy, uncomfortable Aggravating factors: cleaning (scrubbing the tub, vacuuming, esp when she does a lot in a day), otherwise unpredictable Relieving factors: heating pad, Tylenol  Are you having pain? No: NPRS scale: 0/10  Pain location: R lateral upper arm/shoulder Pain description: quick onset, pulling Aggravating factors: laying on R side, overhead shoulder ER (pec stretch), end range OH ROM Relieving factors: stopping triggering movement  PERTINENT HISTORY: T8 compression fracture 10/2022, osteoporosis, chronic B thoracic back pain, HTN, muscle cramping, L breast cancer, aortic insufficiency, ascending aortic aneurysm, B sensorineural hearing loss  PRECAUTIONS: Other: Osteoporosis with h/o thoracic compression fx  HAND DOMINANCE: Right  RED FLAGS: None  WEIGHT BEARING RESTRICTIONS: No  FALLS:  Has patient fallen in last 6 months? 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 To get rid of the arm pain and minimize the back pain. To stay active."   OBJECTIVE: (objective measures completed at initial evaluation unless otherwise dated)  DIAGNOSTIC FINDINGS:  06/20/23 - MR thoracic spine: IMPRESSION: 1. No acute osseous injury of the thoracic spine. 2. Chronic T8 vertebral body compression fracture with 50% height loss anteriorly.  04/18/23 - XR thoracic spine: T8 compression fracture with anterior height loss.   No significant change in height loss since films on 12/12/2022. No other  fracture seen.  No spondylolisthesis seen.   PATIENT SURVEYS:  ABC scale 1390 / 1600 = 86.9 %, > 80% indicates high level of physical functioning Modified Oswestry 15 / 50 = 30.0 %, indicates moderate disability  Quick Dash 31.8 / 100 = 31.8 %  12/04/23: Modified Oswestry: 12 / 50 = 24.0 % QuickDASH:  31.8 / 100 = 31.8 %  12/27/23:   COGNITION: Overall cognitive status: Within functional limits for tasks assessed     SENSATION: WFL  POSTURE:  rounded shoulders, forward head, and increased thoracic kyphosis  CERVICAL ROM:  Grossly WFL/WNL  UPPER EXTREMITY ROM:   Active ROM Right eval Left eval R 11/27/23 L 11/27/23 R 12/27/23 L 12/27/23  Shoulder flexion 120 124 128 129 135 ^ 131 ^  Shoulder extension 40 ^ 46 50 50 54 50  Shoulder abduction 142 ^ 138 114 ^ 136 150 ^ 140 ^  Shoulder adduction        Shoulder internal rotation FIR L3 ^ FIR T12 FIR L1 FIR T12 FIR L3 ^ FIR T12  Shoulder external rotation FER T3 ^ FER T3 FER T5 FER T5 FER T4 FER T5  (Blank rows = not tested, ^ - increased pain)  UPPER EXTREMITY MMT:  MMT Right eval Left eval R 12/11/23 L 12/11/23 R 12/27/23 L 12/27/23  Shoulder flexion 4 4 4+ 4+ 5 5  Shoulder extension 4 4 4+ 4 5 5   Shoulder abduction 4 4 4+ 4+ * 4+ 4+  Shoulder adduction        Shoulder  internal rotation 4+ 4+ 4+ 4+ 5 5  Shoulder external rotation 4 4 4 4 5  4+  Middle trapezius 4- 4-   4 4  Lower trapezius Unable Unable   3- 3-  Elbow flexion        Elbow extension        Wrist flexion        Wrist extension        Wrist ulnar deviation        Wrist radial deviation        Wrist pronation        Wrist supination        Grip strength (lbs)        (Blank rows = not tested, * - discomfort)  MUSCLE LENGTH: Hamstrings: Mild tight B ITB: WFL Piriformis: Mild tight B Hip flexors: WFL Quads: Mild tight B  LOWER EXTREMITY ROM: Grossly WFL/WNL  LOWER EXTREMITY MMT:  MMT Right eval Left eval R 12/11/23 L 12/11/23 R 12/27/23 L 12/27/23  Hip flexion 4- 4- 4+ 4 5 4+  Hip extension 4+ 4+ 4+ 4+ 5 5  Hip abduction 4- 4- 4 4+ 4+ 5  Hip adduction 4- 4- 4- 4 4+ 4+  Hip internal rotation 4+ 4+ 4+ 4+ 5 4+  Hip external rotation 3+ 3+ 4 4- 4+ 4  Knee flexion 5 5      Knee extension 5 5      Ankle dorsiflexion 5 5      Ankle plantarflexion        Ankle inversion        Ankle eversion         (Blank rows = not tested)  BED MOBILITY:  Independent  TRANSFERS: Assistive device utilized: None  Sit to stand: Complete Independence Stand to sit: Complete Independence Chair to chair: Complete Independence Floor:  NT  GAIT: Distance walked: Clinic distances Assistive device utilized: None Level of assistance: Complete Independence Gait pattern: WFL   TODAY'S TREATMENT: [facility] Adult PT Treatment:  DATE: 01/17/24 Pt seen for aquatic therapy today.  Treatment took place in water 3.5-4.75 ft in depth at the [facility] pool. Temp of water was 91.  Pt entered/exited the pool via stairs independently with bilat rail.   Exercises - Suitcase Carry  - Side Stepping  - Noodle press  - Standing Shoulder Horizontal Abduction Using Hand buoy   - Drawing Bow - Leg swings flex/ext - Heel Toe Raises at [facility]  - Leg Swing out  to the side  - Cycling on noodle/noodle wrapped under shoulders     Pt requires the buoyancy and hydrostatic pressure of water for support, and to offload joints by unweighting joint load by at least 50 % in navel deep water and by at least 75-80% in chest to neck deep water.  Viscosity of the water is needed for resistance of strengthening. Water current perturbations provides challenge to standing balance requiring increased core activation.  [facility] Adult PT Treatment:                                                DATE: 01/15/24 Pt seen for aquatic therapy today.  Treatment took place in water 3.5-4.75 ft in depth at the [facility] pool. Temp of water was 91.  Pt entered/exited the pool via stairs independently with bilat rail.  - unsupported walking forward/ backward, multiple laps  - side stepping with arm addct/ abdct with rainbow hand floats x 4 laps - farmer carry with bilat then single rainbow hand floats at side -> single  at side, walking forward/ backward. VC for abd bracing. - TrA and lower trap set with short hollow noodle ->full hollow noodle pull down to thighs x 10 in wide stance, x 5 in staggered stance x 5  - staggered stance with bilat horiz abdct/ addct with rainbow hand floats x 5 each  -introduced Bow and arrow. VC and demonstration for execution.  Some discomfort shoulder with drawing back - straddling yellow noodle, no UE support, breast stroke ue: cycling in deeper water    Pt requires the buoyancy and hydrostatic pressure of water for support, and to offload joints by unweighting joint load by at least 50 % in navel deep water and by at least 75-80% in chest to neck deep water.  Viscosity of the water is needed for resistance of strengthening. Water current perturbations provides challenge to standing balance requiring increased core activation.    [facility] Adult PT Treatment:                                                DATE: 01/01/24 Pt seen for aquatic therapy  today.  Treatment took place in water 3.5-4.75 ft in depth at the [facility] pool. Temp of water was 91.  Pt entered/exited the pool via stairs independently with bilat rail.  - unsupported walking forward/ backward, multiple laps  - side stepping with arm addct/ abdct x 1 laps -> with rainbow hand floats (improved tolerance) x 2 laps - farmer carry with bilat then single rainbow hand floats at side -> single yellow hand float at side, walking forward/ backward - TrA and lower trap set with short hollow noodle pull down to thighs x  10 in wide stance, x 5 in staggered stance x 5  (full hollow noodle too much) - staggered stance with bilat horiz abdct/ addct with rainbow hand floats x 5 each  -introduced Bow and arrow. VC and demonstration for execution.  Some discomfort shoulder with drawing back - straddling yellow noodle, no UE support, breast stroke ue: cycling in deeper water   Pt requires the buoyancy and hydrostatic pressure of water for support, and to offload joints by unweighting joint load by at least 50 % in navel deep water and by at least 75-80% in chest to neck deep water.  Viscosity of the water is needed for resistance of strengthening. Water current perturbations provides challenge to standing balance requiring increased core activation.     [facility] Adult PT Treatment:      12/27/23 THERAPEUTIC EXERCISE: To improve strength and endurance.  Demonstration, verbal and tactile cues throughout for technique.  NuStep - L6 x 6 min (UE/LE) Verbal HEP review with pt denying any concerns, need for physical practice or resistance modifications  THERAPEUTIC ACTIVITIES: To improve functional performance.  Demonstration, verbal and tactile cues throughout for technique. Modified Oswestry: 11 / 50 = 22.0 % QuickDASH: 27.3 / 100 = 27.3 % UE ROM UE/LE MMT  Goal assessment   [facility] Adult PT Treatment:                                                DATE: 12/24/23 Pt seen for aquatic  therapy today.  Treatment took place in water 3.5-4.75 ft in depth at the [facility] pool. Temp of water was 91.  Pt entered/exited the pool via stairs independently with bilat rail.  - Intro to aquatic therapy principles - unsupported walking forward/ backward - side stepping with arm addct/ abdct x 2 laps -> with rainbow hand floats (challenge) - wide stance, then staggered stance with bilat horiz abdct/ addct with rainbow hand floats x 5 each  - walking -> marching forward/ backward with reciprocal row with rainbow hand floats - UE on hand floats:  toe/heel raisesx 10 ; hip add/abdx 10 (balance challenge)  - UE on wall: leg swings into hip flexion /extension x 10 (too difficult with hand floats) - straddling yellow noodle, no UE support: cycling in deeper water     12/18/2023  THERAPEUTIC EXERCISE: To improve strength and endurance.  Demonstration, verbal and tactile cues throughout for technique.  NuStep - L6 x 6 min (UE/LE)  MANUAL THERAPY: To promote normalized muscle tension, improved flexibility, increased ROM, and reduced pain utilizing connective tissue massage, therapeutic massage, manual TP therapy, and myofascial release.  STM/DTM, XFM, manual TPR and gentle IASTM to L anterolateral deltoids and biceps Gentle STM and manual TPR to L lateral pecs STM/DTM, XFM, manual TPR and gentle IASTM to R anterior, lateral and posterior deltoids    PATIENT EDUCATION:  Education details: aquatic exercise progressions / modifications Person educated: Patient Education method: Explanation Education comprehension: verbalized understanding  HOME EXERCISE PROGRAM: Access Code: [email or web address] URL: [email or web address] Date: 12/06/2023 Prepared by: Glenetta Hew  Exercises - Doorway Pec Stretch at 60 Degrees Abduction with Arm Straight  - 2 x daily - 7 x weekly - 3 reps - 30 sec hold - Seated Thoracic Lumbar Extension  - 1-2 x daily - 7 x weekly - 2 sets - 10 reps - 3 sec  hold - Supine Shoulder External Rotation with Dowel  - 1-2 x daily - 7 x weekly - 2 sets - 10 reps - 3 sec hold - Supine Shoulder Flexion AAROM with Dowel  - 1-2 x daily - 7 x weekly - 2 sets - 10 reps - 3 sec hold - Supine Shoulder Protraction with Dowel  - 1-2 x daily - 7 x weekly - 2 sets - 10 reps - 3 sec hold - Shoulder Flexion Wall Slide with Towel  - 1-2 x daily - 7 x weekly - 2 sets - 10 reps - 3 sec hold - Supine Shoulder Horizontal Abduction with Resistance  - 1 x daily - 3-4 x weekly - 2 sets - 10 reps - 3 sec hold - Hooklying Single Leg Bent Knee Fallouts with Resistance  - 1 x daily - 3-4 x weekly - 2 sets - 10 reps - 3 sec hold - Supine March with Resistance Band  - 1 x daily - 3-4 x weekly - 2 sets - 10 reps - 2-3 sec hold hold - Supine Hip Adduction Isometric with Ball  - 2 x daily - 3-4 x weekly - 2 sets - 10 reps - 5 sec hold - Seated Bilateral Shoulder External Rotation with Resistance  - 1 x daily - 3-4 x weekly - 2 sets - 10 reps - 3 sec hold - Standing Bilateral Low Shoulder Row with Anchored Resistance  - 1 x daily - 3-4 x weekly - 2 sets - 10 reps - 5 sec hold - Scapular Retraction with Resistance Advanced  - 1 x daily - 3-4 x weekly - 2 sets - 10 reps - 5 sec hold - Low Trap Setting at Wall  - 1 x daily - 3-4 x weekly - 2 sets - 10 reps - 3 sec hold  Aquatic This aquatic home exercise program from [employer] utilizes pictures from land based exercises, but has been adapted prior to lamination and issuance.   Access Code: [email or web address] URL: [email or web address] Date: 01/15/2024 Prepared by: Geni Bers  UPDATED:Access Code: [email or web address] URL: [email or web address] Date: 01/17/2024 Prepared by: Geni Bers  Exercises - Suitcase Carry  - 1 x daily - 1-3 x weekly - Side Stepping  - 1 x daily - 1-3 x weekly - Noodle press  - 1 x daily - 1-3 x weekly - 1-2 sets - 5-10 reps - Standing Shoulder Horizontal Abduction Using Hand buoy  - 1 x daily -  1-3 x weekly - 1-2 sets - 10 reps - Drawing Bow  - 1 x daily - 1-3 x weekly - 1-2 sets - 5-10 reps - Leg swings flex/ext  - 1 x daily - 1-3 x weekly - 1-3 sets - 10 reps - Heel Toe Raises at Pool Wall  - 1 x daily - 1-3 x weekly - 1-2 sets - 10 reps - Leg Swing out to the side  - 1 x daily - 1-3 x weekly - 31-3 sets - 10 reps - Cycling on noodle/noodle wrapped under shoulders  - 1 x daily - 1-3 x weekly Printed but not issued   Patient Education - Do it right & prevent fractures - Osteoporosis education - Hospital doctor - Trigger Point Dry Needling - Kinesiology tape   ASSESSMENT:  CLINICAL IMPRESSION: Began instruction of final aquatic HEP. She is given vc, demonstration/clarification of all exercises for understanding.  She demonstrates good execution with assistance.  Plan on giving final copy next session and have pt  run through.  Good toleration today, no pain.  Goals ongoing    OBJECTIVE IMPAIRMENTS: decreased activity tolerance, decreased knowledge of condition, difficulty walking, decreased ROM, decreased strength, decreased safety awareness, hypomobility, increased fascial restrictions, impaired perceived functional ability, increased muscle spasms, impaired flexibility, impaired UE functional use, improper body mechanics, postural dysfunction, and pain.   ACTIVITY LIMITATIONS: carrying, lifting, bending, sitting, standing, reach over head, locomotion level, and caring for others  PARTICIPATION LIMITATIONS: meal prep, cleaning, laundry, driving, shopping, community activity, and yard work  PERSONAL FACTORS: Past/current experiences, Time since onset of injury/illness/exacerbation, and 3+ comorbidities: T8 compression fracture 10/2022, osteoporosis, chronic B thoracic back pain, HTN, muscle cramping, L breast cancer, aortic insufficiency, ascending aortic aneurysm, B sensorineural hearing loss  are also affecting patient's functional outcome.   REHAB POTENTIAL:  Excellent  CLINICAL DECISION MAKING: Evolving/moderate complexity  EVALUATION COMPLEXITY: Moderate   GOALS: Goals reviewed with patient? Yes  SHORT TERM GOALS: Target date: 12/06/2023   Patient will be independent with initial HEP. Baseline: HEP initiated on eval Goal status: MET - 11/27/23   2.  Patient will report 25% improvement in mid back & R UE pain to improve QOL. Baseline: Thoracic back pain up to 5/10, R upper arm pain up to 4-5/10 Goal status: PARTIALLY MET - 12/27/23 - back pain 30% improved but still more likely to persist when present; R upper arm pain 15-20% improved with pain typically only momentary with certain movements now and does not seem to linger   LONG TERM GOALS: Target date: 01/03/2024; extended to 02/07/2024  Patient will be independent with ongoing/advanced HEP for self-management at home.  Baseline:  Goal status: PARTIALLY MET - 12/27/23 - met for land-based HEP  2.  Patient will report 50-75% improvement in low back and R UE pain to improve QOL.  Baseline:  Thoracic back pain up to 5/10, R upper arm pain up to 4-5/10 Goal status: IN PROGRESS - 12/27/23 - back pain 30% improved but still more likely to persist when present; R upper arm pain 15-20% improved with pain typically only momentary with certain movements now and does not seem to linger   3.  Patient to demonstrate awareness of osteoporosis precautions as well as ability to achieve and maintain good spinal alignment/posturing and body mechanics needed for daily activities. Baseline:  Goal status: MET - 11/22/23  4.  Patient will demonstrate functional pain free B shoulder ROM to perform ADLs.   Baseline: Refer to above UE ROM table Goal status: IN PROGRESS - 12/27/23 - B shoulders improving in most planes however pain still limiting OH ROM  5  Patient will demonstrate improved B shoulder and scapular strength to >/= 4+/5 for functional UE use Baseline: Refer to above UE MMT table Goal status:  PARTIALLY MET - 12/27/23 - B shoulders now 4+ to 5/5 but scapular strength remains weaker  6.  Patient will demonstrate improved B LE strength to >/= 4+/5 for improved stability and ease of mobility. Baseline: Refer to above LE MMT table Goal status: MET - 12/27/23 - met except L hip ER 4/5  7.  Patient will report </= 22% on QuickDASH (MCID = 10 pts) to demonstrate improved functional UE use with decreased pain interference. Baseline: 31.8 / 100 = 31.8 %; 12/04/23 - 31.8 / 100 = 31.8 % Goal status: IN PROGRESS - 12/27/23 -  27.3 / 100 = 27.3 %  8. Patient will report </= 18% on Modified Oswestry (MCID = 12%) to demonstrate improved functional  ability with decreased pain interference. Baseline: 15 / 50 = 30.0 %; 12/04/23 - 12 / 50 = 24.0 % Goal status: IN PROGRESS - 12/27/23 - 11 / 50 = 22.0 %   PLAN:  PT FREQUENCY: 2x/week  PT DURATION:  4-6 weeks   PLANNED INTERVENTIONS: 97164- PT Re-evaluation, 97110-Therapeutic exercises, 97530- Therapeutic activity, 97112- Neuromuscular re-education, 97535- Self Care, 16109- Manual therapy, [phone]- Gait training, [phone]- Aquatic Therapy, [phone]- Electrical stimulation (unattended), [phone]- Electrical stimulation (manual), 97016- Vasopneumatic device, 97035- Ultrasound, 13086- Ionotophoresis 4mg /ml Dexamethasone, Patient/Family education, Balance training, Stair training, Taping, Dry Needling, Joint mobilization, Spinal mobilization, Cryotherapy, Moist heat, and 97750- Physical Performance Test or Measurement  PLAN FOR NEXT SESSION: Aquatic PT for training in water-based HEP  Corrie Dandy Tomma Lightning) Amberia Bayless MPT 01/17/24 12:33 PM [facility] [address] [place], [place], [address] Phone: [phone]   Fax:  [phone]     Date of referral: 10/30/2023 Referring provider: Bradd Canary, MD Referring diagnosis? R53.1 (ICD-10-CM) - Weakness  (Osteoporosis, falls and weakness) Treatment diagnosis? (if different  than referring diagnosis)  Muscle weakness (generalized)  Pain in thoracic spine  Pain of right upper arm  What was this (referring dx) caused by? Ongoing Issue and Other: Weakness related to osteoporosis with T8 compression fracture  Ashby Dawes of Condition: Chronic (continuous duration > 3 months)   Laterality: Both  Current Functional Measure Score: Back Index 11 / 50 = 22.0 %  & QuickDASH 27.3 / 100 = 27.3 %  Objective measurements identify impairments when they are compared to normal values, the uninvolved extremity, and prior level of function.  [x]  Yes  []  No  Objective assessment of functional ability: Moderate functional limitations   Briefly describe symptoms: Verdene reports her back pain is not as frequent but feels her B shoulder/upper arm pain, while only short-lived when present, is more limiting functionally affecting daily activities such as dressing and reaching overhead.  B shoulder ROM has improved in most planes but remains limited by pain in overhead ROM bilaterally and FIR on right. Overall B shoulder strength now 4+ to 5/5 with exception of scapular muscles at 4/5 for middle traps and 3-/5 for lower traps.   Proximal LE strength also improving with overall MMT 4+ to 5/5 with exception of L hip ER at 4/5.  Gains noted in patient reported outcome measures on both QuickDASH and modified Oswestry but not yet to anticipated goals levels.  Leonie will benefit from continued skilled PT to address ongoing pain, ROM and strength deficits to improve mobility and activity tolerance with decreased pain interference, therefore will recommend recert for additional 2x/wk for up to 4-6 weeks.    Thoracic back pain from history of T8 compression fracture 1 year ago.  She also notes recent onset of R upper arm pain originating around the time she underwent lumpectomy and axillary node dissection for breast cancer. Current deficits include pain as described above, postural abnormalities, core  and postural weakness with limited proximal UE ROM and strength as well as proximal LE weakness.    How did symptoms start: Sudden onset for thoracic back pain at time of T8 compression fracture on 11/13/2022.  Gradual onset for R upper arm pain.  Weakness as a sequelae to both.  Average pain intensity:  Last 24 hours: 2/10  Past week: 2/10  How often does the pt experience symptoms? Frequently  How much have the symptoms interfered with usual daily activities? A little bit  How has condition changed  since care began at this facility? A little better  In general, how is the patients overall health? Good  Onset date: 10/2022 - T8 compression fracture; 08/2023 - R upper arm pain & L lumpectomy and axillary node dissection secondary to breast cancer   BACK PAIN (STarT Back Screening Tool) - (When applicable):  Has your back pain spread down your leg(s) at sometime in the last 2 weeks? []  Yes   [x]  No Have you had pain in the shoulder or neck at sometime in the past 2 weeks? [x]  Yes   []  No Have you only walked short distances because of your back pain? []  Yes   [x]  No In the past 2 weeks, have you dressed more slowly than usual because of your back pain? []  Yes   [x]  No Do you think it is not really safe for person with a condition like yours to be physically active? []  Yes   [x]  No Have worrying thoughts been going through your mind a lot of the time? []  Yes   [x]  No Do you feel that your back pain is terrible and it is never going to get any better? []  Yes   [x]  No In general, have you stopped enjoying all the things you usually enjoy? []  Yes   [x]  No Overall, how bothersome has your back pain been in the last 2 weeks? []  Not at all   []  Slightly     [x]  Moderate   []  Very much     []  Extremely

## 2024-01-21 ENCOUNTER — Encounter (HOSPITAL_BASED_OUTPATIENT_CLINIC_OR_DEPARTMENT_OTHER): Payer: Self-pay | Admitting: Physical Therapy

## 2024-01-21 ENCOUNTER — Ambulatory Visit (HOSPITAL_BASED_OUTPATIENT_CLINIC_OR_DEPARTMENT_OTHER): Admitting: Physical Therapy

## 2024-01-21 DIAGNOSIS — M6281 Muscle weakness (generalized): Secondary | ICD-10-CM

## 2024-01-21 DIAGNOSIS — M546 Pain in thoracic spine: Secondary | ICD-10-CM

## 2024-01-21 DIAGNOSIS — M79621 Pain in right upper arm: Secondary | ICD-10-CM

## 2024-01-21 NOTE — Therapy (Signed)
 OUTPATIENT PHYSICAL THERAPY TREATMENT  Patient Name: Yolanda Hernandez MRN: [id number] DOB:07-10-1956, 68 y.o., female Today's Date: 01/21/2024  END OF SESSION:  PT End of Session - 01/21/24 1023     Visit Number 17    Date for PT Re-Evaluation 02/07/24    Authorization Type [employer] Medicare    Progress Note Due on Visit 23    PT Start Time 1017    PT Stop Time 1055    PT Time Calculation (min) 38 min    Activity Tolerance Patient tolerated treatment well    Behavior During Therapy WFL for tasks assessed/performed               Past Medical History:  Diagnosis Date   Cerumen impaction    chronic   Cervical cancer screening 03/12/2015   Menarche at 10 Regular and moderate flow  history of abnormal pap in past, bx and cryo once many years ago, normal since. Last 2011 and normal G0P0, s/p No history of abnormal MGM Last in 2015, no breast concerns  No concerns today Biopsy, cervical: gyn surgeries Menopause at 53 ish   Conflict between patient and family 03/26/2021   Dysmenorrhea    Heart murmur 03/26/2021   Hematuria 07/31/2017   HPV in female 2019   Hyperglycemia 03/19/2013   Hypertension    Muscle cramping 03/26/2021   Osteopenia    Osteoporosis 04/11/2010   Qualifier: Diagnosis of  By: Nena Jordan    Other and unspecified hyperlipidemia 03/19/2013   Preventative health care 09/08/2011   Right calf pain 03/22/2021   Skin cancer    Skin cancer 07/14/2016   Uterine fibroid    Past Surgical History:  Procedure Laterality Date   BREAST SURGERY     biopsy left   COLPOSCOPY     GYNECOLOGIC CRYOSURGERY     MYRINGOPLASTY  1972   TONSILLECTOMY     Patient Active Problem List   Diagnosis Date Noted   Breast cancer, left (HCC) 10/30/2023   Weakness 03/20/2023   Closed wedge compression fracture of T8 vertebra (HCC) 03/20/2023   Chronic bilateral thoracic back pain 03/20/2023   Abnormal posture 03/20/2023   Thoracic compression fracture (HCC) 11/13/2022    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 04/24/2022   History of COVID-19 03/23/2022   Aortic insufficiency 06/08/2021   Ascending aortic aneurysm (HCC) 06/08/2021   Hypertension 06/07/2021   Conflict between patient and family 03/26/2021   Heart murmur 03/26/2021   Muscle cramping 03/26/2021   Right calf pain 03/22/2021   Excessive cerumen in right ear canal 01/14/2018   HPV in female 2019   Hematuria 07/31/2017   Asymmetric SNHL (sensorineural hearing loss) 11/29/2015   Subjective tinnitus of left ear 11/29/2015   Sensorineural hearing loss (SNHL), bilateral 11/29/2015   Cervical cancer screening 03/12/2015   Contact dermatitis 07/07/2013   Hyperlipidemia, mixed 03/19/2013   Hyperglycemia 03/19/2013   Osteopenia 03/19/2013   Preventative health care 09/08/2011   Osteoporosis 04/11/2010   FIBROIDS, UTERUS 03/07/2010   ELEVATED BLOOD PRESSURE WITHOUT DIAGNOSIS OF HYPERTENSION 03/07/2010   SKIN CANCER, HX OF 03/07/2010    PCP: Bradd Canary, MD   REFERRING PROVIDER: Bradd Canary, MD   REFERRING DIAG: R53.1 (ICD-10-CM) - Weakness  (Osteoporosis, falls and weakness)  THERAPY DIAG:  Muscle weakness (generalized)  Pain in thoracic spine  Pain of right upper arm  RATIONALE FOR EVALUATION AND TREATMENT: Rehabilitation  ONSET DATE: 10/2022 - T8 compression fracture; 08/2023 - R  upper arm pain & L lumpectomy and axillary node dissection secondary to breast cancer  NEXT MD VISIT: 05/01/24   SUBJECTIVE:   SUBJECTIVE STATEMENT: I do think it is better.  No pain right now.  I can do more.  But it is still there.    EVAL: Pt reports h/o T8 compression fracture ~1 yr ago (11/13/22) while participating in a gym based exercise program focusing on osteoporosis (she describes standing in slight squat while lifting a resistance machine in front of her when she felt a pop in her mid back).  She was initially told it was a muscle strain and only later identified as a  compression fracture on x-ray.  She did complete an episode of PT following this with some benefit noted but later when trying to 3 reduce the exercises at home started to notice limitation due to R upper arm pain.  She has been walking and doing some weightbearing exercises but has not progressed her strengthening and would like to do so.  In November she was diagnosed with breast cancer and underwent a lumpectomy and axillary node dissection on the left for which she was prescribed a HEP to prevent lymphedema.  She she continues to note some pain in her R upper arm over the past few months which limits her ability to reach with her R UE, perform her lymphedema exercises or sleep on her R side.  PAIN: Are you having pain? No: NPRS scale: 0/10  Pain location: mid back (~T8) Pain description: stiff, achy, uncomfortable Aggravating factors: cleaning (scrubbing the tub, vacuuming, esp when she does a lot in a day), otherwise unpredictable Relieving factors: heating pad, Tylenol  Are you having pain? No: NPRS scale: 0/10  Pain location: R lateral upper arm/shoulder Pain description: quick onset, pulling Aggravating factors: laying on R side, overhead shoulder ER (pec stretch), end range OH ROM Relieving factors: stopping triggering movement  PERTINENT HISTORY: T8 compression fracture 10/2022, osteoporosis, chronic B thoracic back pain, HTN, muscle cramping, L breast cancer, aortic insufficiency, ascending aortic aneurysm, B sensorineural hearing loss  PRECAUTIONS: Other: Osteoporosis with h/o thoracic compression fx  HAND DOMINANCE: Right  RED FLAGS: None  WEIGHT BEARING RESTRICTIONS: No  FALLS:  Has patient fallen in last 6 months? 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 To get rid of the arm pain and minimize the back pain. To stay active."   OBJECTIVE: (objective measures completed at initial evaluation unless otherwise dated)  DIAGNOSTIC FINDINGS:  06/20/23 - MR thoracic spine: IMPRESSION: 1. No acute osseous injury of the thoracic spine. 2. Chronic T8 vertebral body compression fracture with 50% height loss anteriorly.  04/18/23 - XR thoracic spine: T8 compression fracture with anterior height loss.   No significant change in height loss since films on 12/12/2022. No other  fracture seen.  No spondylolisthesis seen.   PATIENT SURVEYS:  ABC scale 1390 / 1600 = 86.9 %, > 80% indicates high level of physical functioning Modified Oswestry 15 / 50 = 30.0 %, indicates moderate  disability  Quick Dash 31.8 / 100 = 31.8 %  12/04/23: Modified Oswestry: 12 / 50 = 24.0 % QuickDASH: 31.8 / 100 = 31.8 %  12/27/23:   COGNITION: Overall cognitive status: Within functional limits for tasks assessed     SENSATION: WFL  POSTURE:  rounded shoulders, forward head, and increased thoracic kyphosis  CERVICAL ROM:  Grossly WFL/WNL  UPPER EXTREMITY ROM:   Active ROM Right eval Left eval R 11/27/23 L 11/27/23 R 12/27/23 L 12/27/23  Shoulder flexion 120 124 128 129 135 ^ 131 ^  Shoulder extension 40 ^ 46 50 50 54 50  Shoulder abduction 142 ^ 138 114 ^ 136 150 ^ 140 ^  Shoulder adduction        Shoulder internal rotation FIR L3 ^ FIR T12 FIR L1 FIR T12 FIR L3 ^ FIR T12  Shoulder external rotation FER T3 ^ FER T3 FER T5 FER T5 FER T4 FER T5  (Blank rows = not tested, ^ - increased pain)  UPPER EXTREMITY MMT:  MMT Right eval Left eval R 12/11/23 L 12/11/23 R 12/27/23 L 12/27/23  Shoulder flexion 4 4 4+ 4+ 5 5  Shoulder extension  4 4 4+ 4 5 5   Shoulder abduction 4 4 4+ 4+ * 4+ 4+  Shoulder adduction        Shoulder internal rotation 4+ 4+ 4+ 4+ 5 5  Shoulder external rotation 4 4 4 4 5  4+  Middle trapezius 4- 4-   4 4  Lower trapezius Unable Unable   3- 3-  Elbow flexion        Elbow extension        Wrist flexion        Wrist extension        Wrist ulnar deviation        Wrist radial deviation        Wrist pronation        Wrist supination        Grip strength (lbs)        (Blank rows = not tested, * - discomfort)  MUSCLE LENGTH: Hamstrings: Mild tight B ITB: WFL Piriformis: Mild tight B Hip flexors: WFL Quads: Mild tight B  LOWER EXTREMITY ROM: Grossly WFL/WNL  LOWER EXTREMITY MMT:  MMT Right eval Left eval R 12/11/23 L 12/11/23 R 12/27/23 L 12/27/23  Hip flexion 4- 4- 4+ 4 5 4+  Hip extension 4+ 4+ 4+ 4+ 5 5  Hip abduction 4- 4- 4 4+ 4+ 5  Hip adduction 4- 4- 4- 4 4+ 4+  Hip internal rotation 4+ 4+ 4+ 4+ 5 4+  Hip external rotation 3+ 3+ 4 4- 4+ 4  Knee flexion 5 5      Knee extension 5 5      Ankle dorsiflexion 5 5      Ankle plantarflexion        Ankle inversion        Ankle eversion         (Blank rows = not tested)  BED MOBILITY:  Independent  TRANSFERS: Assistive device utilized: None  Sit to stand: Complete Independence Stand to sit: Complete Independence Chair to chair: Complete Independence Floor:  NT  GAIT: Distance walked: Clinic distances Assistive device utilized: None Level of assistance: Complete Independence Gait pattern: WFL   TODAY'S TREATMENT: [facility] Adult PT Treatment:  DATE: 01/21/24 Pt seen for aquatic therapy today.  Treatment took place in water 3.5-4.75 ft in depth at the [facility] pool. Temp of water was 91.  Pt entered/exited the pool via stairs independently with bilat rail.   Exercises - Suitcase Carry  - Side Stepping  - Noodle press  - Standing Shoulder Horizontal Abduction Using Hand buoy    - Drawing Bow - Leg swings flex/ext - Heel Toe Raises at [facility]  - Leg Swing out to the side  - Cycling on noodle/noodle wrapped under shoulders     Pt requires the buoyancy and hydrostatic pressure of water for support, and to offload joints by unweighting joint load by at least 50 % in navel deep water and by at least 75-80% in chest to neck deep water.  Viscosity of the water is needed for resistance of strengthening. Water current perturbations provides challenge to standing balance requiring increased core activation.  [facility] Adult PT Treatment:                                                DATE: 01/15/24 Pt seen for aquatic therapy today.  Treatment took place in water 3.5-4.75 ft in depth at the [facility] pool. Temp of water was 91.  Pt entered/exited the pool via stairs independently with bilat rail.  - unsupported walking forward/ backward, multiple laps  - side stepping with arm addct/ abdct with rainbow hand floats x 4 laps - farmer carry with bilat then single rainbow hand floats at side -> single  at side, walking forward/ backward. VC for abd bracing. - TrA and lower trap set with short hollow noodle ->full hollow noodle pull down to thighs x 10 in wide stance, x 5 in staggered stance x 5  - staggered stance with bilat horiz abdct/ addct with rainbow hand floats x 5 each  -introduced Bow and arrow. VC and demonstration for execution.  Some discomfort shoulder with drawing back - straddling yellow noodle, no UE support, breast stroke ue: cycling in deeper water    Pt requires the buoyancy and hydrostatic pressure of water for support, and to offload joints by unweighting joint load by at least 50 % in navel deep water and by at least 75-80% in chest to neck deep water.  Viscosity of the water is needed for resistance of strengthening. Water current perturbations provides challenge to standing balance requiring increased core activation.    [facility] Adult PT Treatment:                                                 DATE: 01/01/24 Pt seen for aquatic therapy today.  Treatment took place in water 3.5-4.75 ft in depth at the [facility] pool. Temp of water was 91.  Pt entered/exited the pool via stairs independently with bilat rail.  - unsupported walking forward/ backward, multiple laps  - side stepping with arm addct/ abdct x 1 laps -> with rainbow hand floats (improved tolerance) x 2 laps - farmer carry with bilat then single rainbow hand floats at side -> single yellow hand float at side, walking forward/ backward - TrA and lower trap set with short hollow noodle pull down to thighs x  10 in wide stance, x 5 in staggered stance x 5  (full hollow noodle too much) - staggered stance with bilat horiz abdct/ addct with rainbow hand floats x 5 each  -introduced Bow and arrow. VC and demonstration for execution.  Some discomfort shoulder with drawing back - straddling yellow noodle, no UE support, breast stroke ue: cycling in deeper water   Pt requires the buoyancy and hydrostatic pressure of water for support, and to offload joints by unweighting joint load by at least 50 % in navel deep water and by at least 75-80% in chest to neck deep water.  Viscosity of the water is needed for resistance of strengthening. Water current perturbations provides challenge to standing balance requiring increased core activation.     [facility] Adult PT Treatment:      12/27/23 THERAPEUTIC EXERCISE: To improve strength and endurance.  Demonstration, verbal and tactile cues throughout for technique.  NuStep - L6 x 6 min (UE/LE) Verbal HEP review with pt denying any concerns, need for physical practice or resistance modifications  THERAPEUTIC ACTIVITIES: To improve functional performance.  Demonstration, verbal and tactile cues throughout for technique. Modified Oswestry: 11 / 50 = 22.0 % QuickDASH: 27.3 / 100 = 27.3 % UE ROM UE/LE MMT  Goal assessment   [facility] Adult PT  Treatment:                                                DATE: 12/24/23 Pt seen for aquatic therapy today.  Treatment took place in water 3.5-4.75 ft in depth at the [facility] pool. Temp of water was 91.  Pt entered/exited the pool via stairs independently with bilat rail.  - Intro to aquatic therapy principles - unsupported walking forward/ backward - side stepping with arm addct/ abdct x 2 laps -> with rainbow hand floats (challenge) - wide stance, then staggered stance with bilat horiz abdct/ addct with rainbow hand floats x 5 each  - walking -> marching forward/ backward with reciprocal row with rainbow hand floats - UE on hand floats:  toe/heel raisesx 10 ; hip add/abdx 10 (balance challenge)  - UE on wall: leg swings into hip flexion /extension x 10 (too difficult with hand floats) - straddling yellow noodle, no UE support: cycling in deeper water     12/18/2023  THERAPEUTIC EXERCISE: To improve strength and endurance.  Demonstration, verbal and tactile cues throughout for technique.  NuStep - L6 x 6 min (UE/LE)  MANUAL THERAPY: To promote normalized muscle tension, improved flexibility, increased ROM, and reduced pain utilizing connective tissue massage, therapeutic massage, manual TP therapy, and myofascial release.  STM/DTM, XFM, manual TPR and gentle IASTM to L anterolateral deltoids and biceps Gentle STM and manual TPR to L lateral pecs STM/DTM, XFM, manual TPR and gentle IASTM to R anterior, lateral and posterior deltoids    PATIENT EDUCATION:  Education details: aquatic exercise progressions / modifications Person educated: Patient Education method: Explanation Education comprehension: verbalized understanding  HOME EXERCISE PROGRAM: Access Code: [email or web address] URL: [email or web address] Date: 12/06/2023 Prepared by: Glenetta Hew  Exercises - Doorway Pec Stretch at 60 Degrees Abduction with Arm Straight  - 2 x daily - 7 x weekly - 3 reps - 30 sec  hold - Seated Thoracic Lumbar Extension  - 1-2 x daily - 7 x weekly - 2 sets - 10 reps - 3 sec  hold - Supine Shoulder External Rotation with Dowel  - 1-2 x daily - 7 x weekly - 2 sets - 10 reps - 3 sec hold - Supine Shoulder Flexion AAROM with Dowel  - 1-2 x daily - 7 x weekly - 2 sets - 10 reps - 3 sec hold - Supine Shoulder Protraction with Dowel  - 1-2 x daily - 7 x weekly - 2 sets - 10 reps - 3 sec hold - Shoulder Flexion Wall Slide with Towel  - 1-2 x daily - 7 x weekly - 2 sets - 10 reps - 3 sec hold - Supine Shoulder Horizontal Abduction with Resistance  - 1 x daily - 3-4 x weekly - 2 sets - 10 reps - 3 sec hold - Hooklying Single Leg Bent Knee Fallouts with Resistance  - 1 x daily - 3-4 x weekly - 2 sets - 10 reps - 3 sec hold - Supine March with Resistance Band  - 1 x daily - 3-4 x weekly - 2 sets - 10 reps - 2-3 sec hold hold - Supine Hip Adduction Isometric with Ball  - 2 x daily - 3-4 x weekly - 2 sets - 10 reps - 5 sec hold - Seated Bilateral Shoulder External Rotation with Resistance  - 1 x daily - 3-4 x weekly - 2 sets - 10 reps - 3 sec hold - Standing Bilateral Low Shoulder Row with Anchored Resistance  - 1 x daily - 3-4 x weekly - 2 sets - 10 reps - 5 sec hold - Scapular Retraction with Resistance Advanced  - 1 x daily - 3-4 x weekly - 2 sets - 10 reps - 5 sec hold - Low Trap Setting at Wall  - 1 x daily - 3-4 x weekly - 2 sets - 10 reps - 3 sec hold  Aquatic This aquatic home exercise program from [employer] utilizes pictures from land based exercises, but has been adapted prior to lamination and issuance.   Access Code: [email or web address] URL: [email or web address] Date: 01/15/2024 Prepared by: Geni Bers  UPDATED:Access Code: [email or web address] URL: [email or web address] Date: 01/17/2024 Prepared by: Geni Bers  Exercises - Suitcase Carry  - 1 x daily - 1-3 x weekly - Side Stepping  - 1 x daily - 1-3 x weekly - Noodle press  - 1 x daily - 1-3 x weekly  - 1-2 sets - 5-10 reps - Standing Shoulder Horizontal Abduction Using Hand buoy  - 1 x daily - 1-3 x weekly - 1-2 sets - 10 reps - Drawing Bow  - 1 x daily - 1-3 x weekly - 1-2 sets - 5-10 reps - Leg swings flex/ext  - 1 x daily - 1-3 x weekly - 1-3 sets - 10 reps - Heel Toe Raises at Pool Wall  - 1 x daily - 1-3 x weekly - 1-2 sets - 10 reps - Leg Swing out to the side  - 1 x daily - 1-3 x weekly - 31-3 sets - 10 reps - Cycling on noodle/noodle wrapped under shoulders  - 1 x daily - 1-3 x weekly Printed but not issued   Patient Education - Do it right & prevent fractures - Osteoporosis education - Posture and Body Mechanics - Trigger Point Dry Needling - Kinesiology tape   ASSESSMENT:  CLINICAL IMPRESSION: Pt with final instruction on aquatic HEP.  She is issued a laminated copy. Demonstrates understanding and indep. Pt edu on frequency and duration of program.  Has pool access.  She is also  encouraged to check out Pilates, yoga and/or Tai chi to create an exercises regimen that keeps her interested and engaged.  She has reached her max potential in aquatic setting and is due back to land based next session.     OBJECTIVE IMPAIRMENTS: decreased activity tolerance, decreased knowledge of condition, difficulty walking, decreased ROM, decreased strength, decreased safety awareness, hypomobility, increased fascial restrictions, impaired perceived functional ability, increased muscle spasms, impaired flexibility, impaired UE functional use, improper body mechanics, postural dysfunction, and pain.   ACTIVITY LIMITATIONS: carrying, lifting, bending, sitting, standing, reach over head, locomotion level, and caring for others  PARTICIPATION LIMITATIONS: meal prep, cleaning, laundry, driving, shopping, community activity, and yard work  PERSONAL FACTORS: Past/current experiences, Time since onset of injury/illness/exacerbation, and 3+ comorbidities: T8 compression fracture 10/2022, osteoporosis,  chronic B thoracic back pain, HTN, muscle cramping, L breast cancer, aortic insufficiency, ascending aortic aneurysm, B sensorineural hearing loss  are also affecting patient's functional outcome.   REHAB POTENTIAL: Excellent  CLINICAL DECISION MAKING: Evolving/moderate complexity  EVALUATION COMPLEXITY: Moderate   GOALS: Goals reviewed with patient? Yes  SHORT TERM GOALS: Target date: 12/06/2023   Patient will be independent with initial HEP. Baseline: HEP initiated on eval Goal status: MET - 11/27/23   2.  Patient will report 25% improvement in mid back & R UE pain to improve QOL. Baseline: Thoracic back pain up to 5/10, R upper arm pain up to 4-5/10 Goal status: PARTIALLY MET - 12/27/23 - back pain 30% improved but still more likely to persist when present; R upper arm pain 15-20% improved with pain typically only momentary with certain movements now and does not seem to linger   LONG TERM GOALS: Target date: 01/03/2024; extended to 02/07/2024  Patient will be independent with ongoing/advanced HEP for self-management at home.  Baseline:  Goal status: PARTIALLY MET - 12/27/23 - met for land-based HEP; Met for aquatics 01/21/24  2.  Patient will report 50-75% improvement in low back and R UE pain to improve QOL.  Baseline:  Thoracic back pain up to 5/10, R upper arm pain up to 4-5/10 Goal status: IN PROGRESS - 12/27/23 - back pain 30% improved but still more likely to persist when present; R upper arm pain 15-20% improved with pain typically only momentary with certain movements now and does not seem to linger   3.  Patient to demonstrate awareness of osteoporosis precautions as well as ability to achieve and maintain good spinal alignment/posturing and body mechanics needed for daily activities. Baseline:  Goal status: MET - 11/22/23  4.  Patient will demonstrate functional pain free B shoulder ROM to perform ADLs.   Baseline: Refer to above UE ROM table Goal status: IN PROGRESS -  12/27/23 - B shoulders improving in most planes however pain still limiting OH ROM  5  Patient will demonstrate improved B shoulder and scapular strength to >/= 4+/5 for functional UE use Baseline: Refer to above UE MMT table Goal status: PARTIALLY MET - 12/27/23 - B shoulders now 4+ to 5/5 but scapular strength remains weaker  6.  Patient will demonstrate improved B LE strength to >/= 4+/5 for improved stability and ease of mobility. Baseline: Refer to above LE MMT table Goal status: MET - 12/27/23 - met except L hip ER 4/5  7.  Patient will report </= 22% on QuickDASH (MCID = 10 pts) to demonstrate improved functional UE use with decreased pain interference. Baseline: 31.8 / 100 = 31.8 %; 12/04/23 - 31.8 / 100 =  31.8 % Goal status: IN PROGRESS - 12/27/23 -  27.3 / 100 = 27.3 %  8. Patient will report </= 18% on Modified Oswestry (MCID = 12%) to demonstrate improved functional ability with decreased pain interference. Baseline: 15 / 50 = 30.0 %; 12/04/23 - 12 / 50 = 24.0 % Goal status: IN PROGRESS - 12/27/23 - 11 / 50 = 22.0 %   PLAN:  PT FREQUENCY: 2x/week  PT DURATION:  4-6 weeks   PLANNED INTERVENTIONS: 97164- PT Re-evaluation, 97110-Therapeutic exercises, 97530- Therapeutic activity, 97112- Neuromuscular re-education, 97535- Self Care, 40981- Manual therapy, [phone]- Gait training, [phone]- Aquatic Therapy, [phone]- Electrical stimulation (unattended), [phone]- Electrical stimulation (manual), 97016- Vasopneumatic device, [id number]- Ultrasound, [id number]- Ionotophoresis 4mg /ml Dexamethasone, Patient/Family education, Balance training, Stair training, Taping, Dry Needling, Joint mobilization, Spinal mobilization, Cryotherapy, Moist heat, and 97750- Physical Performance Test or Measurement  PLAN FOR NEXT SESSION: Aquatic PT for training in water-based HEP  Corrie Dandy Tomma Lightning) [place] MPT 01/21/24 10:29 AM [facility] [address] [place], [place],  [address] Phone: [phone]   Fax:  [phone]     Date of referral: 10/30/2023 Referring provider: Bradd Canary, MD Referring diagnosis? R53.1 (ICD-10-CM) - Weakness  (Osteoporosis, falls and weakness) Treatment diagnosis? (if different than referring diagnosis)  Muscle weakness (generalized)  Pain in thoracic spine  Pain of right upper arm  What was this (referring dx) caused by? Ongoing Issue and Other: Weakness related to osteoporosis with T8 compression fracture  Ashby Dawes of Condition: Chronic (continuous duration > 3 months)   Laterality: Both  Current Functional Measure Score: Back Index 11 / 50 = 22.0 %  & QuickDASH 27.3 / 100 = 27.3 %  Objective measurements identify impairments when they are compared to normal values, the uninvolved extremity, and prior level of function.  [x]  Yes  []  No  Objective assessment of functional ability: Moderate functional limitations   Briefly describe symptoms: Autumn reports her back pain is not as frequent but feels her B shoulder/upper arm pain, while only short-lived when present, is more limiting functionally affecting daily activities such as dressing and reaching overhead.  B shoulder ROM has improved in most planes but remains limited by pain in overhead ROM bilaterally and FIR on right. Overall B shoulder strength now 4+ to 5/5 with exception of scapular muscles at 4/5 for middle traps and 3-/5 for lower traps.   Proximal LE strength also improving with overall MMT 4+ to 5/5 with exception of L hip ER at 4/5.  Gains noted in patient reported outcome measures on both QuickDASH and modified Oswestry but not yet to anticipated goals levels.  Xylia will benefit from continued skilled PT to address ongoing pain, ROM and strength deficits to improve mobility and activity tolerance with decreased pain interference, therefore will recommend recert for additional 2x/wk for up to 4-6 weeks.    Thoracic back pain from history of T8  compression fracture 1 year ago.  She also notes recent onset of R upper arm pain originating around the time she underwent lumpectomy and axillary node dissection for breast cancer. Current deficits include pain as described above, postural abnormalities, core and postural weakness with limited proximal UE ROM and strength as well as proximal LE weakness.    How did symptoms start: Sudden onset for thoracic back pain at time of T8 compression fracture on 11/13/2022.  Gradual onset for R upper arm pain.  Weakness as a sequelae to both.  Average pain intensity:  Last 24 hours:  2/10  Past week: 2/10  How often does the pt experience symptoms? Frequently  How much have the symptoms interfered with usual daily activities? A little bit  How has condition changed since care began at this facility? A little better  In general, how is the patients overall health? Good  Onset date: 10/2022 - T8 compression fracture; 08/2023 - R upper arm pain & L lumpectomy and axillary node dissection secondary to breast cancer   BACK PAIN (STarT Back Screening Tool) - (When applicable):  Has your back pain spread down your leg(s) at sometime in the last 2 weeks? []  Yes   [x]  No Have you had pain in the shoulder or neck at sometime in the past 2 weeks? [x]  Yes   []  No Have you only walked short distances because of your back pain? []  Yes   [x]  No In the past 2 weeks, have you dressed more slowly than usual because of your back pain? []  Yes   [x]  No Do you think it is not really safe for person with a condition like yours to be physically active? []  Yes   [x]  No Have worrying thoughts been going through your mind a lot of the time? []  Yes   [x]  No Do you feel that your back pain is terrible and it is never going to get any better? []  Yes   [x]  No In general, have you stopped enjoying all the things you usually enjoy? []  Yes   [x]  No Overall, how bothersome has your back pain been in the last 2 weeks? []   Not at all   []  Slightly     [x]  Moderate   []  Very much     []  Extremely

## 2024-01-23 ENCOUNTER — Encounter: Payer: Self-pay | Admitting: Physical Therapy

## 2024-01-23 ENCOUNTER — Ambulatory Visit: Attending: Family Medicine | Admitting: Physical Therapy

## 2024-01-23 DIAGNOSIS — M546 Pain in thoracic spine: Secondary | ICD-10-CM | POA: Insufficient documentation

## 2024-01-23 DIAGNOSIS — M79621 Pain in right upper arm: Secondary | ICD-10-CM | POA: Diagnosis present

## 2024-01-23 DIAGNOSIS — M6281 Muscle weakness (generalized): Secondary | ICD-10-CM | POA: Diagnosis present

## 2024-01-23 NOTE — Therapy (Addendum)
 OUTPATIENT PHYSICAL THERAPY TREATMENT / DISCHARGE SUMMARY  Progress Note  Reporting Period 12/27/23 to 01/23/24  See note below for Objective Data and Assessment of Progress/Goals.    Patient Name: Yolanda Hernandez MRN: [id number] DOB:December 20, 1955, 68 y.o., female Today's Date: 01/23/2024  END OF SESSION:  PT End of Session - 01/23/24 0934     Visit Number 18    Date for PT Re-Evaluation 02/07/24    Authorization Type [employer] Medicare    Authorization Time Period auth pending    PT Start Time 0933    PT Stop Time 1013    PT Time Calculation (min) 40 min    Activity Tolerance Patient tolerated treatment well;Patient limited by pain    Behavior During Therapy [facility] for tasks assessed/performed                Past Medical History:  Diagnosis Date   Cerumen impaction    chronic   Cervical cancer screening 03/12/2015   Menarche at 10 Regular and moderate flow  history of abnormal pap in past, bx and cryo once many years ago, normal since. Last 2011 and normal G0P0, s/p No history of abnormal MGM Last in 2015, no breast concerns  No concerns today Biopsy, cervical: gyn surgeries Menopause at 53 ish   Conflict between patient and family 03/26/2021   Dysmenorrhea    Heart murmur 03/26/2021   Hematuria 07/31/2017   HPV in female 2019   Hyperglycemia 03/19/2013   Hypertension    Muscle cramping 03/26/2021   Osteopenia    Osteoporosis 04/11/2010   Qualifier: Diagnosis of  By: Georgian ROSALEA CHARM Lamar    Other and unspecified hyperlipidemia 03/19/2013   Preventative health care 09/08/2011   Right calf pain 03/22/2021   Skin cancer    Skin cancer 07/14/2016   Uterine fibroid    Past Surgical History:  Procedure Laterality Date   BREAST SURGERY     biopsy left   COLPOSCOPY     GYNECOLOGIC CRYOSURGERY     MYRINGOPLASTY  1972   TONSILLECTOMY     Patient Active Problem List   Diagnosis Date Noted   Breast cancer, left (HCC) 10/30/2023   Weakness 03/20/2023   Closed wedge  compression fracture of T8 vertebra (HCC) 03/20/2023   Chronic bilateral thoracic back pain 03/20/2023   Abnormal posture 03/20/2023   Thoracic compression fracture (HCC) 11/13/2022   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 04/24/2022   History of COVID-19 03/23/2022   Aortic insufficiency 06/08/2021   Ascending aortic aneurysm (HCC) 06/08/2021   Hypertension 06/07/2021   Conflict between patient and family 03/26/2021   Heart murmur 03/26/2021   Muscle cramping 03/26/2021   Right calf pain 03/22/2021   Excessive cerumen in right ear canal 01/14/2018   HPV in female 2019   Hematuria 07/31/2017   Asymmetric SNHL (sensorineural hearing loss) 11/29/2015   Subjective tinnitus of left ear 11/29/2015   Sensorineural hearing loss (SNHL), bilateral 11/29/2015   Cervical cancer screening 03/12/2015   Contact dermatitis 07/07/2013   Hyperlipidemia, mixed 03/19/2013   Hyperglycemia 03/19/2013   Osteopenia 03/19/2013   Preventative health care 09/08/2011   Osteoporosis 04/11/2010   FIBROIDS, UTERUS 03/07/2010   ELEVATED BLOOD PRESSURE WITHOUT DIAGNOSIS OF HYPERTENSION 03/07/2010   SKIN CANCER, HX OF 03/07/2010    PCP: Domenica Harlene LABOR, MD   REFERRING PROVIDER: Domenica Harlene LABOR, MD   REFERRING DIAG: R53.1 (ICD-10-CM) - Weakness  (Osteoporosis, falls and weakness)  THERAPY DIAG:  Muscle weakness (generalized)  Pain in thoracic spine  Pain of right upper arm  RATIONALE FOR EVALUATION AND TREATMENT: Rehabilitation  ONSET DATE: 10/2022 - T8 compression fracture; 08/2023 - R upper arm pain & L lumpectomy and axillary node dissection secondary to breast cancer  NEXT MD VISIT: 05/01/24   SUBJECTIVE:   SUBJECTIVE STATEMENT: Her back is better and she can stretch out flat and sit down. B shoulders continue to be painful, especially with certain motions. States that B shoulders feel worse.  EVAL: Pt reports h/o T8 compression fracture ~1 yr ago (11/13/22) while participating in a gym  based exercise program focusing on osteoporosis (she describes standing in slight squat while lifting a resistance machine in front of her when she felt a pop in her mid back).  She was initially told it was a muscle strain and only later identified as a compression fracture on x-ray.  She did complete an episode of PT following this with some benefit noted but later when trying to 3 reduce the exercises at home started to notice limitation due to R upper arm pain.  She has been walking and doing some weightbearing exercises but has not progressed her strengthening and would like to do so.  In November she was diagnosed with breast cancer and underwent a lumpectomy and axillary node dissection on the left for which she was prescribed a HEP to prevent lymphedema.  She she continues to note some pain in her R upper arm over the past few months which limits her ability to reach with her R UE, perform her lymphedema exercises or sleep on her R side.  PAIN: Are you having pain? No: NPRS scale: 4/10 Pain location: mid back (~T8) Pain description: stiff, achy, uncomfortable Aggravating factors: cleaning (scrubbing the tub, vacuuming, esp when she does a lot in a day), otherwise unpredictable Relieving factors: heating pad, Tylenol  Are you having pain? No: NPRS scale: 5-6/10 w/ certain movements Pain location: R lateral upper arm/shoulder Pain description: quick onset, pulling Aggravating factors: laying on R side, overhead shoulder ER (pec stretch), end range OH ROM Relieving factors: stopping triggering movement  PERTINENT HISTORY: T8 compression fracture 10/2022, osteoporosis, chronic B thoracic back pain, HTN, muscle cramping, L breast cancer, aortic insufficiency, ascending aortic aneurysm, B sensorineural hearing loss  PRECAUTIONS: Other: Osteoporosis with h/o thoracic compression fx  HAND DOMINANCE: Right  RED FLAGS: None  WEIGHT BEARING RESTRICTIONS: No  FALLS:  Has patient fallen in last  6 months? 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 To get rid of the arm pain and minimize the back pain. To stay active.   OBJECTIVE: (objective measures completed at initial evaluation unless otherwise dated)  DIAGNOSTIC FINDINGS:  06/20/23 - MR thoracic spine: IMPRESSION: 1. No acute osseous injury of the thoracic spine. 2. Chronic T8 vertebral body compression fracture with 50% height loss anteriorly.  04/18/23 - XR thoracic spine: T8 compression fracture with anterior height loss.   No significant change in height loss since films on 12/12/2022. No other  fracture seen.  No spondylolisthesis seen.   PATIENT SURVEYS:  ABC scale 1390 / 1600 = 86.9 %, > 80% indicates high level of physical functioning Modified Oswestry 15 / 50 = 30.0 %, indicates moderate disability  Quick Dash 31.8 / 100 = 31.8 %  12/04/23: Modified Oswestry: 12 / 50 = 24.0 % QuickDASH: 31.8 / 100 = 31.8 %  12/27/23:    01/23/24: Modified Oswestry: 12 / 50 = 24% Quick DASH: 25 / 100 = 25%  COGNITION: Overall cognitive status: Within functional limits for tasks assessed     SENSATION: WFL  POSTURE:  rounded shoulders, forward head, and increased thoracic kyphosis  CERVICAL ROM:  Grossly WFL/WNL  UPPER EXTREMITY ROM:   Active ROM Right eval Left eval R 11/27/23 L 11/27/23 R 12/27/23 L 12/27/23 R 01/23/24 L 01/23/24  Shoulder flexion 120 124 128 129 135 ^ 131 ^ 136 130  Shoulder extension 40 ^ 46 50 50 54 50    Shoulder abduction 142 ^ 138 114 ^ 136 150 ^  140 ^ 91^ 89^  Shoulder adduction          Shoulder internal rotation FIR L3 ^ FIR T12 FIR L1 FIR T12 FIR L3 ^ FIR T12 FIR upper iliac crest ^ FIR T12^  Shoulder external rotation FER T3 ^ FER T3 FER T5 FER T5 FER T4 FER T5 FER T4  FER T4  (Blank rows = not tested, ^ - increased pain)  UPPER EXTREMITY MMT:  MMT Right eval Left eval R 12/11/23 L 12/11/23 R 12/27/23 L 12/27/23 R 01/23/24 L 01/23/24  Shoulder flexion 4 4 4+ 4+ 5 5 5 5   Shoulder extension 4 4 4+ 4 5 5 5 5   Shoulder abduction 4 4 4+ 4+ * 4+ 4+ 4+ 4+*  Shoulder adduction          Shoulder internal rotation 4+ 4+ 4+ 4+ 5 5 4+ 4+  Shoulder external rotation 4 4 4 4 5  4+ 4+ 4+  Middle trapezius 4- 4-   4 4    Lower trapezius Unable Unable   3- 3-    Elbow flexion          Elbow extension          Wrist flexion          Wrist extension          Wrist ulnar deviation          Wrist radial deviation          Wrist pronation          Wrist supination          Grip strength (lbs)          (Blank rows = not tested, * - discomfort)  MUSCLE LENGTH: Hamstrings: Mild tight B ITB: WFL Piriformis: Mild tight B Hip flexors: WFL Quads: Mild tight B  LOWER EXTREMITY ROM: Grossly WFL/WNL  LOWER EXTREMITY MMT:  MMT Right eval Left eval R 12/11/23 L 12/11/23 R 12/27/23 L 12/27/23  Hip flexion 4- 4- 4+ 4 5 4+  Hip extension 4+ 4+ 4+ 4+ 5 5  Hip abduction 4- 4- 4 4+ 4+ 5  Hip adduction 4- 4- 4- 4 4+ 4+  Hip internal rotation 4+ 4+ 4+ 4+ 5 4+  Hip external rotation 3+ 3+ 4 4- 4+ 4  Knee flexion 5 5      Knee extension 5 5      Ankle dorsiflexion 5 5  Ankle plantarflexion        Ankle inversion        Ankle eversion         (Blank rows = not tested)  BED MOBILITY:  Independent  TRANSFERS: Assistive device utilized: None  Sit to stand: Complete Independence Stand to sit: Complete Independence Chair to chair: Complete Independence Floor: NT  GAIT: Distance walked: Clinic distances Assistive device utilized: None Level of  assistance: Complete Independence Gait pattern: WFL   TODAY'S TREATMENT:  01/23/24 THERAPEUTIC EXERCISE: To improve strength, endurance, ROM, and flexibility.  Demonstration, verbal and tactile cues throughout for technique. NuStep L5 x 6 min Doorway Pec Stretch at 90 x 30' Doorway Pec Stretch at 60 abduction w/ arms straight x 30' - tolerable at this angle  NEUROMUSCULAR RE-EDUCATION: To improve coordination, kinesthesia, posture, and proprioception. Wall Slides V Standing shoulder ER against doorframe w/ YTB - cued to use doorframe to keep upright postural alignment & arms at side x 10 Standing scapular retraction w/ YTB x 10 Standing shoulder extension w/ YTB x10 Standing shoulder extension w/ RTB x10  SELF CARE: Provided education to facilitate performance of basic household cleaning/chores, to prevent loss of gains achieved with physical therapy, and and safer lifting strategies.   [facility] Adult PT Treatment:                                                DATE: 01/21/24 Pt seen for aquatic therapy today.  Treatment took place in water 3.5-4.75 ft in depth at the [facility] pool. Temp of water was 91.  Pt entered/exited the pool via stairs independently with bilat rail.   Exercises - Suitcase Carry  - Side Stepping  - Noodle press  - Standing Shoulder Horizontal Abduction Using Hand buoy   - Drawing Bow - Leg swings flex/ext - Heel Toe Raises at [facility]  - Leg Swing out to the side  - Cycling on noodle/noodle wrapped under shoulders     Pt requires the buoyancy and hydrostatic pressure of water for support, and to offload joints by unweighting joint load by at least 50 % in navel deep water and by at least 75-80% in chest to neck deep water.  Viscosity of the water is needed for resistance of strengthening. Water current perturbations provides challenge to standing balance requiring increased core activation.  [facility] Adult PT Treatment:                                                 DATE: 01/15/24 Pt seen for aquatic therapy today.  Treatment took place in water 3.5-4.75 ft in depth at the [facility] pool. Temp of water was 91.  Pt entered/exited the pool via stairs independently with bilat rail.  - unsupported walking forward/ backward, multiple laps  - side stepping with arm addct/ abdct with rainbow hand floats x 4 laps - farmer carry with bilat then single rainbow hand floats at side -> single  at side, walking forward/ backward. VC for abd bracing. - TrA and lower trap set with short hollow noodle ->full hollow noodle pull down to thighs x 10 in wide stance, x 5 in staggered stance x 5  - staggered stance with  bilat horiz abdct/ addct with rainbow hand floats x 5 each  -introduced Bow and arrow. VC and demonstration for execution.  Some discomfort shoulder with drawing back - straddling yellow noodle, no UE support, breast stroke ue: cycling in deeper water    Pt requires the buoyancy and hydrostatic pressure of water for support, and to offload joints by unweighting joint load by at least 50 % in navel deep water and by at least 75-80% in chest to neck deep water.  Viscosity of the water is needed for resistance of strengthening. Water current perturbations provides challenge to standing balance requiring increased core activation.    PATIENT EDUCATION:  Education details: aquatic exercise progressions / modifications Person educated: Patient Education method: Explanation Education comprehension: verbalized understanding  HOME EXERCISE PROGRAM: Access Code: [email or web address] URL: [email or web address] Date: 01/23/2024 Prepared by: Exander Shaul Joseph-Greene  Exercises - Development worker, international aid at 60 Degrees Abduction with Arm Straight  - 2 x daily - 7 x weekly - 3 reps - 30 sec hold - Seated Thoracic Lumbar Extension  - 1-2 x daily - 7 x weekly - 2 sets - 10 reps - 3 sec hold - Supine Shoulder External Rotation with Dowel  - 1-2 x daily  - 7 x weekly - 2 sets - 10 reps - 3 sec hold - Supine Shoulder Flexion AAROM with Dowel  - 1-2 x daily - 7 x weekly - 2 sets - 10 reps - 3 sec hold - Supine Shoulder Protraction with Dowel  - 1-2 x daily - 7 x weekly - 2 sets - 10 reps - 3 sec hold - Shoulder Flexion Wall Slide with Towel  - 1-2 x daily - 7 x weekly - 2 sets - 10 reps - 3 sec hold - Supine Shoulder Horizontal Abduction with Resistance  - 1 x daily - 3-4 x weekly - 2 sets - 10 reps - 3 sec hold - Hooklying Single Leg Bent Knee Fallouts with Resistance  - 1 x daily - 3-4 x weekly - 2 sets - 10 reps - 3 sec hold - Supine March with Resistance Band  - 1 x daily - 3-4 x weekly - 2 sets - 10 reps - 2-3 sec hold hold - Supine Hip Adduction Isometric with Ball  - 2 x daily - 3-4 x weekly - 2 sets - 10 reps - 5 sec hold - Seated Bilateral Shoulder External Rotation with Resistance  - 1 x daily - 3-4 x weekly - 2 sets - 10 reps - 3 sec hold - Standing Bilateral Low Shoulder Row with Anchored Resistance  - 1 x daily - 3-4 x weekly - 2 sets - 10 reps - 5 sec hold - Scapular Retraction with Resistance Advanced  - 1 x daily - 3-4 x weekly - 2 sets - 10 reps - 5 sec hold - Low Trap Setting at Wall  - 1 x daily - 3-4 x weekly - 2 sets - 10 reps - 3 sec hold  Patient Education - Do it right & prevent fractures - Osteoporosis education - Posture and Body Mechanics - Trigger Point Dry Needling - Kinesiology tape  Aquatic This aquatic home exercise program from [employer] utilizes pictures from land based exercises, but has been adapted prior to lamination and issuance.   Access Code: [email or web address] URL: [email or web address] Date: 01/15/2024 Prepared by: Matilda Kohut  UPDATED:Access Code: [email or web address] URL: [email or web address] Date: 01/17/2024 Prepared by: Matilda Kohut  Exercises - Suitcase Carry  - 1 x daily - 1-3  x weekly - Side Stepping  - 1 x daily - 1-3 x weekly - Noodle press  - 1 x daily - 1-3 x weekly -  1-2 sets - 5-10 reps - Standing Shoulder Horizontal Abduction Using Hand buoy  - 1 x daily - 1-3 x weekly - 1-2 sets - 10 reps - Drawing Bow  - 1 x daily - 1-3 x weekly - 1-2 sets - 5-10 reps - Leg swings flex/ext  - 1 x daily - 1-3 x weekly - 1-3 sets - 10 reps - Heel Toe Raises at Pool Wall  - 1 x daily - 1-3 x weekly - 1-2 sets - 10 reps - Leg Swing out to the side  - 1 x daily - 1-3 x weekly - 31-3 sets - 10 reps - Cycling on noodle/noodle wrapped under shoulders  - 1 x daily - 1-3 x weekly Printed but not issued   Patient Education - Do it right & prevent fractures - Osteoporosis education - Posture and Body Mechanics - Trigger Point Dry Needling - Kinesiology tape   ASSESSMENT:  CLINICAL IMPRESSION: Dilyn shows slight improvement in shoulder motion but also a decline in shoulder IR/ER ROM and MMT as well as deltoid tightness. Though she shows declines in some motions, QuickDASH conveys improvement from 31.8% to 24% which is a 7.8% increase in functional activity. Reviewed current HEP as she transitions to home program to ensure exercises are executed correctly. PT suggested to perform strengthening HEP once a day or every other day which PT feels is beneficial for her to prevent irritation and allow for recuperation. Stretches will be beneficial to do everyday. She feels ready to transition to home program as she demonstrates exercises appropriately. Goal is to continue to do HEP and get back into the gym but needs to avoid forward or overhead motions to avoid compression on the spine. Alfreda will be placed on 30 day hold in case she decides to return to PT.  OBJECTIVE IMPAIRMENTS: decreased activity tolerance, decreased knowledge of condition, difficulty walking, decreased ROM, decreased strength, decreased safety awareness, hypomobility, increased fascial restrictions, impaired perceived functional ability, increased muscle spasms, impaired flexibility, impaired UE functional use,  improper body mechanics, postural dysfunction, and pain.   ACTIVITY LIMITATIONS: carrying, lifting, bending, sitting, standing, reach over head, locomotion level, and caring for others  PARTICIPATION LIMITATIONS: meal prep, cleaning, laundry, driving, shopping, community activity, and yard work  PERSONAL FACTORS: Past/current experiences, Time since onset of injury/illness/exacerbation, and 3+ comorbidities: T8 compression fracture 10/2022, osteoporosis, chronic B thoracic back pain, HTN, muscle cramping, L breast cancer, aortic insufficiency, ascending aortic aneurysm, B sensorineural hearing loss are also affecting patient's functional outcome.   REHAB POTENTIAL: Excellent  CLINICAL DECISION MAKING: Evolving/moderate complexity  EVALUATION COMPLEXITY: Moderate   GOALS: Goals reviewed with patient? Yes  SHORT TERM GOALS: Target date: 12/06/2023   Patient will be independent with initial HEP. Baseline: HEP initiated on eval Goal status: MET - 11/27/23   2.  Patient will report 25% improvement in mid back & R UE pain to improve QOL. Baseline: Thoracic back pain up to 5/10, R upper arm pain up to 4-5/10 Goal status: PARTIALLY MET - 12/27/23 - back pain 30% improved but still more likely to persist when present; R upper arm pain 15-20% improved with pain typically only momentary with certain movements now and does not seem to linger; 01/23/24 - 50% improvement in back and no improvement in upper arm  LONG TERM GOALS: Target date: 01/03/2024;  extended to 02/07/2024  Patient will be independent with ongoing/advanced HEP for self-management at home.  Baseline:  Goal status: PARTIALLY MET - 12/27/23 - met for land-based HEP; Met for aquatics 01/21/24  2.  Patient will report 50-75% improvement in low back and R UE pain to improve QOL.  Baseline:  Thoracic back pain up to 5/10, R upper arm pain up to 4-5/10 Goal status: MET - 12/27/23 - back pain 30% improved but still more likely to persist when  present; R upper arm pain 15-20% improved with pain typically only momentary with certain movements now and does not seem to linger; 01/22/24 - 50% improvement  3.  Patient to demonstrate awareness of osteoporosis precautions as well as ability to achieve and maintain good spinal alignment/posturing and body mechanics needed for daily activities. Baseline:  Goal status: MET - 11/22/23  4.  Patient will demonstrate functional pain free B shoulder ROM to perform ADLs.   Baseline: Refer to above UE ROM table Goal status: PARTIALLY MET - 12/27/23 - B shoulders improving in most planes however pain still limiting OH ROM  5  Patient will demonstrate improved B shoulder and scapular strength to >/= 4+/5 for functional UE use Baseline: Refer to above UE MMT table Goal status: PARTIALLY MET - 12/27/23 - B shoulders now 4+ to 5/5 but scapular strength remains weaker  6.  Patient will demonstrate improved B LE strength to >/= 4+/5 for improved stability and ease of mobility. Baseline: Refer to above LE MMT table Goal status: MET - 12/27/23 - met except L hip ER 4/5  7.  Patient will report </= 22% on QuickDASH (MCID = 10 pts) to demonstrate improved functional UE use with decreased pain interference. Baseline: 31.8 / 100 = 31.8 %; 12/04/23 - 31.8 / 100 = 31.8 % Goal status: NOT MET - 12/27/23 -  27.3 / 100 = 27.3 %; 01/23/24 - 25.0 / 100 = 25.0 %  8. Patient will report </= 18% on Modified Oswestry (MCID = 12%) to demonstrate improved functional ability with decreased pain interference. Baseline: 15 / 50 = 30.0 %; 12/04/23 - 12 / 50 = 24.0 % Goal status: NOT MET - 12/27/23 - 11 / 50 = 22.0 %; 01/23/24 - 12/50 = 24%   PLAN:  PT FREQUENCY: 2x/week  PT DURATION:  4-6 weeks   PLANNED INTERVENTIONS: 02835- PT Re-evaluation, 97110-Therapeutic exercises, 97530- Therapeutic activity, 97112- Neuromuscular re-education, 97535- Self Care, 02859- Manual therapy, 97116- Gait training, [phone]- Aquatic Therapy, [phone]-  Electrical stimulation (unattended), [phone]- Electrical stimulation (manual), 97016- Vasopneumatic device, [id number]- Ultrasound, [id number]- Ionotophoresis 4mg /ml Dexamethasone, Patient/Family education, Balance training, Stair training, Taping, Dry Needling, Joint mobilization, Spinal mobilization, Cryotherapy, Moist heat, and 97750- Physical Performance Test or Measurement  PLAN FOR NEXT SESSION: Pt will be placed on 30 day hold  Shawnee Gambone Joseph-Greene, SPT 01/23/24 11:12 AM  PHYSICAL THERAPY DISCHARGE SUMMARY  Visits from Start of Care: 18  Current functional level related to goals / functional outcomes: Refer to above clinical impression and goal assessment for status as of last visit on 01/23/2024. Patient was placed on hold for 30 days and has not needed to return to PT, therefore will proceed with discharge from PT for this episode.     Remaining deficits: As above.   Education / Equipment: HEP   Patient agrees to discharge. Patient goals were partially met. Patient is being discharged due to being pleased with the current functional level.  Elijah EMERSON Hidden, PT 05/20/2024, 10:34 AM  [facility] [address]  [address] [place], [place], [address] Phone: [phone]   Fax:  [phone]      Date of referral: 10/30/2023 Referring provider: Domenica Harlene LABOR, MD Referring diagnosis? R53.1 (ICD-10-CM) - Weakness  (Osteoporosis, falls and weakness) Treatment diagnosis? (if different than referring diagnosis)  Muscle weakness (generalized)  Pain in thoracic spine  Pain of right upper arm  What was this (referring dx) caused by? Ongoing Issue and Other: Weakness related to osteoporosis with T8 compression fracture  Lysle of Condition: Chronic (continuous duration > 3 months)   Laterality: Both  Current Functional Measure Score: Modified Oswestry: 12 / 50 = 24% & Quick DASH: 25 / 100 = 25%  Objective measurements identify  impairments when they are compared to normal values, the uninvolved extremity, and prior level of function.  [x]  Yes  []  No  Objective assessment of functional ability: Moderate functional limitations   Briefly describe symptoms: Letrice reports her back pain is not as frequent but feels her B shoulder/upper arm pain, while only short-lived when present, is more limiting functionally affecting daily activities such as dressing and reaching overhead.  B shoulder ROM has improved in most planes but remains limited by pain in overhead ROM bilaterally and FIR on right. Overall B shoulder strength now 4+ to 5/5 with exception of scapular muscles at 4/5 for middle traps and 3-/5 for lower traps.   Proximal LE strength also improving with overall MMT 4+ to 5/5 with exception of L hip ER at 4/5.  Gains noted in patient reported outcome measures on both QuickDASH and modified Oswestry but not yet to anticipated goals levels.     Thoracic back pain from history of T8 compression fracture 1 year ago.  She also notes recent onset of R upper arm pain originating around the time she underwent lumpectomy and axillary node dissection for breast cancer. Current deficits include pain as described above, postural abnormalities, core and postural weakness with limited proximal UE ROM and strength as well as proximal LE weakness.    How did symptoms start: Sudden onset for thoracic back pain at time of T8 compression fracture on 11/13/2022.  Gradual onset for R upper arm pain.  Weakness as a sequelae to both.  Average pain intensity:  Last 24 hours: 2/10  Past week: 2/10  How often does the pt experience symptoms? Intermittently  How much have the symptoms interfered with usual daily activities? A little bit  How has condition changed since care began at this facility? A little better  In general, how is the patients overall health? Good  Onset date: 10/2022 - T8 compression fracture; 08/2023 - R upper arm pain & L  lumpectomy and axillary node dissection secondary to breast cancer   BACK PAIN (STarT Back Screening Tool) - (When applicable):  Has your back pain spread down your leg(s) at sometime in the last 2 weeks? []  Yes   [x]  No Have you had pain in the shoulder or neck at sometime in the past 2 weeks? [x]  Yes   []  No Have you only walked short distances because of your back pain? []  Yes   [x]  No In the past 2 weeks, have you dressed more slowly than usual because of your back pain? []  Yes   [x]  No Do you think it is not really safe for person with a condition like yours to be physically active? []  Yes   [x]  No Have worrying  thoughts been going through your mind a lot of the time? []  Yes   [x]  No Do you feel that your back pain is terrible and it is never going to get any better? []  Yes   [x]  No In general, have you stopped enjoying all the things you usually enjoy? []  Yes   [x]  No Overall, how bothersome has your back pain been in the last 2 weeks? []  Not at all   []  Slightly     [x]  Moderate   []  Very much     []  Extremely

## 2024-02-13 LAB — VITAMIN D 25 HYDROXY (VIT D DEFICIENCY, FRACTURES): Vit D, 25-Hydroxy: 62

## 2024-02-27 ENCOUNTER — Other Ambulatory Visit (INDEPENDENT_AMBULATORY_CARE_PROVIDER_SITE_OTHER)

## 2024-02-27 ENCOUNTER — Ambulatory Visit (INDEPENDENT_AMBULATORY_CARE_PROVIDER_SITE_OTHER): Admitting: Orthopedic Surgery

## 2024-02-27 DIAGNOSIS — M25512 Pain in left shoulder: Secondary | ICD-10-CM | POA: Diagnosis not present

## 2024-02-27 DIAGNOSIS — M25511 Pain in right shoulder: Secondary | ICD-10-CM | POA: Diagnosis not present

## 2024-02-27 NOTE — Progress Notes (Signed)
 Orthopedic Surgery Progress Note   Assessment: Patient is a 68 y.o. female with bilateral shoulder pain   Plan: - I went over some of the etiologies of shoulder pain with her including but not limited to rotator cuff tears, labral tears, arthritis, fractures.  Based on her exam, it seems to be more early arthritis than anything else because it only hurts with extremes of motion while her rotator cuff and testing for labral issues is negative.  She also has no fracture seen on the radiographs and there is no trauma that preceded the onset of this pain. -I told her to continue to use Tylenol and can sparingly use ibuprofen.  She can use up to 1000 mg of Tylenol 3 times per day. -She should work on deltoid strengthening - If pain gets bad enough, would consider a diagnostic/therapeutic intra-articular injection - Patient return to the office on an as-needed basis  ___________________________________________________________________________  History: Patient is a 68 year old female that I had previously seen in the office for a compression fracture.  She eventually went on to heal that fracture.  She then was diagnosed with breast cancer and underwent a lumpectomy and sentinel lymph node biopsy.  She said after that surgery she started noticed shoulder pain.  She feels it in both shoulders.  She notices at the extremes of motion.  She gives the example of reaching into the kitchen with her arms fully extended she will notice the pain.  She feels on the lateral aspect of the shoulder in the area of the deltoid.  She said it will last for about 30 seconds and then go away.  She does not have any consistent pain in the shoulder.  She has been doing physical therapy since that help with her back but it has not proven helpful with her shoulder.  She had done 3 months of PT and is now doing a exercise program at the gym.  She has no pain radiating further down her arms.  No neck pain.     Physical  Exam:  General: no acute distress, appears stated age Neurologic: alert, answering questions appropriately, following commands Respiratory: unlabored breathing on room air, symmetric chest rise Psychiatric: appropriate affect, normal cadence to speech  MSK:   - Right shoulder exam: Pain with external rotation past 80 degrees, no pain through remainder of range of motion, negative Jobe, negative belly press, no weakness with external rotation with arm at side, negative O'Brien's, negative speeds, no tenderness palpation over the shoulder  -Left shoulder exam: Pain with external rotation past 70 degrees with palpable crepitus, pain with internal rotation past 60 degrees, negative Jobe, negative speeds, negative O'Brien's, negative belly press, no weakness with external rotation with arm at side  Imaging:   XRs of the left shoulder from 02/27/2024 were independently reviewed and interpreted, showing AC joint narrowing.  No significant degenerative changes within the glenohumeral joint.  No fracture or dislocation seen.  XRs of the right shoulder from 02/27/2024 were independently reviewed and interpreted, showing AC joint space narrowing.  No significant degenerative changes within the glenohumeral joint.  No fracture or dislocation seen.   Patient name: Yolanda Hernandez Patient MRN: [id number] Date: 02/27/24

## 2024-02-29 ENCOUNTER — Telehealth: Payer: Self-pay | Admitting: Family Medicine

## 2024-02-29 NOTE — Telephone Encounter (Signed)
 Copied from CRM [id number]. Topic: General - Other >> Feb 29, 2024  3:14 PM Chuck Crater wrote: Reason for CRM: Patient is calling to check on her prior Authorization for medical massage. She states that she haven't heard anything back from anyone. Please call patient.

## 2024-03-04 ENCOUNTER — Telehealth: Payer: Self-pay | Admitting: Family Medicine

## 2024-03-04 NOTE — Telephone Encounter (Signed)
 Copied from CRM [id number]. Topic: Referral - Prior Authorization Question >> Mar 04, 2024  9:25 AM Dewanda Foots wrote: Reason for CRM: Pt called in stating that we are getting it a little confused. She has already done PT and does not need a referral to PT. She is currently paying out of pocket for medical massages, but was told by [facility] that they would cover this for her.  Pt spoke to [facility] and they did state they would cover the medical massage for her but would require prior authorization from the PCP in order for that to happen. Pt states that she came into the office and spoke to the Dr and she agreed to do this for her and said there are some forms that need to be filled out and that she would have the person who handles the referrals complete these forms for her to get this approved.  Patient initiated this on 01/08/2024 and would just like to get this handled please.  Please call patient back at [phone] to discuss and resolve as soon as you can.  Thank you so much!

## 2024-03-04 NOTE — Telephone Encounter (Signed)
 Called patient and no answer left vm to return call

## 2024-03-05 NOTE — Telephone Encounter (Signed)
 Spoke with patient and advised that we will call [employer].  Looks like we should have got forms back at beginning of April (no where in chart).  Also will reach out to referral coordinator to see if she has the number that she called and maybe get forms sent again.

## 2024-03-06 ENCOUNTER — Other Ambulatory Visit: Payer: Self-pay | Admitting: Cardiology

## 2024-03-06 NOTE — Telephone Encounter (Signed)
 Called to let pt know we are still awaiting response from [facility].  She was ok with waiting.

## 2024-03-11 NOTE — Telephone Encounter (Signed)
 Followed up on this matter on another telephone encounter.

## 2024-03-13 NOTE — Telephone Encounter (Signed)
 Pt has appt on 03/20/24 to discuss

## 2024-03-16 NOTE — Assessment & Plan Note (Signed)
 Hydrate and monitor

## 2024-03-16 NOTE — Assessment & Plan Note (Signed)
 Well controlled, no changes to meds. Encouraged heart healthy diet such as the DASH diet and exercise as tolerated.

## 2024-03-16 NOTE — Assessment & Plan Note (Signed)
 Encourage heart healthy diet such as MIND or DASH diet, increase exercise, avoid trans fats, simple carbohydrates and processed foods, consider a krill or fish or flaxseed oil cap daily. Tolerating Rosuvastatin

## 2024-03-16 NOTE — Assessment & Plan Note (Signed)
 [id number] acceptable, minimize simple carbs. Increase exercise as tolerated.

## 2024-03-17 ENCOUNTER — Ambulatory Visit: Admitting: Orthopedic Surgery

## 2024-03-20 ENCOUNTER — Encounter: Payer: Self-pay | Admitting: Family Medicine

## 2024-03-20 ENCOUNTER — Telehealth (INDEPENDENT_AMBULATORY_CARE_PROVIDER_SITE_OTHER): Admitting: Family Medicine

## 2024-03-20 DIAGNOSIS — R739 Hyperglycemia, unspecified: Secondary | ICD-10-CM | POA: Diagnosis not present

## 2024-03-20 DIAGNOSIS — E782 Mixed hyperlipidemia: Secondary | ICD-10-CM | POA: Diagnosis not present

## 2024-03-20 DIAGNOSIS — R252 Cramp and spasm: Secondary | ICD-10-CM

## 2024-03-20 DIAGNOSIS — I10 Essential (primary) hypertension: Secondary | ICD-10-CM | POA: Diagnosis not present

## 2024-03-20 NOTE — Progress Notes (Signed)
 MyChart Video Visit    Virtual Visit via Video Note   This patient is at least at moderate risk for complications without adequate follow up. This format is felt to be most appropriate for this patient at this time. Physical exam was limited by quality of the video and audio technology used for the visit. Yolanda Hernandez was able to get the patient set up on a video visit.  Patient location: home Patient and provider in visit Provider location: Office  I discussed the limitations of evaluation and management by telemedicine and the availability of in person appointments. The patient expressed understanding and agreed to proceed.  Visit Date: 03/20/2024  Today's healthcare provider: Randie Bustle, MD     Subjective:    Patient ID: Yolanda Hernandez, female    DOB: [birth year], 68 y.o.   MRN: [id number]  No chief complaint on file.   HPI Discussed the use of AI scribe software for clinical note transcription with the patient, who gave verbal consent to proceed.  History of Present Illness Yolanda Hernandez is a 68 year old female who presents with shoulder pain and decreased range of motion.  She has been experiencing pain and tightness in the deltoid area, underneath her armpits, and in the upper part of her arms. The pain is exacerbated by movements such as reaching forward, reaching up high, or lifting her arm to apply deodorant. The pain is moderate when it occurs and subsides quickly after the movement stops. The symptoms began around November 2024, following breast surgery, and she associates the tightness with the area where a sentinel node biopsy was performed.  She completed physical therapy, which helped alleviate some back pain from a previous T8 compression fracture, but did not improve her shoulder mobility. She continues to perform aquatic therapy exercises and has been receiving massage therapy approximately once a month, which she feels has helped, particularly when the therapist  worked on the area under her left arm, the side of her breast surgery.  X-rays performed by an orthopedist did not reveal any structural issues. Her pain is described as 8 out of 10 with certain movements, but she experiences no pain when still. The symptoms interfere with her daily activities slightly, and she describes her overall health as good.  She has had three massage therapy visits in the last 90 days. Her symptoms are intermittent to occasional, depending on her movements.  No recent falls or motor vehicle accidents. No surgery for the shoulder pain.    Past Medical History:  Diagnosis Date   Cerumen impaction    chronic   Cervical cancer screening 03/12/2015   Menarche at 10 Regular and moderate flow  history of abnormal pap in past, bx and cryo once many years ago, normal since. Last 2011 and normal G0P0, s/p No history of abnormal MGM Last in 2015, no breast concerns  No concerns today Biopsy, cervical: gyn surgeries Menopause at 53 ish   Conflict between patient and family 03/26/2021   Dysmenorrhea    Heart murmur 03/26/2021   Hematuria 07/31/2017   HPV in female 2019   Hyperglycemia 03/19/2013   Hypertension    Muscle cramping 03/26/2021   Osteopenia    Osteoporosis 04/11/2010   Qualifier: Diagnosis of  By: Marthe Slain    Other and unspecified hyperlipidemia 03/19/2013   Preventative health care 09/08/2011   Right calf pain 03/22/2021   Skin cancer    Skin cancer 07/14/2016   Uterine fibroid  Past Surgical History:  Procedure Laterality Date   BREAST SURGERY     biopsy left   COLPOSCOPY     GYNECOLOGIC CRYOSURGERY     MYRINGOPLASTY  1972   TONSILLECTOMY      Family History  Problem Relation Age of Onset   Osteoporosis Mother    Hyperlipidemia Mother    Hypertension Mother    Macular degeneration Mother    Arthritis Mother    Hearing loss Mother    Vision loss Mother    Heart disease Father    Diabetes Father    Hyperlipidemia Father     Hypertension Father    Asthma Father    Diabetes Sister    Heart disease Sister        s/p cardiac stent   Hyperlipidemia Sister    Hypertension Sister    Osteoporosis Sister    Colon cancer Maternal Uncle    Heart disease Paternal Uncle    Arthritis Maternal Grandmother    Vision loss Maternal Grandmother    Heart disease Paternal Grandfather    Kidney disease Other    Alcohol abuse Other    Esophageal cancer Neg Hx    Stomach cancer Neg Hx    Rectal cancer Neg Hx     Social History   Socioeconomic History   Marital status: Married    Spouse name: Not on file   Number of children: Not on file   Years of education: Not on file   Highest education level: Master's degree (e.g., MA, MS, MEng, MEd, MSW, MBA)  Occupational History   Not on file  Tobacco Use   Smoking status: Never   Smokeless tobacco: Never  Vaping Use   Vaping status: Never Used  Substance and Sexual Activity   Alcohol use: Yes    Comment: Less than 1 glass of wine or beer per week   Drug use: Never    Comment: older than 15, less than 5   Sexual activity: Yes    Birth control/protection: Post-menopausal  Other Topics Concern   Not on file  Social History Narrative   Not on file   Social Drivers of Health   Financial Resource Strain: Low Risk  (10/11/2023)   Overall Financial Resource Strain (CARDIA)    Difficulty of Paying Living Expenses: Not hard at all  Food Insecurity: No Food Insecurity (10/11/2023)   Hunger Vital Sign    Worried About Running Out of Food in the Last Year: Never true    Ran Out of Food in the Last Year: Never true  Transportation Needs: No Transportation Needs (10/11/2023)   PRAPARE - Administrator, Civil Service (Medical): No    Lack of Transportation (Non-Medical): No  Physical Activity: Sufficiently Active (10/11/2023)   Exercise Vital Sign    Days of Exercise per Week: 5 days    Minutes of Exercise per Session: 60 min  Stress: No Stress Concern Present  (10/11/2023)   [employer] of Occupational Health - Occupational Stress Questionnaire    Feeling of Stress : Only a little  Social Connections: Socially Integrated (10/11/2023)   Social Connection and Isolation Panel [NHANES]    Frequency of Communication with Friends and Family: More than three times a week    Frequency of Social Gatherings with Friends and Family: Once a week    Attends Religious Services: More than 4 times per year    Active Member of [employer] or Organizations: Yes    Attends Club or  Organization Meetings: More than 4 times per year    Marital Status: Married  Recent Concern: Social Connections - Moderately Isolated (10/04/2023)   Social Connection and Isolation Panel [NHANES]    Frequency of Communication with Friends and Family: More than three times a week    Frequency of Social Gatherings with Friends and Family: More than three times a week    Attends Religious Services: Never    Database administrator or Organizations: No    Attends Banker Meetings: Never    Marital Status: Married  Catering manager Violence: Unknown (12/04/2022)   Received from [employer], [facility]   HITS    Physically Hurt: Not on file    Insult or Talk Down To: Not on file    Threaten Physical Harm: Not on file    Scream or Curse: Not on file    Outpatient Medications Prior to Visit  Medication Sig Dispense Refill   Abaloparatide (TYMLOS) 3120 MCG/1.56ML SOPN Inject 80 mcg into the skin daily.     hydrochlorothiazide (HYDRODIURIL) 12.5 MG tablet Take 12.5 mg by mouth daily.     Multiple Vitamins-Minerals (PRESERVISION AREDS 2+MULTI VIT PO) Take 1 tablet by mouth in the morning and at bedtime.     Probiotic Product (PROBIOTIC PO) Take 1 tablet by mouth daily.     rosuvastatin  (CRESTOR ) 10 MG tablet Take 1 tablet (10 mg total) by mouth daily. 90 tablet 0   tamoxifen (NOLVADEX) 20 MG tablet Take 20 mg by mouth daily.     tretinoin  (RETIN-A ) 0.05 % cream APPLY  TOPICALLY AT BEDTIME 45 g 5   triamcinolone  cream (KENALOG ) 0.1 % Apply 1 Application topically 2 (two) times daily. 30 g 0   Vitamin D , Ergocalciferol , (DRISDOL ) 1.25 MG (50000 UNIT) CAPS capsule Take 1 capsule (50,000 Units total) by mouth every 7 (seven) days. 4 capsule 4   No facility-administered medications prior to visit.    Allergies  Allergen Reactions   Iodine Rash   Sulfa Antibiotics Rash    Review of Systems  Constitutional:  Negative for fever and malaise/fatigue.  HENT:  Negative for congestion.   Eyes:  Negative for blurred vision.  Respiratory:  Negative for shortness of breath.   Cardiovascular:  Negative for chest pain, palpitations and leg swelling.  Gastrointestinal:  Negative for abdominal pain, blood in stool and nausea.  Genitourinary:  Negative for dysuria and frequency.  Musculoskeletal:  Positive for joint pain and myalgias. Negative for falls.  Skin:  Negative for rash.  Neurological:  Negative for dizziness, loss of consciousness and headaches.  Endo/Heme/Allergies:  Negative for environmental allergies.  Psychiatric/Behavioral:  Negative for depression. The patient is not nervous/anxious.        Objective:     Physical Exam Constitutional:      General: She is not in acute distress.    Appearance: Normal appearance. She is not ill-appearing or toxic-appearing.  HENT:     Head: Normocephalic and atraumatic.     Right Ear: External ear normal.     Left Ear: External ear normal.     Nose: Nose normal.  Eyes:     General:        Right eye: No discharge.        Left eye: No discharge.  Pulmonary:     Effort: Pulmonary effort is normal.  Skin:    Findings: No rash.  Neurological:     Mental Status: She is alert and oriented to person,  place, and time.  Psychiatric:        Behavior: Behavior normal.     There were no vitals taken for this visit. Wt Readings from Last 3 Encounters:  10/30/23 126 lb 3.2 oz (57.2 kg)  10/12/23 128 lb 6.4  oz (58.2 kg)  10/04/23 126 lb (57.2 kg)       Assessment & Plan:  Hyperglycemia Assessment & Plan: hgba1c acceptable, minimize simple carbs. Increase exercise as tolerated.    Hyperlipidemia, mixed Assessment & Plan: Encourage heart healthy diet such as MIND or DASH diet, increase exercise, avoid trans fats, simple carbohydrates and processed foods, consider a krill or fish or flaxseed oil cap daily. Tolerating Rosuvastatin    Primary hypertension Assessment & Plan: Well controlled, no changes to meds. Encouraged heart healthy diet such as the DASH diet and exercise as tolerated.    Muscle cramping Assessment & Plan: Hydrate and monitor       Assessment and Plan Assessment & Plan Shoulder Pain with Decreased Range of Motion Shoulder pain post-breast surgery, moderate intensity, with decreased range of motion. Orthopedist ruled out structural issues, possible early arthritis. Massage therapy beneficial. - Submit request for 24 weeks of massage therapy, 24 visits, starting April 07, 2024. - Continue current exercises, including aquatic therapy.     I discussed the assessment and treatment plan with the patient. The patient was provided an opportunity to ask questions and all were answered. The patient agreed with the plan and demonstrated an understanding of the instructions.   The patient was advised to call back or seek an in-person evaluation if the symptoms worsen or if the condition fails to improve as anticipated.  Randie Bustle, MD [facility] at [facility] [phone] (phone) [phone] (fax)  [facility]

## 2024-03-21 ENCOUNTER — Telehealth: Payer: Self-pay | Admitting: Family Medicine

## 2024-03-21 NOTE — Telephone Encounter (Signed)
 Pt came in and completed forms that she needed to fill out. Placed on Cma's desk

## 2024-03-24 NOTE — Telephone Encounter (Signed)
 Form was faxed 6/6

## 2024-04-07 ENCOUNTER — Telehealth: Payer: Self-pay | Admitting: Family Medicine

## 2024-04-07 NOTE — Telephone Encounter (Signed)
 Copied from CRM [id number]. Topic: General - Other >> Apr 07, 2024 11:28 AM Cristopher B wrote: Reason for CRM: pt called in because she received a letter from her insurance stating that she got approved for Physical therapy when its suppose to be for Medical massage . If possible can someone can please look into this for her and give her a call

## 2024-04-07 NOTE — Telephone Encounter (Signed)
 Returned patients call to advised that the massage paperwork was faxed that she had signed and the office have not received any paperwork about physical therapy but no answer left vm to return call.

## 2024-04-07 NOTE — Telephone Encounter (Signed)
 Pt returned clinical call . Pt requesting a call back for further clarification.

## 2024-04-08 NOTE — Telephone Encounter (Signed)
 Returned patients call and no answer left vm to return call.

## 2024-04-09 NOTE — Telephone Encounter (Signed)
 Copied from CRM [id number]. Topic: General - Call Back - No Documentation >> Apr 09, 2024 11:15 AM Berneda FALCON wrote: Reason for CRM: Pt returning phone call to [facility]. States she had some additional information to ask her and wanted to update her on the insurance approval/denial. I let her know what was in the chart notes about the referral and patient states that the insurance approved physical therapy, but this is not what the patient needs or wants. She is requesting an approval for a medical massage.  I let the patient know I would send this information back but she is requesting to speak to the referral coordinator or someone who can resolve this issue for her today.  Called the CAL again  Patient callback is [phone]

## 2024-04-10 NOTE — Telephone Encounter (Signed)
Returned patient's call and no answer.

## 2024-04-11 NOTE — Telephone Encounter (Signed)
 Returned patients call and she was advised that form was faxed for medical massage and she stated that insurance company send a denial letter for PT. Spoke with [employer] and they stated that medical massage is not covered by members plan.  Patient was advised per insurance her claim does not cover medical massages and she stated that she was advised back in March they do and was going to reach back out to them.  Copied from CRM [id number]. Topic: General - Call Back - No Documentation >> Apr 10, 2024  4:54 PM DeAngela L wrote: Reason for CRM: Patient returned call for [facility] in office

## 2024-04-22 NOTE — Assessment & Plan Note (Addendum)
 Hx of compression fracture. Encourage adequate exercise, calcium  and vitamin d  intake. She has been working with physical therapy for strength training and fall prevention.Continue weight-bearing exercises like walking.

## 2024-04-22 NOTE — Assessment & Plan Note (Signed)
 [id number] acceptable, minimize simple carbs. Increase exercise as tolerated.

## 2024-04-22 NOTE — Assessment & Plan Note (Signed)
 Well controlled, no changes to meds. Encouraged heart healthy diet such as the DASH diet and exercise as tolerated.

## 2024-04-22 NOTE — Progress Notes (Unsigned)
 Subjective:     Patient ID: Yolanda Hernandez Perfect, female    DOB: Aug 25, 1956, 67 y.o.   MRN: [id number]  No chief complaint on file.   HPI  Yolanda Hernandez is a 68 year old female who presents for follow-up.  History of hypertension, osteoporosis,  Followed by  Hem/Onc- Hx of left breast Ca, nonmelenoma skin cancers, Osteoporosis, Hx of   She has been taking hydrochlorothiazide for high calcium  levels in her urine and is due to start tamoxifen for her breast cancer.   Hydrochlorothiazide 12.5 mg tab aily ***  Patient denies fever, chills, SOB, CP, palpitations, dyspnea, edema, HA, vision changes, N/V/D, abdominal pain, urinary symptoms, rash, weight changes, and recent illness or hospitalizations.   History of Present Illness              Health Maintenance Due  Topic Date Due   COVID-19 Vaccine (11 - 2024-25 season) 03/20/2024    Past Medical History:  Diagnosis Date   Cerumen impaction    chronic   Cervical cancer screening 03/12/2015   Menarche at 10 Regular and moderate flow  history of abnormal pap in past, bx and cryo once many years ago, normal since. Last 2011 and normal G0P0, s/p No history of abnormal MGM Last in 2015, no breast concerns  No concerns today Biopsy, cervical: gyn surgeries Menopause at 53 ish   Conflict between patient and family 03/26/2021   Dysmenorrhea    Heart murmur 03/26/2021   Hematuria 07/31/2017   HPV in female 2019   Hyperglycemia 03/19/2013   Hypertension    Muscle cramping 03/26/2021   Osteopenia    Osteoporosis 04/11/2010   Qualifier: Diagnosis of  By: Georgian ROSALEA CHARM Lamar    Other and unspecified hyperlipidemia 03/19/2013   Preventative health care 09/08/2011   Right calf pain 03/22/2021   Skin cancer    Skin cancer 07/14/2016   Uterine fibroid     Past Surgical History:  Procedure Laterality Date   BREAST SURGERY     biopsy left   COLPOSCOPY     GYNECOLOGIC CRYOSURGERY     MYRINGOPLASTY  1972   TONSILLECTOMY       Family History  Problem Relation Age of Onset   Osteoporosis Mother    Hyperlipidemia Mother    Hypertension Mother    Macular degeneration Mother    Arthritis Mother    Hearing loss Mother    Vision loss Mother    Heart disease Father    Diabetes Father    Hyperlipidemia Father    Hypertension Father    Asthma Father    Diabetes Sister    Heart disease Sister        s/p cardiac stent   Hyperlipidemia Sister    Hypertension Sister    Osteoporosis Sister    Colon cancer Maternal Uncle    Heart disease Paternal Uncle    Arthritis Maternal Grandmother    Vision loss Maternal Grandmother    Heart disease Paternal Grandfather    Kidney disease Other    Alcohol abuse Other    Esophageal cancer Neg Hx    Stomach cancer Neg Hx    Rectal cancer Neg Hx     Social History   Socioeconomic History   Marital status: Married    Spouse name: Not on file   Number of children: Not on file   Years of education: Not on file   Highest education level: Master's degree (e.g., MA, MS, MEng, MEd, MSW, MBA)  Occupational History   Not on file  Tobacco Use   Smoking status: Never   Smokeless tobacco: Never  Vaping Use   Vaping status: Never Used  Substance and Sexual Activity   Alcohol use: Yes    Comment: Less than 1 glass of wine or beer per week   Drug use: Never    Comment: older than 16, less than 5   Sexual activity: Yes    Birth control/protection: Post-menopausal  Other Topics Concern   Not on file  Social History Narrative   Not on file   Social Drivers of Health   Financial Resource Strain: Low Risk  (10/11/2023)   Overall Financial Resource Strain (CARDIA)    Difficulty of Paying Living Expenses: Not hard at all  Food Insecurity: No Food Insecurity (10/11/2023)   Hunger Vital Sign    Worried About Running Out of Food in the Last Year: Never true    Ran Out of Food in the Last Year: Never true  Transportation Needs: No Transportation Needs (10/11/2023)    PRAPARE - Administrator, Civil Service (Medical): No    Lack of Transportation (Non-Medical): No  Physical Activity: Sufficiently Active (10/11/2023)   Exercise Vital Sign    Days of Exercise per Week: 5 days    Minutes of Exercise per Session: 60 min  Stress: No Stress Concern Present (10/11/2023)   [employer] of Occupational Health - Occupational Stress Questionnaire    Feeling of Stress : Only a little  Social Connections: Socially Integrated (10/11/2023)   Social Connection and Isolation Panel    Frequency of Communication with Friends and Family: More than three times a week    Frequency of Social Gatherings with Friends and Family: Once a week    Attends Religious Services: More than 4 times per year    Active Member of [employer] or Organizations: Yes    Attends Engineer, structural: More than 4 times per year    Marital Status: Married  Recent Concern: Social Connections - Moderately Isolated (10/04/2023)   Social Connection and Isolation Panel    Frequency of Communication with Friends and Family: More than three times a week    Frequency of Social Gatherings with Friends and Family: More than three times a week    Attends Religious Services: Never    Database administrator or Organizations: No    Attends Banker Meetings: Never    Marital Status: Married  Catering manager Violence: Unknown (12/04/2022)   Received from [facility]   HITS    Physically Hurt: Not on file    Insult or Talk Down To: Not on file    Threaten Physical Harm: Not on file    Scream or Curse: Not on file    Outpatient Medications Prior to Visit  Medication Sig Dispense Refill   Abaloparatide (TYMLOS) 3120 MCG/1.56ML SOPN Inject 80 mcg into the skin daily.     hydrochlorothiazide (HYDRODIURIL) 12.5 MG tablet Take 12.5 mg by mouth daily.     Multiple Vitamins-Minerals (PRESERVISION AREDS 2+MULTI VIT PO) Take 1 tablet by mouth in the morning and at bedtime.      Probiotic Product (PROBIOTIC PO) Take 1 tablet by mouth daily.     rosuvastatin  (CRESTOR ) 10 MG tablet Take 1 tablet (10 mg total) by mouth daily. 90 tablet 0   tamoxifen (NOLVADEX) 20 MG tablet Take 20 mg by mouth daily.     tretinoin  (RETIN-A ) 0.05 % cream APPLY  TOPICALLY AT BEDTIME 45 g 5   triamcinolone  cream (KENALOG ) 0.1 % Apply 1 Application topically 2 (two) times daily. 30 g 0   Vitamin D , Ergocalciferol , (DRISDOL ) 1.25 MG (50000 UNIT) CAPS capsule Take 1 capsule (50,000 Units total) by mouth every 7 (seven) days. 4 capsule 4   No facility-administered medications prior to visit.    Allergies  Allergen Reactions   Iodine Rash   Sulfa Antibiotics Rash    ROS See HPI    Objective:    Physical Exam   Physical Exam Constitutional:      General: She is not in acute distress.    Appearance: Normal appearance. She is well-developed. She is not toxic-appearing.  HENT:     Head: Normocephalic and atraumatic.     Nose: Nose normal. No drainage.  Eyes:     General:        Right eye: No discharge.        Left eye: No discharge.     Conjunctiva/sclera: Conjunctivae normal.  Neck:     Thyroid : No thyromegaly.  Cardiovascular:     Rate and Rhythm: Normal rate and regular rhythm.     Heart sounds: Normal heart sounds. No murmur heard. Pulmonary:     Effort: Pulmonary effort is normal. No respiratory distress.     Breath sounds: Normal breath sounds. CTAB.  Abdominal:     Bowel sounds are normal, Abdomen soft. No abdominal tenderness.  Musculoskeletal:        General: Normal range of motion.     Cervical back: Neck supple.  Lymphadenopathy:     Cervical: No cervical adenopathy.  Skin:    General: Skin is warm and dry.  Neurological:     Mental Status: She is alert and oriented to person, place, and time.  Psychiatric:        Mood and Affect: Mood normal.        Behavior: Behavior normal.        Thought Content: Thought content normal.        Judgment: Judgment  normal.   There were no vitals taken for this visit. Wt Readings from Last 3 Encounters:  10/30/23 126 lb 3.2 oz (57.2 kg)  10/12/23 128 lb 6.4 oz (58.2 kg)  10/04/23 126 lb (57.2 kg)       Assessment & Plan:   Problem List Items Addressed This Visit     Hyperglycemia - Primary   hgba1c acceptable, minimize simple carbs. Increase exercise as tolerated.       Hyperlipidemia, mixed   Encourage heart healthy diet such as MIND or DASH diet, increase exercise, avoid trans fats, simple carbohydrates and processed foods, consider a krill or fish or flaxseed oil cap daily. Tolerating Rosuvastatin         Hypertension   Well controlled, no changes to meds. Encouraged heart healthy diet such as the DASH diet and exercise as tolerated.        Osteoporosis   Encourage adequate exercise, calcium  and vitamin d  intake. She has been working with physical therapy for strength training and fall prevention.Continue weight-bearing exercises like walking.      General Health Maintenance -Advise FU with GYN due to tamoxifen use and risk of endometrial hypertrophy. -Schedule follow-up appointments for 6 and 12 months.       I am having Yolanda RAMAN. Bullard maintain her Probiotic Product (PROBIOTIC PO), Multiple Vitamins-Minerals (PRESERVISION AREDS 2+MULTI VIT PO), tretinoin , Tymlos, hydrochlorothiazide, triamcinolone  cream, Vitamin D  (Ergocalciferol ), tamoxifen, and rosuvastatin .  No orders of the defined types were placed in this encounter.

## 2024-04-22 NOTE — Assessment & Plan Note (Signed)
 Encourage heart healthy diet such as MIND or DASH diet, increase exercise, avoid trans fats, simple carbohydrates and processed foods, consider a krill or fish or flaxseed oil cap daily. Tolerating Rosuvastatin

## 2024-04-23 ENCOUNTER — Encounter: Payer: Self-pay | Admitting: *Deleted

## 2024-04-23 ENCOUNTER — Ambulatory Visit (INDEPENDENT_AMBULATORY_CARE_PROVIDER_SITE_OTHER): Admitting: Student

## 2024-04-23 ENCOUNTER — Ambulatory Visit: Payer: Self-pay | Admitting: Student

## 2024-04-23 ENCOUNTER — Encounter: Payer: Self-pay | Admitting: Family Medicine

## 2024-04-23 ENCOUNTER — Encounter: Payer: Self-pay | Admitting: Student

## 2024-04-23 VITALS — BP 142/70 | HR 61 | Temp 98.0°F | Resp 12 | Ht 62.0 in | Wt 127.0 lb

## 2024-04-23 DIAGNOSIS — I10 Essential (primary) hypertension: Secondary | ICD-10-CM

## 2024-04-23 DIAGNOSIS — R739 Hyperglycemia, unspecified: Secondary | ICD-10-CM

## 2024-04-23 DIAGNOSIS — E782 Mixed hyperlipidemia: Secondary | ICD-10-CM

## 2024-04-23 DIAGNOSIS — M810 Age-related osteoporosis without current pathological fracture: Secondary | ICD-10-CM | POA: Diagnosis not present

## 2024-04-23 LAB — CBC WITH DIFFERENTIAL/PLATELET
Basophils Absolute: 0 K/uL (ref 0.0–0.1)
Basophils Relative: 0.4 % (ref 0.0–3.0)
Eosinophils Absolute: 0 K/uL (ref 0.0–0.7)
Eosinophils Relative: 0.7 % (ref 0.0–5.0)
HCT: 42.4 % (ref 36.0–46.0)
Hemoglobin: 14.4 g/dL (ref 12.0–15.0)
Lymphocytes Relative: 39.1 % (ref 12.0–46.0)
Lymphs Abs: 1.8 K/uL (ref 0.7–4.0)
MCHC: 33.9 g/dL (ref 30.0–36.0)
MCV: 93 fl (ref 78.0–100.0)
Monocytes Absolute: 0.4 K/uL (ref 0.1–1.0)
Monocytes Relative: 9.2 % (ref 3.0–12.0)
Neutro Abs: 2.4 K/uL (ref 1.4–7.7)
Neutrophils Relative %: 50.6 % (ref 43.0–77.0)
Platelets: 304 K/uL (ref 150.0–400.0)
RBC: 4.56 Mil/uL (ref 3.87–5.11)
RDW: 12.3 % (ref 11.5–15.5)
WBC: 4.7 K/uL (ref 4.0–10.5)

## 2024-04-23 LAB — COMPREHENSIVE METABOLIC PANEL WITH GFR
ALT: 9 U/L (ref 0–35)
AST: 14 U/L (ref 0–37)
Albumin: 4.2 g/dL (ref 3.5–5.2)
Alkaline Phosphatase: 50 U/L (ref 39–117)
BUN: 17 mg/dL (ref 6–23)
CO2: 33 meq/L — ABNORMAL HIGH (ref 19–32)
Calcium: 9.2 mg/dL (ref 8.4–10.5)
Chloride: 103 meq/L (ref 96–112)
Creatinine, Ser: 0.76 mg/dL (ref 0.40–1.20)
GFR: 79.9 mL/min (ref >60.00)
Glucose, Bld: 98 mg/dL (ref 70–99)
Potassium: 3.9 meq/L (ref 3.5–5.1)
Sodium: 141 meq/L (ref 135–145)
Total Bilirubin: 0.6 mg/dL (ref 0.2–1.2)
Total Protein: 6.1 g/dL (ref 6.0–8.3)

## 2024-04-23 LAB — LIPID PANEL
Cholesterol: 141 mg/dL (ref 0–200)
HDL: 56.1 mg/dL (ref >39.00)
LDL Cholesterol: 66 mg/dL (ref 0–99)
NonHDL: 85.08
Total CHOL/HDL Ratio: 3
Triglycerides: 96 mg/dL (ref 0.0–149.0)
VLDL: 19.2 mg/dL (ref 0.0–40.0)

## 2024-04-23 LAB — VITAMIN D 25 HYDROXY (VIT D DEFICIENCY, FRACTURES): VITD: 58.17 ng/mL (ref 30.00–100.00)

## 2024-04-23 LAB — HEMOGLOBIN A1C: Hgb A1c MFr Bld: 5.9 % (ref 4.6–6.5)

## 2024-04-23 LAB — TSH: TSH: 4.82 u[IU]/mL (ref 0.35–5.50)

## 2024-05-01 ENCOUNTER — Ambulatory Visit: Payer: Medicare Other | Admitting: Family Medicine

## 2024-05-07 LAB — HM MAMMOGRAPHY

## 2024-06-01 ENCOUNTER — Other Ambulatory Visit: Payer: Self-pay | Admitting: Cardiology

## 2024-06-12 ENCOUNTER — Encounter: Payer: Self-pay | Admitting: Cardiology

## 2024-06-12 ENCOUNTER — Ambulatory Visit: Attending: Cardiology | Admitting: Cardiology

## 2024-06-12 VITALS — BP 134/70 | HR 85 | Ht 62.0 in | Wt 126.1 lb

## 2024-06-12 DIAGNOSIS — I351 Nonrheumatic aortic (valve) insufficiency: Secondary | ICD-10-CM

## 2024-06-12 DIAGNOSIS — I10 Essential (primary) hypertension: Secondary | ICD-10-CM | POA: Diagnosis not present

## 2024-06-12 DIAGNOSIS — I7121 Aneurysm of the ascending aorta, without rupture: Secondary | ICD-10-CM | POA: Diagnosis not present

## 2024-06-12 DIAGNOSIS — R072 Precordial pain: Secondary | ICD-10-CM

## 2024-06-12 DIAGNOSIS — R0789 Other chest pain: Secondary | ICD-10-CM | POA: Insufficient documentation

## 2024-06-12 DIAGNOSIS — E782 Mixed hyperlipidemia: Secondary | ICD-10-CM

## 2024-06-12 DIAGNOSIS — R0609 Other forms of dyspnea: Secondary | ICD-10-CM

## 2024-06-12 MED ORDER — METOPROLOL TARTRATE 100 MG PO TABS: ORAL_TABLET | ORAL | 0 refills | Status: DC

## 2024-06-12 MED ORDER — PREDNISONE 50 MG PO TABS: ORAL_TABLET | ORAL | 0 refills | Status: DC

## 2024-06-12 NOTE — H&P (View-Only) (Signed)
 Cardiology Office Note:    Date:  06/12/2024   ID:  Yolanda Hernandez, DOB [birth year], MRN [id number]  PCP:  Domenica Harlene LABOR, MD  Cardiologist:  Lamar Fitch, MD    Referring MD: Domenica Harlene LABOR, MD   No chief complaint on file.   History of Present Illness:    Yolanda Hernandez is a 68 y.o. female past medical history significant for moderate aortic insufficiency, normal left ventricular ejection fraction normal LV size, ascending aortic aneurysm measuring 41 mm, essential hypertension, dyslipidemia, breast cancer.  Comes today to my office for follow-up.  Overall doing well.  She described however 1 episode of atypical chest pain not related to exercise lasting just for few seconds to minute.  Otherwise she is doing well.  She was concerned maybe about her blood pressure but blood pressure seems to be reasonable.  Past Medical History:  Diagnosis Date   Allergy    Sulfa drugs, iodine   Cerumen impaction    chronic   Cervical cancer screening 03/12/2015   Menarche at 10 Regular and moderate flow  history of abnormal pap in past, bx and cryo once many years ago, normal since. Last 2011 and normal G0P0, s/p No history of abnormal MGM Last in 2015, no breast concerns  No concerns today Biopsy, cervical: gyn surgeries Menopause at 53 ish   Conflict between patient and family 03/26/2021   Dysmenorrhea    Heart murmur 03/26/2021   Hematuria 07/31/2017   HPV in female 2019   Hyperglycemia 03/19/2013   Hypertension    Muscle cramping 03/26/2021   Osteopenia    Osteoporosis 04/11/2010   Osteoporosis, compression fracture in 1/24   Other and unspecified hyperlipidemia 03/19/2013   Preventative health care 09/08/2011   Right calf pain 03/22/2021   Skin cancer    Skin cancer 07/14/2016   Uterine fibroid     Past Surgical History:  Procedure Laterality Date   BREAST SURGERY     biopsy left   COLPOSCOPY     GYNECOLOGIC CRYOSURGERY     MYRINGOPLASTY  1972   TONSILLECTOMY       Current Medications: Current Meds  Medication Sig   Abaloparatide (TYMLOS) 3120 MCG/1.56ML SOPN Inject 80 mcg into the skin daily.   hydrochlorothiazide (HYDRODIURIL) 12.5 MG tablet Take 12.5 mg by mouth daily.   Multiple Vitamins-Minerals (PRESERVISION AREDS 2+MULTI VIT PO) Take 1 tablet by mouth in the morning and at bedtime.   Probiotic Product (PROBIOTIC PO) Take 1 tablet by mouth daily.   rosuvastatin  (CRESTOR ) 10 MG tablet Take 1 tablet (10 mg total) by mouth daily. Please keep scheduled appointment for future refills. Thank you.   tamoxifen (NOLVADEX) 20 MG tablet Take 20 mg by mouth daily.   tretinoin  (RETIN-A ) 0.05 % cream APPLY TOPICALLY AT BEDTIME   Vitamin D , Ergocalciferol , (DRISDOL ) 1.25 MG (50000 UNIT) CAPS capsule Take 1 capsule (50,000 Units total) by mouth every 7 (seven) days. (Patient taking differently: Take 50,000 Units by mouth every 14 (fourteen) days.)     Allergies:   Iodine and Sulfa antibiotics   Social History   Socioeconomic History   Marital status: Married    Spouse name: Not on file   Number of children: Not on file   Years of education: Not on file   Highest education level: Master's degree (e.g., MA, MS, MEng, MEd, MSW, MBA)  Occupational History   Not on file  Tobacco Use   Smoking status: Never   Smokeless tobacco: Never  Vaping Use   Vaping status: Never Used  Substance and Sexual Activity   Alcohol use: Yes    Comment: Less than 1 glass of wine or beer per week   Drug use: Never    Comment: older than 15, less than 5   Sexual activity: Yes    Birth control/protection: Post-menopausal  Other Topics Concern   Not on file  Social History Narrative   Not on file   Social Drivers of Health   Financial Resource Strain: Low Risk  (04/22/2024)   Overall Financial Resource Strain (CARDIA)    Difficulty of Paying Living Expenses: Not hard at all  Food Insecurity: No Food Insecurity (04/22/2024)   Hunger Vital Sign    Worried About  Running Out of Food in the Last Year: Never true    Ran Out of Food in the Last Year: Never true  Transportation Needs: No Transportation Needs (04/22/2024)   PRAPARE - Administrator, Civil Service (Medical): No    Lack of Transportation (Non-Medical): No  Physical Activity: Sufficiently Active (04/22/2024)   Exercise Vital Sign    Days of Exercise per Week: 4 days    Minutes of Exercise per Session: 50 min  Stress: No Stress Concern Present (04/22/2024)   [employer] of Occupational Health - Occupational Stress Questionnaire    Feeling of Stress: Only a little  Social Connections: Socially Integrated (04/22/2024)   Social Connection and Isolation Panel    Frequency of Communication with Friends and Family: Twice a week    Frequency of Social Gatherings with Friends and Family: Twice a week    Attends Religious Services: More than 4 times per year    Active Member of [employer] or Organizations: Yes    Attends Engineer, structural: More than 4 times per year    Marital Status: Married     Family History: The patient's family history includes Alcohol abuse in her father and another family member; Arthritis in her maternal grandmother and mother; Asthma in her father; Colon cancer in her maternal uncle; Diabetes in her father and sister; Hearing loss in her mother; Heart disease in her father, paternal grandfather, paternal uncle, and sister; Hyperlipidemia in her father, mother, and sister; Hypertension in her father, mother, and sister; Kidney disease in an other family member; Macular degeneration in her mother; Osteoporosis in her mother and sister; Vision loss in her maternal grandmother and mother. There is no history of Esophageal cancer, Stomach cancer, or Rectal cancer. ROS:   Please see the history of present illness.    All 14 point review of systems negative except as described per history of present illness  EKGs/Labs/Other Studies Reviewed:    EKG  Interpretation Date/Time:  Thursday June 12 2024 09:33:02 EDT Ventricular Rate:  85 PR Interval:  170 QRS Duration:  122 QT Interval:  390 QTC Calculation: 464 R Axis:   29  Text Interpretation: Normal sinus rhythm Right bundle branch block T wave abnormality, consider inferior ischemia When compared with ECG of 11-Jun-2023 14:14, Criteria for Septal infarct are no longer Present Confirmed by Bernie Charleston [phone]) on 06/12/2024 9:42:18 AM    Recent Labs: 10/30/2023: Magnesium 1.9 04/23/2024: ALT 9; BUN 17; Creatinine, Ser 0.76; Hemoglobin 14.4; Platelets 304.0; Potassium 3.9; Sodium 141; TSH 4.82  Recent Lipid Panel    Component Value Date/Time   CHOL 141 04/23/2024 0858   CHOL 156 06/25/2023 0922   TRIG 96.0 04/23/2024 0858   HDL 56.10 04/23/2024 0858  HDL 53 06/25/2023 0922   CHOLHDL 3 04/23/2024 0858   VLDL 19.2 04/23/2024 0858   LDLCALC 66 04/23/2024 0858   LDLCALC 82 06/25/2023 0922    Physical Exam:    VS:  BP 134/70   Pulse 85   Ht 5' 2 (1.575 m)   Wt 126 lb 1.3 oz (57.2 kg)   SpO2 99%   BMI 23.06 kg/m     Wt Readings from Last 3 Encounters:  06/12/24 126 lb 1.3 oz (57.2 kg)  04/23/24 127 lb (57.6 kg)  10/30/23 126 lb 3.2 oz (57.2 kg)     GEN:  Well nourished, well developed in no acute distress HEENT: Normal NECK: No JVD; No carotid bruits LYMPHATICS: No lymphadenopathy CARDIAC: RRR, holosystolic murmur best heard on the left border Mr. Otho auscultable, no rubs, no gallops RESPIRATORY:  Clear to auscultation without rales, wheezing or rhonchi  ABDOMEN: Soft, non-tender, non-distended MUSCULOSKELETAL:  No edema; No deformity  SKIN: Warm and dry LOWER EXTREMITIES: no swelling NEUROLOGIC:  Alert and oriented x 3 PSYCHIATRIC:  Normal affect   ASSESSMENT:    1. Primary hypertension   2. Atypical chest pain   3. Aneurysm of ascending aorta without rupture (HCC)   4. Nonrheumatic aortic valve insufficiency   5. Hyperlipidemia, mixed    PLAN:     In order of problems listed above:  Aortic insufficiency, was scheduled to have another echocardiogram to reassess the lesion.  Will look at the left ventricle size and function. Ascending aortic aneurysm.  Will reassess this with echocardiogram, however, she complained of having some chest pain after admit somewhat atypical but I think there is value of doing coronary CT angio which is scheduled her to have.  That also allowed me to look at the ascending aorta. Essential hypertension blood pressure still decently controlled. Dyslipidemia she is taking Crestor  10 which I continue I did review K PN which show me data from 04/23/2024 with LDL 66 HDL 56 we will continue present management   Medication Adjustments/Labs and Tests Ordered: Current medicines are reviewed at length with the patient today.  Concerns regarding medicines are outlined above.  Orders Placed This Encounter  Procedures   EKG 12-Lead   Medication changes: No orders of the defined types were placed in this encounter.   Signed, Lamar DOROTHA Fitch, MD, [facility] 06/12/2024 10:01 AM    [facility]

## 2024-06-12 NOTE — Addendum Note (Signed)
 Addended by: ARLOA PLANAS D on: 06/12/2024 10:22 AM   Modules accepted: Orders

## 2024-06-12 NOTE — Patient Instructions (Addendum)
 Medication Instructions:   TAKE: Metoprolol  100mg  1 tablet 2 hours prior to CT Scan   Lab Work: None Ordered If you have labs (blood work) drawn today and your tests are completely normal, you will receive your results only by: MyChart Message (if you have MyChart) OR A paper copy in the mail If you have any lab test that is abnormal or we need to change your treatment, we will call you to review the results.   Testing/Procedures:  Your physician has requested that you have an echocardiogram. Echocardiography is a painless test that uses sound waves to create images of your heart. It provides your doctor with information about the size and shape of your heart and how well your heart's chambers and valves are working. This procedure takes approximately one hour. There are no restrictions for this procedure. Please do NOT wear cologne, perfume, aftershave, or lotions (deodorant is allowed). Please arrive 15 minutes prior to your appointment time.  Please note: We ask at that you not bring children with you during ultrasound (echo/ vascular) testing. Due to room size and safety concerns, children are not allowed in the ultrasound rooms during exams. Our front office staff cannot provide observation of children in our lobby area while testing is being conducted. An adult accompanying a patient to their appointment will only be allowed in the ultrasound room at the discretion of the ultrasound technician under special circumstances. We apologize for any inconvenience.   Your cardiac CT will be scheduled at one of the below locations:   [facility] [address] [place], [place] [address]  Please follow these instructions carefully (unless otherwise directed):     On the Night Before the Test: Be sure to Drink plenty of water. Do not consume any caffeinated/decaffeinated beverages or chocolate 12 hours prior to your test. Do not take any antihistamines 12 hours prior to your  test. If the patient has contrast allergy: Patient will need a prescription for Prednisone  and very clear instructions (as follows): Prednisone  50 mg - take 13 hours prior to test Take another Prednisone  50 mg 7 hours prior to test Take another Prednisone  50 mg 1 hour prior to test Take Benadryl 50 mg 1 hour prior to test Patient must complete all four doses of above prophylactic medications. Patient will need a ride after test due to Benadryl.  On the Day of the Test: Drink plenty of water until 1 hour prior to the test. Do not eat any food 4 hours prior to the test. You may take your regular medications prior to the test.  Take metoprolol  (Lopressor ) two hours prior to test. HOLD Furosemide/Hydrochlorothiazide morning of the test. FEMALES- please wear underwire-free bra if available, avoid dresses & tight clothing        After the Test: Drink plenty of water. After receiving IV contrast, you may experience a mild flushed feeling. This is normal. On occasion, you may experience a mild rash up to 24 hours after the test. This is not dangerous. If this occurs, you can take Benadryl 25 mg and increase your fluid intake. If you experience trouble breathing, this can be serious. If it is severe call 911 IMMEDIATELY. If it is mild, please call our office. If you take any of these medications: Glipizide/Metformin, Avandament, Glucavance, please do not take 48 hours after completing test unless otherwise instructed.  We will call to schedule your test 2-4 weeks out understanding that some insurance companies will need an authorization prior  to the service being performed.   For non-scheduling related questions, please contact the cardiac imaging nurse navigator should you have any questions/concerns: Camie Shutter, Cardiac Imaging Nurse Navigator Chantal Requena, Cardiac Imaging Nurse Navigator [facility] Direct Office Dial: [phone]   For scheduling needs,  including cancellations and rescheduling, please call [place], [phone].    Follow-Up: At [facility], you and your health needs are our priority.  As part of our continuing mission to provide you with exceptional heart care, we have created designated Provider Care Teams.  These Care Teams include your primary Cardiologist (physician) and Advanced Practice Providers (APPs -  Physician Assistants and Nurse Practitioners) who all work together to provide you with the care you need, when you need it.  We recommend signing up for the patient portal called MyChart.  Sign up information is provided on this After Visit Summary.  MyChart is used to connect with patients for Virtual Visits (Telemedicine).  Patients are able to view lab/test results, encounter notes, upcoming appointments, etc.  Non-urgent messages can be sent to your provider as well.   To learn more about what you can do with MyChart, go to [email or web address].    Your next appointment:   6 month(s)  The format for your next appointment:   In Person  Provider:   Lamar Fitch, MD    Other Instructions NA

## 2024-06-12 NOTE — Progress Notes (Signed)
 Cardiology Office Note:    Date:  06/12/2024   ID:  KLEO DUNGEE, DOB [birth year], MRN [id number]  PCP:  Domenica Harlene LABOR, MD  Cardiologist:  Lamar Fitch, MD    Referring MD: Domenica Harlene LABOR, MD   No chief complaint on file.   History of Present Illness:    Yolanda Hernandez is a 68 y.o. female past medical history significant for moderate aortic insufficiency, normal left ventricular ejection fraction normal LV size, ascending aortic aneurysm measuring 41 mm, essential hypertension, dyslipidemia, breast cancer.  Comes today to my office for follow-up.  Overall doing well.  She described however 1 episode of atypical chest pain not related to exercise lasting just for few seconds to minute.  Otherwise she is doing well.  She was concerned maybe about her blood pressure but blood pressure seems to be reasonable.  Past Medical History:  Diagnosis Date   Allergy    Sulfa drugs, iodine   Cerumen impaction    chronic   Cervical cancer screening 03/12/2015   Menarche at 10 Regular and moderate flow  history of abnormal pap in past, bx and cryo once many years ago, normal since. Last 2011 and normal G0P0, s/p No history of abnormal MGM Last in 2015, no breast concerns  No concerns today Biopsy, cervical: gyn surgeries Menopause at 53 ish   Conflict between patient and family 03/26/2021   Dysmenorrhea    Heart murmur 03/26/2021   Hematuria 07/31/2017   HPV in female 2019   Hyperglycemia 03/19/2013   Hypertension    Muscle cramping 03/26/2021   Osteopenia    Osteoporosis 04/11/2010   Osteoporosis, compression fracture in 1/24   Other and unspecified hyperlipidemia 03/19/2013   Preventative health care 09/08/2011   Right calf pain 03/22/2021   Skin cancer    Skin cancer 07/14/2016   Uterine fibroid     Past Surgical History:  Procedure Laterality Date   BREAST SURGERY     biopsy left   COLPOSCOPY     GYNECOLOGIC CRYOSURGERY     MYRINGOPLASTY  1972   TONSILLECTOMY       Current Medications: Current Meds  Medication Sig   Abaloparatide (TYMLOS) 3120 MCG/1.56ML SOPN Inject 80 mcg into the skin daily.   hydrochlorothiazide (HYDRODIURIL) 12.5 MG tablet Take 12.5 mg by mouth daily.   Multiple Vitamins-Minerals (PRESERVISION AREDS 2+MULTI VIT PO) Take 1 tablet by mouth in the morning and at bedtime.   Probiotic Product (PROBIOTIC PO) Take 1 tablet by mouth daily.   rosuvastatin  (CRESTOR ) 10 MG tablet Take 1 tablet (10 mg total) by mouth daily. Please keep scheduled appointment for future refills. Thank you.   tamoxifen (NOLVADEX) 20 MG tablet Take 20 mg by mouth daily.   tretinoin  (RETIN-A ) 0.05 % cream APPLY TOPICALLY AT BEDTIME   Vitamin D , Ergocalciferol , (DRISDOL ) 1.25 MG (50000 UNIT) CAPS capsule Take 1 capsule (50,000 Units total) by mouth every 7 (seven) days. (Patient taking differently: Take 50,000 Units by mouth every 14 (fourteen) days.)     Allergies:   Iodine and Sulfa antibiotics   Social History   Socioeconomic History   Marital status: Married    Spouse name: Not on file   Number of children: Not on file   Years of education: Not on file   Highest education level: Master's degree (e.g., MA, MS, MEng, MEd, MSW, MBA)  Occupational History   Not on file  Tobacco Use   Smoking status: Never   Smokeless tobacco: Never  Vaping Use   Vaping status: Never Used  Substance and Sexual Activity   Alcohol use: Yes    Comment: Less than 1 glass of wine or beer per week   Drug use: Never    Comment: older than 16, less than 5   Sexual activity: Yes    Birth control/protection: Post-menopausal  Other Topics Concern   Not on file  Social History Narrative   Not on file   Social Drivers of Health   Financial Resource Strain: Low Risk  (04/22/2024)   Overall Financial Resource Strain (CARDIA)    Difficulty of Paying Living Expenses: Not hard at all  Food Insecurity: No Food Insecurity (04/22/2024)   Hunger Vital Sign    Worried About  Running Out of Food in the Last Year: Never true    Ran Out of Food in the Last Year: Never true  Transportation Needs: No Transportation Needs (04/22/2024)   PRAPARE - Administrator, Civil Service (Medical): No    Lack of Transportation (Non-Medical): No  Physical Activity: Sufficiently Active (04/22/2024)   Exercise Vital Sign    Days of Exercise per Week: 4 days    Minutes of Exercise per Session: 50 min  Stress: No Stress Concern Present (04/22/2024)   [employer] of Occupational Health - Occupational Stress Questionnaire    Feeling of Stress: Only a little  Social Connections: Socially Integrated (04/22/2024)   Social Connection and Isolation Panel    Frequency of Communication with Friends and Family: Twice a week    Frequency of Social Gatherings with Friends and Family: Twice a week    Attends Religious Services: More than 4 times per year    Active Member of [employer] or Organizations: Yes    Attends Engineer, structural: More than 4 times per year    Marital Status: Married     Family History: The patient's family history includes Alcohol abuse in her father and another family member; Arthritis in her maternal grandmother and mother; Asthma in her father; Colon cancer in her maternal uncle; Diabetes in her father and sister; Hearing loss in her mother; Heart disease in her father, paternal grandfather, paternal uncle, and sister; Hyperlipidemia in her father, mother, and sister; Hypertension in her father, mother, and sister; Kidney disease in an other family member; Macular degeneration in her mother; Osteoporosis in her mother and sister; Vision loss in her maternal grandmother and mother. There is no history of Esophageal cancer, Stomach cancer, or Rectal cancer. ROS:   Please see the history of present illness.    All 14 point review of systems negative except as described per history of present illness  EKGs/Labs/Other Studies Reviewed:    EKG  Interpretation Date/Time:  Thursday June 12 2024 09:33:02 EDT Ventricular Rate:  85 PR Interval:  170 QRS Duration:  122 QT Interval:  390 QTC Calculation: 464 R Axis:   29  Text Interpretation: Normal sinus rhythm Right bundle branch block T wave abnormality, consider inferior ischemia When compared with ECG of 11-Jun-2023 14:14, Criteria for Septal infarct are no longer Present Confirmed by Bernie Charleston [phone]) on 06/12/2024 9:42:18 AM    Recent Labs: 10/30/2023: Magnesium 1.9 04/23/2024: ALT 9; BUN 17; Creatinine, Ser 0.76; Hemoglobin 14.4; Platelets 304.0; Potassium 3.9; Sodium 141; TSH 4.82  Recent Lipid Panel    Component Value Date/Time   CHOL 141 04/23/2024 0858   CHOL 156 06/25/2023 0922   TRIG 96.0 04/23/2024 0858   HDL 56.10 04/23/2024 0858  HDL 53 06/25/2023 0922   CHOLHDL 3 04/23/2024 0858   VLDL 19.2 04/23/2024 0858   LDLCALC 66 04/23/2024 0858   LDLCALC 82 06/25/2023 0922    Physical Exam:    VS:  BP 134/70   Pulse 85   Ht 5' 2 (1.575 m)   Wt 126 lb 1.3 oz (57.2 kg)   SpO2 99%   BMI 23.06 kg/m     Wt Readings from Last 3 Encounters:  06/12/24 126 lb 1.3 oz (57.2 kg)  04/23/24 127 lb (57.6 kg)  10/30/23 126 lb 3.2 oz (57.2 kg)     GEN:  Well nourished, well developed in no acute distress HEENT: Normal NECK: No JVD; No carotid bruits LYMPHATICS: No lymphadenopathy CARDIAC: RRR, holosystolic murmur best heard on the left border Mr. Otho auscultable, no rubs, no gallops RESPIRATORY:  Clear to auscultation without rales, wheezing or rhonchi  ABDOMEN: Soft, non-tender, non-distended MUSCULOSKELETAL:  No edema; No deformity  SKIN: Warm and dry LOWER EXTREMITIES: no swelling NEUROLOGIC:  Alert and oriented x 3 PSYCHIATRIC:  Normal affect   ASSESSMENT:    1. Primary hypertension   2. Atypical chest pain   3. Aneurysm of ascending aorta without rupture (HCC)   4. Nonrheumatic aortic valve insufficiency   5. Hyperlipidemia, mixed    PLAN:     In order of problems listed above:  Aortic insufficiency, was scheduled to have another echocardiogram to reassess the lesion.  Will look at the left ventricle size and function. Ascending aortic aneurysm.  Will reassess this with echocardiogram, however, she complained of having some chest pain after admit somewhat atypical but I think there is value of doing coronary CT angio which is scheduled her to have.  That also allowed me to look at the ascending aorta. Essential hypertension blood pressure still decently controlled. Dyslipidemia she is taking Crestor  10 which I continue I did review K PN which show me data from 04/23/2024 with LDL 66 HDL 56 we will continue present management   Medication Adjustments/Labs and Tests Ordered: Current medicines are reviewed at length with the patient today.  Concerns regarding medicines are outlined above.  Orders Placed This Encounter  Procedures   EKG 12-Lead   Medication changes: No orders of the defined types were placed in this encounter.   Signed, Lamar DOROTHA Fitch, MD, [facility] 06/12/2024 10:01 AM    [facility]

## 2024-06-17 ENCOUNTER — Encounter: Payer: Self-pay | Admitting: Obstetrics and Gynecology

## 2024-06-17 ENCOUNTER — Ambulatory Visit (INDEPENDENT_AMBULATORY_CARE_PROVIDER_SITE_OTHER): Admitting: Obstetrics and Gynecology

## 2024-06-17 VITALS — BP 156/62 | HR 97 | Temp 98.5°F | Ht 62.5 in | Wt 125.0 lb

## 2024-06-17 DIAGNOSIS — R87810 Cervical high risk human papillomavirus (HPV) DNA test positive: Secondary | ICD-10-CM | POA: Diagnosis not present

## 2024-06-17 DIAGNOSIS — M810 Age-related osteoporosis without current pathological fracture: Secondary | ICD-10-CM

## 2024-06-17 DIAGNOSIS — Z124 Encounter for screening for malignant neoplasm of cervix: Secondary | ICD-10-CM | POA: Diagnosis not present

## 2024-06-17 DIAGNOSIS — C50912 Malignant neoplasm of unspecified site of left female breast: Secondary | ICD-10-CM

## 2024-06-17 DIAGNOSIS — Z1331 Encounter for screening for depression: Secondary | ICD-10-CM

## 2024-06-17 DIAGNOSIS — Z01419 Encounter for gynecological examination (general) (routine) without abnormal findings: Secondary | ICD-10-CM | POA: Insufficient documentation

## 2024-06-17 DIAGNOSIS — Z9289 Personal history of other medical treatment: Secondary | ICD-10-CM

## 2024-06-17 NOTE — Patient Instructions (Signed)
 For patients under 50-70yo, I recommend 1200mg  calcium  daily and 600IU of vitamin D daily. For patients over 70yo, I recommend 1200mg  calcium  daily and 800IU of vitamin D daily.  Health Maintenance, Female Adopting a healthy lifestyle and getting preventive care are important in promoting health and wellness. Ask your health care provider about: The right schedule for you to have regular tests and exams. Things you can do on your own to prevent diseases and keep yourself healthy. What should I know about diet, weight, and exercise? Eat a healthy diet  Eat a diet that includes plenty of vegetables, fruits, low-fat dairy products, and lean protein. Do not eat a lot of foods that are high in solid fats, added sugars, or sodium. Maintain a healthy weight Body mass index (BMI) is used to identify weight problems. It estimates body fat based on height and weight. Your health care provider can help determine your BMI and help you achieve or maintain a healthy weight. Get regular exercise Get regular exercise. This is one of the most important things you can do for your health. Most adults should: Exercise for at least 150 minutes each week. The exercise should increase your heart rate and make you sweat (moderate-intensity exercise). Do strengthening exercises at least twice a week. This is in addition to the moderate-intensity exercise. Spend less time sitting. Even light physical activity can be beneficial. Watch cholesterol and blood lipids Have your blood tested for lipids and cholesterol at 69 years of age, then have this test every 5 years. Have your cholesterol levels checked more often if: Your lipid or cholesterol levels are high. You are older than 69 years of age. You are at high risk for heart disease. What should I know about cancer screening? Depending on your health history and family history, you may need to have cancer screening at various ages. This may include screening  for: Breast cancer. Cervical cancer. Colorectal cancer. Skin cancer. Lung cancer. What should I know about heart disease, diabetes, and high blood pressure? Blood pressure and heart disease High blood pressure causes heart disease and increases the risk of stroke. This is more likely to develop in people who have high blood pressure readings or are overweight. Have your blood pressure checked: Every 3-5 years if you are 25-57 years of age. Every year if you are 24 years old or older. Diabetes Have regular diabetes screenings. This checks your fasting blood sugar level. Have the screening done: Once every three years after age 62 if you are at a normal weight and have a low risk for diabetes. More often and at a younger age if you are overweight or have a high risk for diabetes. What should I know about preventing infection? Hepatitis B If you have a higher risk for hepatitis B, you should be screened for this virus. Talk with your health care provider to find out if you are at risk for hepatitis B infection. Hepatitis C Testing is recommended for: Everyone born from [birth year] through 1966. Anyone with known risk factors for hepatitis C. Sexually transmitted infections (STIs) Get screened for STIs, including gonorrhea and chlamydia, if: You are sexually active and are younger than 69 years of age. You are older than 69 years of age and your health care provider tells you that you are at risk for this type of infection. Your sexual activity has changed since you were last screened, and you are at increased risk for chlamydia or gonorrhea. Ask your health care provider if  you are at risk. Ask your health care provider about whether you are at high risk for HIV. Your health care provider may recommend a prescription medicine to help prevent HIV infection. If you choose to take medicine to prevent HIV, you should first get tested for HIV. You should then be tested every 3 months for as long as you  are taking the medicine. Osteoporosis and menopause Osteoporosis is a disease in which the bones lose minerals and strength with aging. This can result in bone fractures. If you are 72 years old or older, or if you are at risk for osteoporosis and fractures, ask your health care provider if you should: Be screened for bone loss. Take a calcium  or vitamin D supplement to lower your risk of fractures. Be given hormone replacement therapy (HRT) to treat symptoms of menopause. Follow these instructions at home: Alcohol use Do not drink alcohol if: Your health care provider tells you not to drink. You are pregnant, may be pregnant, or are planning to become pregnant. If you drink alcohol: Limit how much you have to: 0-1 drink a day. Know how much alcohol is in your drink. In the [place] one drink equals one 12 oz bottle of beer (355 mL), one 5 oz glass of wine (148 mL), or one 1 oz glass of hard liquor (44 mL). Lifestyle Do not use any products that contain nicotine or tobacco. These products include cigarettes, chewing tobacco, and vaping devices, such as e-cigarettes. If you need help quitting, ask your health care provider. Do not use street drugs. Do not share needles. Ask your health care provider for help if you need support or information about quitting drugs. General instructions Schedule regular health, dental, and eye exams. Stay current with your vaccines. Tell your health care provider if: You often feel depressed. You have ever been abused or do not feel safe at home. Summary Adopting a healthy lifestyle and getting preventive care are important in promoting health and wellness. Follow your health care provider's instructions about healthy diet, exercising, and getting tested or screened for diseases. Follow your health care provider's instructions on monitoring your cholesterol and blood pressure. This information is not intended to replace advice given to you by your health  care provider. Make sure you discuss any questions you have with your health care provider. Document Revised: 02/21/2021 Document Reviewed: 02/21/2021 Elsevier Patient Education  2024 [employer].

## 2024-06-17 NOTE — Progress Notes (Signed)
 68 y.o. G0P0000 postmenopausal female with osteoporosis and history of vertebral fracture, left breast cancer (dx 2024, lumpectomy and RT, on tamoxifen) here for annual exam- high risk medicare. Married 59yr.  She reports some urinary urgency when at home at times. Mild sx. Concerns about tamoxifen and uterine cancer.  Concerns with doing PAP smear more frequently due to hx of hpv (2019) and neck cancer with husband. Should she refrain from sex.  Desires small speculum. Urine sample provided: No  Postmenopausal bleeding: none Pelvic discharge or pain: none Breast mass, nipple discharge or skin changes : none Sexually active: no   History of abnormal PAP: 2019 +HPV Last PAP:     Component Value Date/Time   DIAGPAP  12/26/2022 1402    - Negative for intraepithelial lesion or malignancy (NILM)   DIAGPAP  09/30/2020 0839    - Negative for intraepithelial lesion or malignancy (NILM)   DIAGPAP  09/30/2019 0908    - Negative for intraepithelial lesion or malignancy (NILM)   HPVHIGH Negative 09/30/2020 0839   HPVHIGH Negative 09/30/2019 0908   ADEQPAP  12/26/2022 1402    Satisfactory for evaluation. The presence or absence of an   ADEQPAP  12/26/2022 1402    endocervical/transformation zone component cannot be determined because   ADEQPAP of atrophy. 12/26/2022 1402   Last mammogram: 05/07/24 BI-RADS category 2 Last DXA: 05/16/23 Will repeat in Nov 2025 Last colonoscopy: 07/27/22 10 yr recall  Exercising: Yes, aquatic therapy, walking, resistance training 4-5 days a week Smoker:No  Flowsheet Row Office Visit from 06/17/2024 in [facility]  PHQ-2 Total Score 0    Flowsheet Row Office Visit from 08/22/2018 in [facility]  PHQ-9 Total Score 1     GYN HISTORY: + HPV, 2019  OB History  Gravida Para Term Preterm AB Living  0 0 0 0 0 0  SAB IAB Ectopic Multiple Live Births  0 0 0 0 0   Past Medical  History:  Diagnosis Date   Allergy    Sulfa drugs, iodine   Cerumen impaction    chronic   Cervical cancer screening 03/12/2015   Menarche at 10 Regular and moderate flow  history of abnormal pap in past, bx and cryo once many years ago, normal since. Last 2011 and normal G0P0, s/p No history of abnormal MGM Last in 2015, no breast concerns  No concerns today Biopsy, cervical: gyn surgeries Menopause at 53 ish   Conflict between patient and family 03/26/2021   Dysmenorrhea    Heart murmur 03/26/2021   Hematuria 07/31/2017   HPV in female 2019   Hyperglycemia 03/19/2013   Hypertension    Muscle cramping 03/26/2021   Osteopenia    Osteoporosis 04/11/2010   Osteoporosis, compression fracture in 1/24   Other and unspecified hyperlipidemia 03/19/2013   Preventative health care 09/08/2011   Right calf pain 03/22/2021   Skin cancer    Skin cancer 07/14/2016   Uterine fibroid    Past Surgical History:  Procedure Laterality Date   BREAST SURGERY     biopsy left   COLPOSCOPY     GYNECOLOGIC CRYOSURGERY     MYRINGOPLASTY  1972   TONSILLECTOMY     Current Outpatient Medications on File Prior to Visit  Medication Sig Dispense Refill   Abaloparatide (TYMLOS) 3120 MCG/1.56ML SOPN Inject 80 mcg into the skin daily.     hydrochlorothiazide (HYDRODIURIL) 12.5 MG tablet Take 12.5 mg by mouth  daily.     metoprolol  tartrate (LOPRESSOR ) 100 MG tablet Take one tablet 2 hours before cardiac CT for heart greater than 55 1 tablet 0   Multiple Vitamins-Minerals (PRESERVISION AREDS 2+MULTI VIT PO) Take 1 tablet by mouth in the morning and at bedtime.     predniSONE  (DELTASONE ) 50 MG tablet 1. Prednisone  50 mg - take 13 hours prior to test 2. Take another Prednisone  50 mg 7 hours prior to test 3. Take another Prednisone  50 mg 1 hour prior to test 4. Take Benadryl 50 mg 1 hour prior to test 3 tablet 0   Probiotic Product (PROBIOTIC PO) Take 1 tablet by mouth daily.     rosuvastatin  (CRESTOR ) 10 MG  tablet Take 1 tablet (10 mg total) by mouth daily. Please keep scheduled appointment for future refills. Thank you. 30 tablet 0   tamoxifen (NOLVADEX) 20 MG tablet Take 20 mg by mouth daily.     tretinoin  (RETIN-A ) 0.05 % cream APPLY TOPICALLY AT BEDTIME 45 g 5   Vitamin D , Ergocalciferol , (DRISDOL ) 1.25 MG (50000 UNIT) CAPS capsule Take 1 capsule (50,000 Units total) by mouth every 7 (seven) days. (Patient taking differently: Take 50,000 Units by mouth every 14 (fourteen) days.) 4 capsule 4   No current facility-administered medications on file prior to visit.   Social History   Socioeconomic History   Marital status: Married    Spouse name: Not on file   Number of children: Not on file   Years of education: Not on file   Highest education level: Master's degree (e.g., MA, MS, MEng, MEd, MSW, MBA)  Occupational History   Not on file  Tobacco Use   Smoking status: Never   Smokeless tobacco: Never  Vaping Use   Vaping status: Never Used  Substance and Sexual Activity   Alcohol use: Yes    Comment: Less than 1 glass of wine or beer per week   Drug use: Never    Comment: older than 15, less than 5   Sexual activity: Yes    Birth control/protection: Post-menopausal  Other Topics Concern   Not on file  Social History Narrative   Not on file   Social Drivers of Health   Financial Resource Strain: Low Risk  (04/22/2024)   Overall Financial Resource Strain (CARDIA)    Difficulty of Paying Living Expenses: Not hard at all  Food Insecurity: No Food Insecurity (04/22/2024)   Hunger Vital Sign    Worried About Running Out of Food in the Last Year: Never true    Ran Out of Food in the Last Year: Never true  Transportation Needs: No Transportation Needs (04/22/2024)   PRAPARE - Administrator, Civil Service (Medical): No    Lack of Transportation (Non-Medical): No  Physical Activity: Sufficiently Active (04/22/2024)   Exercise Vital Sign    Days of Exercise per Week: 4 days     Minutes of Exercise per Session: 50 min  Stress: No Stress Concern Present (04/22/2024)   [employer] of Occupational Health - Occupational Stress Questionnaire    Feeling of Stress: Only a little  Social Connections: Socially Integrated (04/22/2024)   Social Connection and Isolation Panel    Frequency of Communication with Friends and Family: Twice a week    Frequency of Social Gatherings with Friends and Family: Twice a week    Attends Religious Services: More than 4 times per year    Active Member of Clubs or Organizations: Yes    Attends  Club or Organization Meetings: More than 4 times per year    Marital Status: Married  Catering manager Violence: Unknown (12/04/2022)   Received from [facility]   HITS    Physically Hurt: Not on file    Insult or Talk Down To: Not on file    Threaten Physical Harm: Not on file    Scream or Curse: Not on file   Family History  Problem Relation Age of Onset   Osteoporosis Mother    Hyperlipidemia Mother    Hypertension Mother    Macular degeneration Mother    Arthritis Mother    Hearing loss Mother    Vision loss Mother    Heart disease Father    Diabetes Father    Hyperlipidemia Father    Hypertension Father    Asthma Father    Alcohol abuse Father    Diabetes Sister    Heart disease Sister        s/p cardiac stent   Hyperlipidemia Sister    Hypertension Sister    Osteoporosis Sister    Colon cancer Maternal Uncle    Heart disease Paternal Uncle    Arthritis Maternal Grandmother    Vision loss Maternal Grandmother    Heart disease Paternal Grandfather    Kidney disease Other    Alcohol abuse Other    Esophageal cancer Neg Hx    Stomach cancer Neg Hx    Rectal cancer Neg Hx    Allergies  Allergen Reactions   Iodine Rash   Sulfa Antibiotics Rash      PE Today's Vitals   06/17/24 1544 06/17/24 1548  BP: (!) 162/64 (!) 156/62  Pulse: 97   Temp: 98.5 F (36.9 C)   SpO2: 98%   Weight: 125 lb (56.7 kg)   Height: 5'  2.5 (1.588 m)    Body mass index is 22.5 kg/m.  Physical Exam Vitals reviewed. Exam conducted with a chaperone present.  Constitutional:      General: She is not in acute distress.    Appearance: Normal appearance.  HENT:     Head: Normocephalic and atraumatic.     Nose: Nose normal.  Eyes:     Extraocular Movements: Extraocular movements intact.     Conjunctiva/sclera: Conjunctivae normal.  Neck:     Thyroid : No thyroid  mass, thyromegaly or thyroid  tenderness.  Pulmonary:     Effort: Pulmonary effort is normal.  Chest:     Chest wall: No mass or tenderness.  Breasts:    Right: Normal. No swelling, mass, nipple discharge, skin change or tenderness.     Left: Normal. No swelling, mass, nipple discharge, skin change or tenderness.       Comments: Left lumpectomy and SLND scar Abdominal:     General: There is no distension.     Palpations: Abdomen is soft.     Tenderness: There is no abdominal tenderness.  Genitourinary:    General: Normal vulva.     Exam position: Lithotomy position.     Urethra: No prolapse.     Vagina: Normal. No vaginal discharge or bleeding.     Cervix: Normal. No lesion.     Uterus: Normal. Not enlarged and not tender.      Adnexa: Right adnexa normal and left adnexa normal.  Musculoskeletal:        General: Normal range of motion.     Cervical back: Normal range of motion.  Lymphadenopathy:     Upper Body:     Right  upper body: No axillary adenopathy.     Left upper body: No axillary adenopathy.     Lower Body: No right inguinal adenopathy. No left inguinal adenopathy.  Skin:    General: Skin is warm and dry.  Neurological:     General: No focal deficit present.     Mental Status: She is alert.  Psychiatric:        Mood and Affect: Mood normal.        Behavior: Behavior normal.       Assessment and Plan:        Encounter for breast and pelvic examination Assessment & Plan: Cervical cancer screening performed according to ASCCP  guidelines. Due to HPV testing next year. No indication to avoid intercourse with husband over almost 30 years. Encouraged annual mammogram screening, followed by oncology Colonoscopy UTD DXA managed by endocrine, patient is holding off on imaging at this time Labs and immunizations with her primary Encouraged safe sexual practices as indicated Encouraged healthy lifestyle practices with diet and exercise For patients under 50-70yo, I recommend 1200mg  calcium  daily and 600IU of vitamin D  daily.    Malignant neoplasm of left female breast, unspecified estrogen receptor status, unspecified site of breast (HCC)  Discussed risk of uterine cancer with tamoxifene is less than 1%, however we did discuss that the risk for uterine precancer and polyps is higher. No indication to stop therapy at this time  Vera LULLA Pa, MD

## 2024-06-17 NOTE — Assessment & Plan Note (Signed)
 Cervical cancer screening performed according to ASCCP guidelines. Due to HPV testing next year. No indication to avoid intercourse with husband over almost 30 years. Encouraged annual mammogram screening, followed by oncology Colonoscopy UTD DXA managed by endocrine, patient is holding off on imaging at this time Labs and immunizations with her primary Encouraged safe sexual practices as indicated Encouraged healthy lifestyle practices with diet and exercise For patients under 50-70yo, I recommend 1200mg  calcium  daily and 600IU of vitamin D  daily.

## 2024-06-19 ENCOUNTER — Encounter (HOSPITAL_COMMUNITY): Payer: Self-pay

## 2024-06-24 ENCOUNTER — Ambulatory Visit (HOSPITAL_BASED_OUTPATIENT_CLINIC_OR_DEPARTMENT_OTHER)
Admission: RE | Admit: 2024-06-24 | Discharge: 2024-06-24 | Disposition: A | Discharge status: Home / Self Care | Source: Ambulatory Visit | Attending: Cardiology | Admitting: Cardiology

## 2024-06-24 ENCOUNTER — Encounter (HOSPITAL_BASED_OUTPATIENT_CLINIC_OR_DEPARTMENT_OTHER): Payer: Self-pay

## 2024-06-24 DIAGNOSIS — R072 Precordial pain: Secondary | ICD-10-CM | POA: Insufficient documentation

## 2024-06-24 MED ORDER — NITROGLYCERIN 0.4 MG SL SUBL: 0.8000 mg | SUBLINGUAL_TABLET | Freq: Once | SUBLINGUAL | Status: AC

## 2024-06-24 MED ORDER — METOPROLOL TARTRATE 5 MG/5ML IV SOLN
INTRAVENOUS | Status: AC
  Filled 2024-06-24: qty 10

## 2024-06-24 MED ORDER — IOHEXOL 350 MG/ML SOLN
100.0000 mL | Freq: Once | INTRAVENOUS | Status: AC | PRN
  Administered 2024-06-24: 95 mL via INTRAVENOUS

## 2024-06-24 MED ORDER — NITROGLYCERIN 0.4 MG SL SUBL
SUBLINGUAL_TABLET | SUBLINGUAL | Status: AC
Start: 2024-06-24 — End: 2024-06-24
  Administered 2024-06-24: 0.8 mg via SUBLINGUAL
  Filled 2024-06-24: qty 10

## 2024-06-25 ENCOUNTER — Ambulatory Visit (HOSPITAL_BASED_OUTPATIENT_CLINIC_OR_DEPARTMENT_OTHER)
Admission: RE | Admit: 2024-06-25 | Discharge: 2024-06-25 | Disposition: A | Discharge status: Home / Self Care | Source: Ambulatory Visit | Attending: Cardiology | Admitting: Cardiology

## 2024-06-25 ENCOUNTER — Other Ambulatory Visit: Payer: Self-pay | Admitting: Cardiology

## 2024-06-25 DIAGNOSIS — R931 Abnormal findings on diagnostic imaging of heart and coronary circulation: Secondary | ICD-10-CM | POA: Insufficient documentation

## 2024-06-25 NOTE — Progress Notes (Signed)
 FFR Order

## 2024-06-26 ENCOUNTER — Encounter: Payer: Self-pay | Admitting: Cardiology

## 2024-06-27 ENCOUNTER — Telehealth: Payer: Self-pay | Admitting: Cardiology

## 2024-06-27 ENCOUNTER — Telehealth: Payer: Self-pay

## 2024-06-27 DIAGNOSIS — I10 Essential (primary) hypertension: Secondary | ICD-10-CM

## 2024-06-27 NOTE — Telephone Encounter (Signed)
 Medication Instructions:   HOLD: hydrochlorothiazide - day of procedure     *If you need a refill on your cardiac medications before your next appointment, please call your pharmacy*   Lab Work: [facility], CBC- Today Friday Sept 12, 2025  If you have labs (blood work) drawn today and your tests are completely normal, you will receive your results only by: MyChart Message (if you have MyChart) OR A paper copy in the mail If you have any lab test that is abnormal or we need to change your treatment, we will call you to review the results.   Testing/Procedures:  [facility] DEPT OF [facility] AT [address] [place] [address] Dept: [phone] Loc: [phone]  Yolanda Hernandez  06/27/2024  You are scheduled for a Cardiac Catheterization on Monday, September 15 with Dr. Lonni End.  1. Please arrive at the [facility] (Main Entrance A) at [facility]: [address] [place], [place] [address] at 7:00 AM (This time is 2 hour(s) before your procedure to ensure your preparation).   Free valet parking service is available. You will check in at ADMITTING. The support person will be asked to wait in the waiting room.  It is OK to have someone drop you off and come back when you are ready to be discharged.    Special note: Every effort is made to have your procedure done on time. Please understand that emergencies sometimes delay scheduled procedures.  2. Diet: Nothing to eat after midnight.   3. Hydration: You need to be well hydrated before your procedure. On September 15, you may drink approved liquids (see below) until 2 hours before the procedure, with 16 oz of water as your last intake.   List of approved liquids water, clear juice, clear tea, black coffee, fruit juices, non-citric and without pulp, carbonated beverages, Gatorade, Kool -Aid, plain Jello-O and plain ice popsicles.  4. Labs: You  will need to have blood drawn on Friday, September 12 at [facility]: [address], [place]. You do not need to be fasting.  5. Medication instructions in preparation for your procedure:   Contrast Allergy: No      Stop taking, HTCZ (Hydrochlorothiazide) Monday, September 15,      On the morning of your procedure, take your Aspirin 81 mg and any morning medicines NOT listed above.  You may use sips of water.  6. Plan to go home the same day, you will only stay overnight if medically necessary. 7. Bring a current list of your medications and current insurance cards. 8. You MUST have a responsible person to drive you home. 9. Someone MUST be with you the first 24 hours after you arrive home or your discharge will be delayed. 10. Please wear clothes that are easy to get on and off and wear slip-on shoes.  Thank you for allowing us  to care for you!   -- [facility]    Follow-Up: At [facility], you and your health needs are our priority.  As part of our continuing mission to provide you with exceptional heart care, we have created designated Provider Care Teams.  These Care Teams include your primary Cardiologist (physician) and Advanced Practice Providers (APPs -  Physician Assistants and Nurse Practitioners) who all work together to provide you with the care you need, when you need it.  We recommend signing up for the  patient portal called MyChart.  Sign up information is provided on this After Visit Summary.  MyChart is used to connect with patients for Virtual Visits (Telemedicine).  Patients are able to view lab/test results, encounter notes, upcoming appointments, etc.  Non-urgent messages can be sent to your provider as well.   To learn more about what you can do with MyChart, go to [email or web address].    Your next appointment:   2 month(s)  The format for your next appointment:   In Person  Provider:    Lamar Fitch, MD   Other Instructions  Coronary Angiogram With Stent Coronary angiogram with stent placement is a procedure to widen or open a narrow blood vessel of the heart (coronary artery). Arteries may become blocked by cholesterol buildup (plaques) in the lining of the artery wall. When a coronary artery becomes partially blocked, blood flow to that area decreases. This may lead to chest pain or a heart attack (myocardial infarction). A stent is a small piece of metal that looks like mesh or spring. Stent placement may be done as treatment after a heart attack, or to prevent a heart attack if a blocked artery is found by a coronary angiogram. Let your health care provider know about: Any allergies you have, including allergies to medicines or contrast dye. All medicines you are taking, including vitamins, herbs, eye drops, creams, and over-the-counter medicines. Any problems you or family members have had with anesthetic medicines. Any blood disorders you have. Any surgeries you have had. Any medical conditions you have, including kidney problems or kidney failure. Whether you are pregnant or may be pregnant. Whether you are breastfeeding. What are the risks? Generally, this is a safe procedure. However, serious problems may occur, including: Damage to nearby structures or organs, such as the heart, blood vessels, or kidneys. A return of blockage. Bleeding, infection, or bruising at the insertion site. A collection of blood under the skin (hematoma) at the insertion site. A blood clot in another part of the body. Allergic reaction to medicines or dyes. Bleeding into the abdomen (retroperitoneal bleeding). Stroke (rare). Heart attack (rare). What happens before the procedure? Staying hydrated Follow instructions from your health care provider about hydration, which may include: Up to 2 hours before the procedure - you may continue to drink clear liquids, such as water,  clear fruit juice, black coffee, and plain tea.    Eating and drinking restrictions Follow instructions from your health care provider about eating and drinking, which may include: 8 hours before the procedure - stop eating heavy meals or foods, such as meat, fried foods, or fatty foods. 6 hours before the procedure - stop eating light meals or foods, such as toast or cereal. 2 hours before the procedure - stop drinking clear liquids. Medicines Ask your health care provider about: Changing or stopping your regular medicines. This is especially important if you are taking diabetes medicines or blood thinners. Taking medicines such as aspirin and ibuprofen. These medicines can thin your blood. Do not take these medicines unless your health care provider tells you to take them. Generally, aspirin is recommended before a thin tube, called a catheter, is passed through a blood vessel and inserted into the heart (cardiac catheterization). Taking over-the-counter medicines, vitamins, herbs, and supplements. General instructions Do not use any products that contain nicotine or tobacco for at least 4 weeks before the procedure. These products include cigarettes, e-cigarettes, and chewing tobacco. If you need help quitting, ask your health care provider. Plan  to have someone take you home from the hospital or clinic. If you will be going home right after the procedure, plan to have someone with you for 24 hours. You may have tests and imaging procedures. Ask your health care provider: How your insertion site will be marked. Ask which artery will be used for the procedure. What steps will be taken to help prevent infection. These may include: Removing hair at the insertion site. Washing skin with a germ-killing soap. Taking antibiotic medicine. What happens during the procedure? An IV will be inserted into one of your veins. Electrodes may be placed on your chest to monitor your heart rate during the  procedure. You will be given one or more of the following: A medicine to help you relax (sedative). A medicine to numb the area (local anesthetic) for catheter insertion. A small incision will be made for catheter insertion. The catheter will be inserted into an artery using a guide wire. The location may be in your groin, your wrist, or the fold of your arm (near your elbow). An X-ray procedure (fluoroscopy) will be used to help guide the catheter to the opening of the heart arteries. A dye will be injected into the catheter. X-rays will be taken. The dye helps to show where any narrowing or blockages are located in the arteries. Tell your health care provider if you have chest pain or trouble breathing. A tiny wire will be guided to the blocked spot, and a balloon will be inflated to make the artery wider. The stent will be expanded to crush the plaques into the wall of the vessel. The stent will hold the area open and improve the blood flow. Most stents have a drug coating to reduce the risk of the stent narrowing over time. The artery may be made wider using a drill, laser, or other tools that remove plaques. The catheter will be removed when the blood flow improves. The stent will stay where it was placed, and the lining of the artery will grow over it. A bandage (dressing) will be placed on the insertion site. Pressure will be applied to stop bleeding. The IV will be removed. This procedure may vary among health care providers and hospitals.    What happens after the procedure? Your blood pressure, heart rate, breathing rate, and blood oxygen level will be monitored until you leave the hospital or clinic. If the procedure is done through the leg, you will lie flat in bed for a few hours or for as long as told by your health care provider. You will be instructed not to bend or cross your legs. The insertion site and the pulse in your foot or wrist will be checked often. You may have more  blood tests, X-rays, and a test that records the electrical activity of your heart (electrocardiogram, or ECG). Do not drive for 24 hours if you were given a sedative during your procedure. Summary Coronary angiogram with stent placement is a procedure to widen or open a narrowed coronary artery. This is done to treat heart problems. Before the procedure, let your health care provider know about all the medical conditions and surgeries you have or have had. This is a safe procedure. However, some problems may occur, including damage to nearby structures or organs, bleeding, blood clots, or allergies. Follow your health care provider's instructions about eating, drinking, medicines, and other lifestyle changes, such as quitting tobacco use before the procedure. This information is not intended to replace advice  given to you by your health care provider. Make sure you discuss any questions you have with your health care provider. Document Revised: 04/23/2019 Document Reviewed: 04/23/2019 Elsevier Patient Education  2021 [employer].

## 2024-06-27 NOTE — Telephone Encounter (Signed)
 I called Yolanda Hernandez today to inform her about results of her coronary CT angio.  Coronary CT angio showed possibility of significant left main coronary artery.  Her FFR was negative but narrowing of the left main appears to be significant.  We did talk about options for the situation option being cardiac catheterization versus medical therapy.  We elected to proceed with cardiac catheterization.  I discussed the procedure to her including all risk benefits as well as alternatives.  She agreed to proceed.

## 2024-06-28 LAB — BASIC METABOLIC PANEL WITH GFR
BUN/Creatinine Ratio: 19 (ref 12–28)
BUN: 15 mg/dL (ref 8–27)
CO2: 25 mmol/L (ref 20–29)
Calcium: 9.2 mg/dL (ref 8.7–10.3)
Chloride: 102 mmol/L (ref 96–106)
Creatinine, Ser: 0.77 mg/dL (ref 0.57–1.00)
Glucose: 92 mg/dL (ref 70–99)
Potassium: 4.2 mmol/L (ref 3.5–5.2)
Sodium: 143 mmol/L (ref 134–144)
eGFR: 83 mL/min/1.73 (ref >59)

## 2024-06-28 LAB — CBC
Hematocrit: 42.7 % (ref 34.0–46.6)
Hemoglobin: 14.4 g/dL (ref 11.1–15.9)
MCH: 31.9 pg (ref 26.6–33.0)
MCHC: 33.7 g/dL (ref 31.5–35.7)
MCV: 95 fL (ref 79–97)
Platelets: 330 x10E3/uL (ref 150–450)
RBC: 4.52 x10E6/uL (ref 3.77–5.28)
RDW: 11.5 % — ABNORMAL LOW (ref 11.7–15.4)
WBC: 5.4 x10E3/uL (ref 3.4–10.8)

## 2024-06-30 ENCOUNTER — Ambulatory Visit (HOSPITAL_COMMUNITY)
Admission: RE | Admit: 2024-06-30 | Discharge: 2024-06-30 | Disposition: A | Discharge status: Home / Self Care | Attending: Internal Medicine | Admitting: Internal Medicine

## 2024-06-30 ENCOUNTER — Encounter (HOSPITAL_COMMUNITY)
Admission: RE | Disposition: A | Discharge status: Home / Self Care | Payer: Self-pay | Source: Home / Self Care | Attending: Internal Medicine

## 2024-06-30 ENCOUNTER — Other Ambulatory Visit: Payer: Self-pay

## 2024-06-30 ENCOUNTER — Ambulatory Visit: Payer: Self-pay | Admitting: Cardiology

## 2024-06-30 ENCOUNTER — Encounter (HOSPITAL_COMMUNITY): Payer: Self-pay | Admitting: Internal Medicine

## 2024-06-30 DIAGNOSIS — I351 Nonrheumatic aortic (valve) insufficiency: Secondary | ICD-10-CM | POA: Diagnosis not present

## 2024-06-30 DIAGNOSIS — E782 Mixed hyperlipidemia: Secondary | ICD-10-CM | POA: Diagnosis not present

## 2024-06-30 DIAGNOSIS — Z853 Personal history of malignant neoplasm of breast: Secondary | ICD-10-CM | POA: Insufficient documentation

## 2024-06-30 DIAGNOSIS — I2510 Atherosclerotic heart disease of native coronary artery without angina pectoris: Secondary | ICD-10-CM | POA: Diagnosis not present

## 2024-06-30 DIAGNOSIS — R931 Abnormal findings on diagnostic imaging of heart and coronary circulation: Secondary | ICD-10-CM | POA: Insufficient documentation

## 2024-06-30 DIAGNOSIS — R079 Chest pain, unspecified: Secondary | ICD-10-CM | POA: Diagnosis present

## 2024-06-30 DIAGNOSIS — I10 Essential (primary) hypertension: Secondary | ICD-10-CM | POA: Diagnosis not present

## 2024-06-30 DIAGNOSIS — I7121 Aneurysm of the ascending aorta, without rupture: Secondary | ICD-10-CM | POA: Insufficient documentation

## 2024-06-30 HISTORY — PX: CORONARY PRESSURE/FFR STUDY: CATH118243

## 2024-06-30 HISTORY — PX: LEFT HEART CATH AND CORONARY ANGIOGRAPHY: CATH118249

## 2024-06-30 LAB — POCT ACTIVATED CLOTTING TIME
Activated Clotting Time: 251 s
Activated Clotting Time: 256 s

## 2024-06-30 SURGERY — LEFT HEART CATH AND CORONARY ANGIOGRAPHY
Anesthesia: LOCAL

## 2024-06-30 MED ORDER — NITROGLYCERIN 0.4 MG SL SUBL
0.4000 mg | SUBLINGUAL_TABLET | SUBLINGUAL | 99 refills | Status: AC | PRN
Start: 2024-06-30 — End: 2025-06-30

## 2024-06-30 MED ORDER — LIDOCAINE HCL (PF) 1 % IJ SOLN
INTRAMUSCULAR | Status: DC | PRN
  Administered 2024-06-30: 2 mL via INTRADERMAL

## 2024-06-30 MED ORDER — ACETAMINOPHEN 325 MG PO TABS: 650.0000 mg | ORAL_TABLET | ORAL | Status: DC | PRN

## 2024-06-30 MED ORDER — NITROGLYCERIN 1 MG/10 ML FOR IR/CATH LAB
INTRA_ARTERIAL | Status: DC | PRN
  Administered 2024-06-30: 100 ug via INTRA_ARTERIAL
  Administered 2024-06-30: 100 ug via INTRACORONARY

## 2024-06-30 MED ORDER — LABETALOL HCL 5 MG/ML IV SOLN: 10.0000 mg | INTRAVENOUS | Status: DC | PRN

## 2024-06-30 MED ORDER — ONDANSETRON HCL 4 MG/2ML IJ SOLN: 4.0000 mg | Freq: Four times a day (QID) | INTRAMUSCULAR | Status: DC | PRN

## 2024-06-30 MED ORDER — LIDOCAINE HCL (PF) 1 % IJ SOLN
INTRAMUSCULAR | Status: AC
  Filled 2024-06-30: qty 30

## 2024-06-30 MED ORDER — NITROGLYCERIN 1 MG/10 ML FOR IR/CATH LAB
INTRA_ARTERIAL | Status: AC
  Filled 2024-06-30: qty 10

## 2024-06-30 MED ORDER — FREE WATER
500.0000 mL | Freq: Once | Status: AC
Start: 2024-06-30 — End: 2024-06-30
  Administered 2024-06-30: 500 mL via ORAL

## 2024-06-30 MED ORDER — ASPIRIN 81 MG PO CHEW: 81.0000 mg | CHEWABLE_TABLET | ORAL | Status: DC

## 2024-06-30 MED ORDER — SODIUM CHLORIDE 0.9 % IV SOLN: 250.0000 mL | INTRAVENOUS | Status: DC | PRN

## 2024-06-30 MED ORDER — SODIUM CHLORIDE 0.9% FLUSH: 3.0000 mL | INTRAVENOUS | Status: DC | PRN

## 2024-06-30 MED ORDER — FENTANYL CITRATE (PF) 100 MCG/2ML IJ SOLN
INTRAMUSCULAR | Status: AC
  Filled 2024-06-30: qty 2

## 2024-06-30 MED ORDER — HYDRALAZINE HCL 20 MG/ML IJ SOLN: 10.0000 mg | INTRAMUSCULAR | Status: DC | PRN

## 2024-06-30 MED ORDER — HEPARIN SODIUM (PORCINE) 1000 UNIT/ML IJ SOLN
INTRAMUSCULAR | Status: AC
  Filled 2024-06-30: qty 10

## 2024-06-30 MED ORDER — SODIUM CHLORIDE 0.9% FLUSH: 3.0000 mL | Freq: Two times a day (BID) | INTRAVENOUS | Status: DC

## 2024-06-30 MED ORDER — VERAPAMIL HCL 2.5 MG/ML IV SOLN
INTRAVENOUS | Status: AC
  Filled 2024-06-30: qty 2

## 2024-06-30 MED ORDER — FREE WATER
500.0000 mL | Freq: Once | Status: AC
  Administered 2024-06-30: 500 mL via ORAL

## 2024-06-30 MED ORDER — VERAPAMIL HCL 2.5 MG/ML IV SOLN
INTRAVENOUS | Status: DC | PRN
  Administered 2024-06-30: 10 mL via INTRA_ARTERIAL

## 2024-06-30 MED ORDER — HEPARIN SODIUM (PORCINE) 1000 UNIT/ML IJ SOLN
INTRAMUSCULAR | Status: DC | PRN
  Administered 2024-06-30: 2000 [IU] via INTRAVENOUS
  Administered 2024-06-30 (×2): 3000 [IU] via INTRAVENOUS

## 2024-06-30 MED ORDER — FENTANYL CITRATE (PF) 100 MCG/2ML IJ SOLN
INTRAMUSCULAR | Status: DC | PRN
  Administered 2024-06-30 (×2): 25 ug via INTRAVENOUS

## 2024-06-30 MED ORDER — METOPROLOL SUCCINATE ER 25 MG PO TB24: 25.0000 mg | ORAL_TABLET | Freq: Every day | ORAL | 11 refills | Status: AC

## 2024-06-30 MED ORDER — MIDAZOLAM HCL 2 MG/2ML IJ SOLN
INTRAMUSCULAR | Status: AC
  Filled 2024-06-30: qty 2

## 2024-06-30 MED ORDER — HEPARIN (PORCINE) IN NACL 1000-0.9 UT/500ML-% IV SOLN
INTRAVENOUS | Status: DC | PRN
  Administered 2024-06-30: 1000 mL

## 2024-06-30 MED ORDER — IOHEXOL 350 MG/ML SOLN
INTRAVENOUS | Status: DC | PRN
  Administered 2024-06-30: 120 mL

## 2024-06-30 MED ORDER — MIDAZOLAM HCL 2 MG/2ML IJ SOLN
INTRAMUSCULAR | Status: DC | PRN
  Administered 2024-06-30 (×2): 1 mg via INTRAVENOUS

## 2024-06-30 SURGICAL SUPPLY — 11 items
CATH 5FR JL3.5 JR4 ANG PIG MP (CATHETERS) IMPLANT
CATH VISTA GUIDE 6FR XBLD 3.5 (CATHETERS) IMPLANT
DEVICE RAD TR BAND REGULAR (VASCULAR PRODUCTS) IMPLANT
GLIDESHEATH SLEND SS 6F .021 (SHEATH) IMPLANT
GUIDEWIRE INQWIRE 1.5J.035X260 (WIRE) IMPLANT
GUIDEWIRE PRESSURE X 175 (WIRE) IMPLANT
KIT ESSENTIALS PG (KITS) IMPLANT
KIT SYRINGE INJ CVI SPIKEX1 (MISCELLANEOUS) IMPLANT
PACK CARDIAC CATHETERIZATION (CUSTOM PROCEDURE TRAY) ×1 IMPLANT
SET ATX-X65L (MISCELLANEOUS) IMPLANT
SHEATH PROBE COVER 6X72 (BAG) IMPLANT

## 2024-06-30 NOTE — Interval H&P Note (Signed)
 History and Physical Interval Note:  06/30/2024 7:38 AM  Yolanda Hernandez  has presented today for surgery, with the diagnosis of abnormal cardiac CTA and atypical chest pain.  The various methods of treatment have been discussed with the patient and family. After consideration of risks, benefits and other options for treatment, the patient has consented to  Procedure(s): LEFT HEART CATH AND CORONARY ANGIOGRAPHY (N/A) as a surgical intervention.  The patient's history has been reviewed, patient examined, no change in status, stable for surgery.  I have reviewed the patient's chart and labs.  Questions were answered to the patient's satisfaction.  Cath Lab Visit (complete for each Cath Lab visit)  Clinical Evaluation Leading to the Procedure:   ACS: No.  Non-ACS:    Anginal Classification: CCS II  Anti-ischemic medical therapy: No Therapy  Non-Invasive Test Results: Questionable kink versus stenosis in LMCA -> indeterminate risk  Prior CABG: No previous CABG  Dariel Betzer

## 2024-06-30 NOTE — Brief Op Note (Signed)
 BRIEF CARDIAC CATHETERIZATION NOTE  06/30/2024  9:11 AM  PATIENT:  Yolanda Hernandez  68 y.o. female  PRE-OPERATIVE DIAGNOSIS:  Abnormal cardiac CTA  POST-OPERATIVE DIAGNOSIS:  Same  PROCEDURE:  Procedure(s): LEFT HEART CATH AND CORONARY ANGIOGRAPHY (N/A) CORONARY PRESSURE/FFR STUDY (N/A)  SURGEON:  Surgeons and Role:    * Naeem Quillin, Lonni, MD - Primary  FINDINGS: Multivessel coronary artery disease, including kink of mid LMCA with ~20% associated stenosis (RFR = 0.98), 80% proximal/mid stenosis (RFR = 0.91), and 40-50% distal RCA stenosis. Normal LVEF and LVEDP.  RECOMMENDATIONS: Continue medical therapy; will add metoprolol  succinate 25 mg daily. Aggressive secondary prevention of CAD.  Lonni Hanson, MD [facility]

## 2024-07-01 ENCOUNTER — Encounter: Payer: Self-pay | Admitting: Cardiology

## 2024-07-02 ENCOUNTER — Other Ambulatory Visit: Payer: Self-pay | Admitting: Cardiology

## 2024-07-09 ENCOUNTER — Ambulatory Visit (HOSPITAL_BASED_OUTPATIENT_CLINIC_OR_DEPARTMENT_OTHER)
Admission: RE | Admit: 2024-07-09 | Discharge: 2024-07-09 | Disposition: A | Discharge status: Home / Self Care | Source: Ambulatory Visit | Attending: Cardiology | Admitting: Cardiology

## 2024-07-09 DIAGNOSIS — R0609 Other forms of dyspnea: Secondary | ICD-10-CM | POA: Insufficient documentation

## 2024-07-09 LAB — ECHOCARDIOGRAM COMPLETE
AR max vel: 2.4 cm2
AV Area VTI: 2.35 cm2
AV Area mean vel: 2.25 cm2
AV Mean grad: 6 mmHg
AV Peak grad: 12.4 mmHg
AV Vena cont: 0.6 cm
Ao pk vel: 1.76 m/s
Area-P 1/2: 2.87 cm2
Calc EF: 74 %
MV M vel: 5.05 m/s
MV Peak grad: 102 mmHg
S' Lateral: 2.9 cm
Single Plane A2C EF: 76 %
Single Plane A4C EF: 74.2 %

## 2024-07-11 ENCOUNTER — Ambulatory Visit: Payer: Self-pay | Admitting: Cardiology

## 2024-07-14 ENCOUNTER — Telehealth: Payer: Self-pay

## 2024-07-14 NOTE — Telephone Encounter (Signed)
 Left message on My Chart with Echo results per Dr. Karry note. Routed to PCP.

## 2024-07-24 ENCOUNTER — Other Ambulatory Visit: Payer: Self-pay | Admitting: Family Medicine

## 2024-07-24 DIAGNOSIS — R7989 Other specified abnormal findings of blood chemistry: Secondary | ICD-10-CM

## 2024-08-18 ENCOUNTER — Encounter: Payer: Self-pay | Admitting: Radiology

## 2024-09-15 ENCOUNTER — Encounter: Payer: Self-pay | Admitting: *Deleted

## 2024-09-15 ENCOUNTER — Encounter: Payer: Self-pay | Admitting: Cardiology

## 2024-09-15 ENCOUNTER — Ambulatory Visit: Attending: Cardiology | Admitting: Cardiology

## 2024-09-15 VITALS — BP 140/60 | HR 81 | Ht 62.5 in | Wt 127.0 lb

## 2024-09-15 DIAGNOSIS — I7121 Aneurysm of the ascending aorta, without rupture: Secondary | ICD-10-CM | POA: Diagnosis not present

## 2024-09-15 DIAGNOSIS — E782 Mixed hyperlipidemia: Secondary | ICD-10-CM

## 2024-09-15 DIAGNOSIS — R82994 Hypercalciuria: Secondary | ICD-10-CM | POA: Insufficient documentation

## 2024-09-15 DIAGNOSIS — I2510 Atherosclerotic heart disease of native coronary artery without angina pectoris: Secondary | ICD-10-CM | POA: Insufficient documentation

## 2024-09-15 DIAGNOSIS — E559 Vitamin D deficiency, unspecified: Secondary | ICD-10-CM | POA: Insufficient documentation

## 2024-09-15 DIAGNOSIS — Z853 Personal history of malignant neoplasm of breast: Secondary | ICD-10-CM | POA: Insufficient documentation

## 2024-09-15 MED ORDER — ROSUVASTATIN CALCIUM 20 MG PO TABS: 20.0000 mg | ORAL_TABLET | Freq: Every day | ORAL | 3 refills | Status: AC

## 2024-09-15 NOTE — Progress Notes (Unsigned)
 Cardiology Office Note:    Date:  09/15/2024   ID:  Yolanda Hernandez, DOB 29-Nov-1955, MRN [id number]  PCP:  Domenica Harlene LABOR, MD  Cardiologist:  Lamar Fitch, MD    Referring MD: Domenica Harlene LABOR, MD   Chief Complaint  Patient presents with   Follow-up    History of Present Illness:    Yolanda Hernandez is a 68 y.o. female past medical history significant for moderate aortic insufficiency, normal left ventricular ejection fraction normal left ventricular size, ascending aortic aneurysm 41 mm, essential hypertension, dyslipidemia, breast cancer, recently she ended up having coronary CT angio because of atypical chest pain as well as look at the size of the aneurysm, coronary CT angio suggest potential left main disease, she end up having cardiac catheterization done which showed only 20% left main however there was sharp 10 and kink in the left main coronary artery, left anterior descending artery however showed 80% stenosis, FFR has been done which was negative.  She is coming here today to talk about this.  She is doing well she exercise on the regular basis does not have any chest pain tightness squeezing pressure burning chest no palpitation no dizziness overall cardiac wise seems to be doing well now.  Past Medical History:  Diagnosis Date   Allergy    Sulfa drugs, iodine   Cerumen impaction    chronic   Cervical cancer screening 03/12/2015   Menarche at 10 Regular and moderate flow  history of abnormal pap in past, bx and cryo once many years ago, normal since. Last 2011 and normal G0P0, s/p No history of abnormal MGM Last in 2015, no breast concerns  No concerns today Biopsy, cervical: gyn surgeries Menopause at 53 ish   Conflict between patient and family 03/26/2021   Dysmenorrhea    Heart murmur 03/26/2021   Hematuria 07/31/2017   HPV in female 2019   Hyperglycemia 03/19/2013   Hypertension    Muscle cramping 03/26/2021   Osteopenia    Osteoporosis 04/11/2010    Osteoporosis, compression fracture in 1/24   Other and unspecified hyperlipidemia 03/19/2013   Preventative health care 09/08/2011   Right calf pain 03/22/2021   Skin cancer    Skin cancer 07/14/2016   Uterine fibroid     Past Surgical History:  Procedure Laterality Date   BREAST SURGERY     biopsy left   COLPOSCOPY     CORONARY PRESSURE/FFR STUDY N/A 06/30/2024   Procedure: CORONARY PRESSURE/FFR STUDY;  Surgeon: Mady Bruckner, MD;  [facility]: [facility];  Service: Cardiovascular;  Laterality: N/A;   GYNECOLOGIC CRYOSURGERY     LEFT HEART CATH AND CORONARY ANGIOGRAPHY N/A 06/30/2024   Procedure: LEFT HEART CATH AND CORONARY ANGIOGRAPHY;  Surgeon: Mady Bruckner, MD;  Location: [facility];  Service: Cardiovascular;  Laterality: N/A;   MYRINGOPLASTY  1972   TONSILLECTOMY      Current Medications: Current Meds  Medication Sig   aspirin  EC 81 MG tablet Take 81 mg by mouth daily. Swallow whole.   Ergocalciferol  (VITAMIN D2) 50 MCG (2000 UT) TABS Take 2,000 Units by mouth daily.   hydrochlorothiazide (HYDRODIURIL) 12.5 MG tablet Take 12.5 mg by mouth daily.   metoprolol  succinate (TOPROL  XL) 25 MG 24 hr tablet Take 1 tablet (25 mg total) by mouth daily.   Multiple Vitamins-Minerals (PRESERVISION AREDS 2+MULTI VIT PO) Take 1 tablet by mouth in the morning and at bedtime.   nitroGLYCERIN  (NITROSTAT ) 0.4 MG SL tablet Place 1  tablet (0.4 mg total) under the tongue every 5 (five) minutes as needed for chest pain.   Probiotic Product (PROBIOTIC PO) Take 1 tablet by mouth daily.   tamoxifen (NOLVADEX) 20 MG tablet Take 20 mg by mouth daily.   tretinoin  (RETIN-A ) 0.05 % cream APPLY TOPICALLY AT BEDTIME (Patient taking differently: Apply 1 Application topically 3 (three) times a week.)   Vitamin D , Ergocalciferol , (DRISDOL ) 1.25 MG (50000 UNIT) CAPS capsule Take 1 capsule (50,000 Units total) by mouth every 14 (fourteen) days.   [DISCONTINUED] rosuvastatin  (CRESTOR ) 10 MG  tablet TAKE 1 TABLET BY MOUTH DAILY *PLEASE KEEP SCHEDULED APPOINTMENT FOR MORE REFILLS*     Allergies:   Iodine and Sulfa antibiotics   Social History   Socioeconomic History   Marital status: Married    Spouse name: Not on file   Number of children: Not on file   Years of education: Not on file   Highest education level: Master's degree (e.g., MA, MS, MEng, MEd, MSW, MBA)  Occupational History   Not on file  Tobacco Use   Smoking status: Never   Smokeless tobacco: Never  Vaping Use   Vaping status: Never Used  Substance and Sexual Activity   Alcohol use: Yes    Comment: Less than 1 glass of wine or beer per week   Drug use: Never    Comment: older than 15, less than 5   Sexual activity: Yes    Birth control/protection: Post-menopausal  Other Topics Concern   Not on file  Social History Narrative   Not on file   Social Drivers of Health   Financial Resource Strain: Low Risk  (04/22/2024)   Overall Financial Resource Strain (CARDIA)    Difficulty of Paying Living Expenses: Not hard at all  Food Insecurity: No Food Insecurity (04/22/2024)   Hunger Vital Sign    Worried About Running Out of Food in the Last Year: Never true    Ran Out of Food in the Last Year: Never true  Transportation Needs: No Transportation Needs (04/22/2024)   PRAPARE - Administrator, Civil Service (Medical): No    Lack of Transportation (Non-Medical): No  Physical Activity: Sufficiently Active (04/22/2024)   Exercise Vital Sign    Days of Exercise per Week: 4 days    Minutes of Exercise per Session: 50 min  Stress: No Stress Concern Present (04/22/2024)   [employer] of Occupational Health - Occupational Stress Questionnaire    Feeling of Stress: Only a little  Social Connections: Socially Integrated (04/22/2024)   Social Connection and Isolation Panel    Frequency of Communication with Friends and Family: Twice a week    Frequency of Social Gatherings with Friends and Family: Twice a  week    Attends Religious Services: More than 4 times per year    Active Member of [employer] or Organizations: Yes    Attends Engineer, Structural: More than 4 times per year    Marital Status: Married     Family History: The patient's family history includes Alcohol abuse in her father and another family member; Arthritis in her maternal grandmother and mother; Asthma in her father; Colon cancer in her maternal uncle; Diabetes in her father and sister; Hearing loss in her mother; Heart disease in her father, paternal grandfather, paternal uncle, and sister; Hyperlipidemia in her father, mother, and sister; Hypertension in her father, mother, and sister; Kidney disease in an other family member; Macular degeneration in her mother; Osteoporosis in  her mother and sister; Vision loss in her maternal grandmother and mother. There is no history of Esophageal cancer, Stomach cancer, or Rectal cancer. ROS:   Please see the history of present illness.    All 14 point review of systems negative except as described per history of present illness  EKGs/Labs/Other Studies Reviewed:         Recent Labs: 10/30/2023: Magnesium 1.9 04/23/2024: ALT 9; TSH 4.82 06/27/2024: BUN 15; Creatinine, Ser 0.77; Hemoglobin 14.4; Platelets 330; Potassium 4.2; Sodium 143  Recent Lipid Panel    Component Value Date/Time   CHOL 141 04/23/2024 0858   CHOL 156 06/25/2023 0922   TRIG 96.0 04/23/2024 0858   HDL 56.10 04/23/2024 0858   HDL 53 06/25/2023 0922   CHOLHDL 3 04/23/2024 0858   VLDL 19.2 04/23/2024 0858   LDLCALC 66 04/23/2024 0858   LDLCALC 82 06/25/2023 0922    Physical Exam:    VS:  BP (!) 140/60   Pulse 81   Ht 5' 2.5 (1.588 m)   Wt 127 lb (57.6 kg)   SpO2 99%   BMI 22.86 kg/m     Wt Readings from Last 3 Encounters:  09/15/24 127 lb (57.6 kg)  06/30/24 125 lb (56.7 kg)  06/17/24 125 lb (56.7 kg)     GEN:  Well nourished, well developed in no acute distress HEENT: Normal NECK: No JVD;  No carotid bruits LYMPHATICS: No lymphadenopathy CARDIAC: RRR, holodiastolic murmur 1/6, no rubs, no gallops RESPIRATORY:  Clear to auscultation without rales, wheezing or rhonchi  ABDOMEN: Soft, non-tender, non-distended MUSCULOSKELETAL:  No edema; No deformity  SKIN: Warm and dry LOWER EXTREMITIES: no swelling NEUROLOGIC:  Alert and oriented x 3 PSYCHIATRIC:  Normal affect   ASSESSMENT:    1. Coronary artery disease involving native coronary artery of native heart without angina pectoris   2. Aneurysm of ascending aorta without rupture   3. Hyperlipidemia, mixed    PLAN:    In order of problems listed above:  Coronary disease stable from that point review on guideline directed medical therapy which include antiplatelets therapy as well as statin. Ascending aortic aneurysm measuring 41 mm.  Continue monitoring. Hyperlipidemia, I did review KPN which show me LDL 66 HDL 36.  Encouraged to continue with Crestor  that I will increase to 20 mg daily, fasting lipid profile will be done in 6 weeks.  I Aortic insufficiency, will require echocardiogram later   Medication Adjustments/Labs and Tests Ordered: Current medicines are reviewed at length with the patient today.  Concerns regarding medicines are outlined above.  No orders of the defined types were placed in this encounter.  Medication changes: No orders of the defined types were placed in this encounter.   Signed, Lamar DOROTHA Fitch, MD, [facility] 09/15/2024 4:12 PM    [facility]

## 2024-09-15 NOTE — Patient Instructions (Addendum)
 Medication Instructions:   INCREASE: Crestor  to 20mg  daily   Lab Work: 3rd Floor   [address]  Your physician recommends that you return for lab work in:   6 weeks You need to have labs done when you are fasting.  You can come Monday through Friday 8:00 am to 11:30AM and 1:00 to 4:00. You do not need to make an appointment as the order has already been placed.    Testing/Procedures: None Ordered   Follow-Up: At [facility], you and your health needs are our priority.  As part of our continuing mission to provide you with exceptional heart care, we have created designated Provider Care Teams.  These Care Teams include your primary Cardiologist (physician) and Advanced Practice Providers (APPs -  Physician Assistants and Nurse Practitioners) who all work together to provide you with the care you need, when you need it.  We recommend signing up for the patient portal called MyChart.  Sign up information is provided on this After Visit Summary.  MyChart is used to connect with patients for Virtual Visits (Telemedicine).  Patients are able to view lab/test results, encounter notes, upcoming appointments, etc.  Non-urgent messages can be sent to your provider as well.   To learn more about what you can do with MyChart, go to [email or web address].    Your next appointment:   6 month(s)  The format for your next appointment:   In Person  Provider:   Lamar Fitch, MD    Other Instructions NA

## 2024-10-14 ENCOUNTER — Ambulatory Visit (INDEPENDENT_AMBULATORY_CARE_PROVIDER_SITE_OTHER)

## 2024-10-14 VITALS — Ht 62.5 in | Wt 127.0 lb

## 2024-10-14 DIAGNOSIS — Z0000 Encounter for general adult medical examination without abnormal findings: Secondary | ICD-10-CM | POA: Diagnosis not present

## 2024-10-14 NOTE — Progress Notes (Signed)
 "  Chief Complaint  Patient presents with   Medicare Wellness     Subjective:   Yolanda Hernandez is a 68 y.o. female who presents for a Medicare Annual Wellness Visit.  Visit info / Clinical Intake: Medicare Wellness Visit Type:: Subsequent Annual Wellness Visit Persons participating in visit and providing information:: patient Medicare Wellness Visit Mode:: Telephone If telephone:: video declined Since this visit was completed virtually, some vitals may be partially provided or unavailable. Missing vitals are due to the limitations of the virtual format.: Documented vitals are patient reported If Telephone or Video please confirm:: I connected with patient using audio/video enable telemedicine. I verified patient identity with two identifiers, discussed telehealth limitations, and patient agreed to proceed. Patient Location:: Home Provider Location:: Office Interpreter Needed?: No Pre-visit prep was completed: yes AWV questionnaire completed by patient prior to visit?: yes Date:: 10/13/24 Living arrangements:: lives with spouse/significant other Patient's Overall Health Status Rating: good Typical amount of pain: some Does pain affect daily life?: no Are you currently prescribed opioids?: no  Dietary Habits and Nutritional Risks How many meals a day?: 3 Eats fruit and vegetables daily?: yes Most meals are obtained by: preparing own meals In the last 2 weeks, have you had any of the following?: none Diabetic:: no  Functional Status Activities of Daily Living (to include ambulation/medication): Independent Ambulation: Independent with device- listed below Home Assistive Devices/Equipment: Eyeglasses; Other (Comment) (Hearing Aids) Medication Administration: Independent Home Management (perform basic housework or laundry): Independent Manage your own finances?: yes Primary transportation is: driving Concerns about vision?: no *vision screening is required for WTM* Concerns  about hearing?: (!) yes Uses hearing aids?: (!) yes Hear whispered voice?: (!) no *in-person visit only*  Fall Screening Falls in the past year?: 0 Number of falls in past year: 0 Was there an injury with Fall?: 0 Fall Risk Category Calculator: 0 Patient Fall Risk Level: Low Fall Risk  Fall Risk Patient at Risk for Falls Due to: No Fall Risks Fall risk Follow up: Falls evaluation completed  Home and Transportation Safety: All rugs have non-skid backing?: yes All stairs or steps have railings?: yes Grab bars in the bathtub or shower?: yes Have non-skid surface in bathtub or shower?: yes Good home lighting?: yes Regular seat belt use?: yes Hospital stays in the last year:: no  Cognitive Assessment Difficulty concentrating, remembering, or making decisions? : no Will 6CIT or Mini Cog be Completed: yes What year is it?: 0 points What month is it?: 0 points Give patient an address phrase to remember (5 components): [address]  [place]  About what time is it?: 0 points Count backwards from 20 to 1: 0 points Say the months of the year in reverse: 0 points Repeat the address phrase from earlier: 0 points 6 CIT Score: 0 points  Advance Directives (For Healthcare) Does Patient Have a Medical Advance Directive?: Yes Does patient want to make changes to medical advance directive?: No - Patient declined Type of Advance Directive: Healthcare Power of [place]; Living will Copy of Healthcare Power of Attorney in Chart?: No - copy requested Copy of Living Will in Chart?: No - copy requested  Reviewed/Updated  Reviewed/Updated: Reviewed All (Medical, Surgical, Family, Medications, Allergies, Care Teams, Patient Goals)    Allergies (verified) Iodine and Sulfa antibiotics   Current Medications (verified) Outpatient Encounter Medications as of 10/14/2024  Medication Sig   aspirin  EC 81 MG tablet Take 81 mg by mouth daily. Swallow whole.   Ergocalciferol  (VITAMIN D2)  50 MCG  (2000 UT) TABS Take 2,000 Units by mouth daily.   hydrochlorothiazide (HYDRODIURIL) 12.5 MG tablet Take 12.5 mg by mouth daily.   metoprolol  succinate (TOPROL  XL) 25 MG 24 hr tablet Take 1 tablet (25 mg total) by mouth daily.   Multiple Vitamins-Minerals (PRESERVISION AREDS 2+MULTI VIT PO) Take 1 tablet by mouth in the morning and at bedtime.   nitroGLYCERIN  (NITROSTAT ) 0.4 MG SL tablet Place 1 tablet (0.4 mg total) under the tongue every 5 (five) minutes as needed for chest pain.   Probiotic Product (PROBIOTIC PO) Take 1 tablet by mouth daily.   rosuvastatin  (CRESTOR ) 20 MG tablet Take 1 tablet (20 mg total) by mouth daily.   tamoxifen (NOLVADEX) 20 MG tablet Take 20 mg by mouth daily.   tretinoin  (RETIN-A ) 0.05 % cream APPLY TOPICALLY AT BEDTIME (Patient taking differently: Apply 1 Application topically 3 (three) times a week.)   Vitamin D , Ergocalciferol , (DRISDOL ) 1.25 MG (50000 UNIT) CAPS capsule Take 1 capsule (50,000 Units total) by mouth every 14 (fourteen) days.   No facility-administered encounter medications on file as of 10/14/2024.    History: Past Medical History:  Diagnosis Date   Allergy    Sulfa drugs, iodine   Cerumen impaction    chronic   Cervical cancer screening 03/12/2015   Menarche at 10 Regular and moderate flow  history of abnormal pap in past, bx and cryo once many years ago, normal since. Last 2011 and normal G0P0, s/p No history of abnormal MGM Last in 2015, no breast concerns  No concerns today Biopsy, cervical: gyn surgeries Menopause at 53 ish   Conflict between patient and family 03/26/2021   Dysmenorrhea    Heart murmur 03/26/2021   Hematuria 07/31/2017   HPV in female 2019   Hyperglycemia 03/19/2013   Hypertension    Muscle cramping 03/26/2021   Osteopenia    Osteoporosis 04/11/2010   Osteoporosis, compression fracture in 1/24   Other and unspecified hyperlipidemia 03/19/2013   Preventative health care 09/08/2011   Right calf pain 03/22/2021    Skin cancer    Skin cancer 07/14/2016   Uterine fibroid    Past Surgical History:  Procedure Laterality Date   BREAST SURGERY     biopsy left   COLPOSCOPY     CORONARY PRESSURE/FFR STUDY N/A 06/30/2024   Procedure: CORONARY PRESSURE/FFR STUDY;  Surgeon: Mady Bruckner, MD;  Location: [facility];  Service: Cardiovascular;  Laterality: N/A;   GYNECOLOGIC CRYOSURGERY     LEFT HEART CATH AND CORONARY ANGIOGRAPHY N/A 06/30/2024   Procedure: LEFT HEART CATH AND CORONARY ANGIOGRAPHY;  Surgeon: Mady Bruckner, MD;  Location: [facility];  Service: Cardiovascular;  Laterality: N/A;   MYRINGOPLASTY  1972   TONSILLECTOMY     Family History  Problem Relation Age of Onset   Osteoporosis Mother    Hyperlipidemia Mother    Hypertension Mother    Macular degeneration Mother    Arthritis Mother    Hearing loss Mother    Vision loss Mother    Heart disease Father    Diabetes Father    Hyperlipidemia Father    Hypertension Father    Asthma Father    Alcohol abuse Father    Diabetes Sister    Heart disease Sister        s/p cardiac stent   Hyperlipidemia Sister    Hypertension Sister    Osteoporosis Sister    Colon cancer Maternal Uncle    Heart disease Paternal  Uncle    Arthritis Maternal Grandmother    Vision loss Maternal Grandmother    Heart disease Paternal Grandfather    Kidney disease Other    Alcohol abuse Other    Esophageal cancer Neg Hx    Stomach cancer Neg Hx    Rectal cancer Neg Hx    Social History   Occupational History   Not on file  Tobacco Use   Smoking status: Never   Smokeless tobacco: Never  Vaping Use   Vaping status: Never Used  Substance and Sexual Activity   Alcohol use: Yes    Comment: Less than 1 glass of wine or beer per week   Drug use: Never    Comment: older than 15, less than 5   Sexual activity: Yes    Birth control/protection: Post-menopausal   Tobacco Counseling Counseling given: No  SDOH Screenings   Food  Insecurity: No Food Insecurity (10/14/2024)  Housing: Low Risk (10/14/2024)  Transportation Needs: No Transportation Needs (10/14/2024)  Utilities: Not At Risk (10/14/2024)  Alcohol Screen: Low Risk (10/13/2024)  Depression (PHQ2-9): Low Risk (10/14/2024)  Financial Resource Strain: Low Risk (10/13/2024)  Physical Activity: Sufficiently Active (10/14/2024)  Social Connections: Socially Integrated (10/14/2024)  Stress: No Stress Concern Present (10/14/2024)  Tobacco Use: Low Risk (10/14/2024)  Health Literacy: Adequate Health Literacy (10/14/2024)   See flowsheets for full screening details  Depression Screen PHQ 2 & 9 Depression Scale- Over the past 2 weeks, how often have you been bothered by any of the following problems? Little interest or pleasure in doing things: 0 Feeling down, depressed, or hopeless (PHQ Adolescent also includes...irritable): 0 PHQ-2 Total Score: 0     Goals Addressed               This Visit's Progress     Increase bone health (pt-stated)        Remain active.             Objective:    Today's Vitals   10/14/24 0841  Weight: 127 lb (57.6 kg)  Height: 5' 2.5 (1.588 m)   Body mass index is 22.86 kg/m.  Hearing/Vision screen Hearing Screening - Comments:: Wears Hearing Aids Vision Screening - Comments:: Wears rx glasses - up to date with routine eye exams with  [facility] Immunizations and Health Maintenance Health Maintenance  Topic Date Due   COVID-19 Vaccine (12 - Pfizer risk 2025-26 season) 12/14/2024   Mammogram  05/07/2025   Medicare Annual Wellness (AWV)  10/14/2025   DTaP/Tdap/Td (3 - Td or Tdap) 08/22/2028   Colonoscopy  07/27/2032   Pneumococcal Vaccine: 50+ Years  Completed   Influenza Vaccine  Completed   Bone Density Scan  Completed   Hepatitis C Screening  Completed   Zoster Vaccines- Shingrix  Completed   Meningococcal B Vaccine  Aged Out        Assessment/Plan:  This is a routine wellness examination for  Yolanda Hernandez.  Patient Care Team: Domenica Harlene LABOR, MD as PCP - General (Family Medicine) Jannis Kate Norris, MD as Consulting Physician (Obstetrics and Gynecology)  I have personally reviewed and noted the following in the patients chart:   Medical and social history Use of alcohol, tobacco or illicit drugs  Current medications and supplements including opioid prescriptions. Functional ability and status Nutritional status Physical activity Advanced directives List of other physicians Hospitalizations, surgeries, and ER visits in previous 12 months Vitals Screenings to include cognitive, depression, and falls Referrals and appointments  No orders  of the defined types were placed in this encounter.  In addition, I have reviewed and discussed with patient certain preventive protocols, quality metrics, and best practice recommendations. A written personalized care plan for preventive services as well as general preventive health recommendations were provided to patient.   Yolanda LELON Blush, LPN   [id number]   Return in 53 weeks (on 10/20/2025).  After Visit Summary: (MyChart) Due to this being a telephonic visit, the after visit summary with patients personalized plan was offered to patient via MyChart   Nurse Notes: No voiced or noted concerns at this time "

## 2024-10-14 NOTE — Patient Instructions (Addendum)
 Yolanda Hernandez,  Thank you for taking the time for your Medicare Wellness Visit. I appreciate your continued commitment to your health goals. Please review the care plan we discussed, and feel free to reach out if I can assist you further.  Please note that Annual Wellness Visits do not include a physical exam. Some assessments may be limited, especially if the visit was conducted virtually. If needed, we may recommend an in-person follow-up with your provider.  Ongoing Care Seeing your primary care provider every 3 to 6 months helps us  monitor your health and provide consistent, personalized care.   Referrals If a referral was made during today's visit and you haven't received any updates within two weeks, please contact the referred provider directly to check on the status.  Recommended Screenings:  Health Maintenance  Topic Date Due   COVID-19 Vaccine (12 - Pfizer risk 2025-26 season) 12/14/2024   Breast Cancer Screening  05/07/2025   Medicare Annual Wellness Visit  10/14/2025   DTaP/Tdap/Td vaccine (3 - Td or Tdap) 08/22/2028   Colon Cancer Screening  07/27/2032   Pneumococcal Vaccine for age over 50  Completed   Flu Shot  Completed   Osteoporosis screening with Bone Density Scan  Completed   Hepatitis C Screening  Completed   Zoster (Shingles) Vaccine  Completed   Meningitis B Vaccine  Aged Out       10/14/2024    8:42 AM  Advanced Directives  Does Patient Have a Medical Advance Directive? Yes  Type of Estate Agent of [place];Living will  Does patient want to make changes to medical advance directive? No - Patient declined  Copy of Healthcare Power of Attorney in Chart? No - copy requested    Vision: Annual vision screenings are recommended for early detection of glaucoma, cataracts, and diabetic retinopathy. These exams can also reveal signs of chronic conditions such as diabetes and high blood pressure.  Dental: Annual dental screenings help detect  early signs of oral cancer, gum disease, and other conditions linked to overall health, including heart disease and diabetes.  Please see the attached documents for additional preventive care recommendations.

## 2024-10-22 ENCOUNTER — Ambulatory Visit: Payer: Self-pay | Admitting: Obstetrics and Gynecology

## 2024-10-23 ENCOUNTER — Telehealth: Payer: Self-pay

## 2024-10-23 NOTE — Telephone Encounter (Signed)
 Attempted to reach patient concerning colonoscopy recall; unable to speak with patient;  left message and number to the office for patient to call back and schedule appts;

## 2024-11-03 ENCOUNTER — Encounter: Payer: Medicare Other | Admitting: Family Medicine

## 2024-11-13 NOTE — Telephone Encounter (Signed)
 Error

## 2024-12-04 ENCOUNTER — Encounter: Admitting: Family Medicine

## 2024-12-04 ENCOUNTER — Encounter: Payer: Medicare Other | Admitting: Family Medicine

## 2024-12-11 ENCOUNTER — Encounter: Admitting: Student

## 2025-08-11 ENCOUNTER — Encounter: Admitting: Family Medicine

## 2025-10-20 ENCOUNTER — Ambulatory Visit
